# Patient Record
Sex: Female | Born: 1965 | Race: White | Hispanic: No | Smoking: Former smoker
Health system: Southern US, Community
[De-identification: ages and names within clinical notes are randomized; demographics above are authoritative.]

## PROBLEM LIST (undated history)

## (undated) DIAGNOSIS — I10 Essential (primary) hypertension: Secondary | ICD-10-CM

## (undated) DIAGNOSIS — Z86718 Personal history of other venous thrombosis and embolism: Secondary | ICD-10-CM

## (undated) DIAGNOSIS — K219 Gastro-esophageal reflux disease without esophagitis: Secondary | ICD-10-CM

## (undated) DIAGNOSIS — F32A Depression, unspecified: Secondary | ICD-10-CM

## (undated) DIAGNOSIS — J45909 Unspecified asthma, uncomplicated: Secondary | ICD-10-CM

## (undated) DIAGNOSIS — F419 Anxiety disorder, unspecified: Secondary | ICD-10-CM

## (undated) DIAGNOSIS — R918 Other nonspecific abnormal finding of lung field: Secondary | ICD-10-CM

## (undated) DIAGNOSIS — F329 Major depressive disorder, single episode, unspecified: Secondary | ICD-10-CM

## (undated) HISTORY — PX: WISDOM TOOTH EXTRACTION: SHX21

## (undated) HISTORY — PX: OTHER SURGICAL HISTORY: SHX169

## (undated) HISTORY — PX: POLYPECTOMY: SHX149

## (undated) HISTORY — PX: ABDOMINAL HYSTERECTOMY: SHX81

## (undated) HISTORY — DX: Depression, unspecified: F32.A

## (undated) HISTORY — PX: CHOLECYSTECTOMY: SHX55

## (undated) HISTORY — DX: Essential (primary) hypertension: I10

## (undated) HISTORY — DX: Other nonspecific abnormal finding of lung field: R91.8

## (undated) HISTORY — PX: ECTOPIC PREGNANCY SURGERY: SHX613

## (undated) HISTORY — PX: CARPAL TUNNEL RELEASE: SHX101

## (undated) HISTORY — PX: FOOT SURGERY: SHX648

## (undated) HISTORY — PX: TOTAL HIP ARTHROPLASTY: SHX124

## (undated) HISTORY — DX: Gastro-esophageal reflux disease without esophagitis: K21.9

## (undated) HISTORY — DX: Personal history of other venous thrombosis and embolism: Z86.718

## (undated) HISTORY — DX: Unspecified asthma, uncomplicated: J45.909

## (undated) HISTORY — DX: Anxiety disorder, unspecified: F41.9

---
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
## 2011-02-11 ENCOUNTER — Other Ambulatory Visit (HOSPITAL_COMMUNITY): Payer: Self-pay | Admitting: Neurosurgery

## 2011-02-11 ENCOUNTER — Encounter (HOSPITAL_COMMUNITY)
Admission: RE | Admit: 2011-02-11 | Discharge: 2011-02-11 | Disposition: A | Discharge status: Home / Self Care | Payer: BC Managed Care – PPO | Source: Ambulatory Visit | Attending: Neurosurgery | Admitting: Neurosurgery

## 2011-02-11 ENCOUNTER — Ambulatory Visit (HOSPITAL_COMMUNITY)
Admission: RE | Admit: 2011-02-11 | Discharge: 2011-02-11 | Disposition: A | Discharge status: Home / Self Care | Payer: BC Managed Care – PPO | Source: Ambulatory Visit | Attending: Neurosurgery | Admitting: Neurosurgery

## 2011-02-11 DIAGNOSIS — Z01818 Encounter for other preprocedural examination: Secondary | ICD-10-CM | POA: Insufficient documentation

## 2011-02-11 DIAGNOSIS — Z01810 Encounter for preprocedural cardiovascular examination: Secondary | ICD-10-CM | POA: Insufficient documentation

## 2011-02-11 DIAGNOSIS — Z01812 Encounter for preprocedural laboratory examination: Secondary | ICD-10-CM | POA: Insufficient documentation

## 2011-02-11 DIAGNOSIS — M47812 Spondylosis without myelopathy or radiculopathy, cervical region: Secondary | ICD-10-CM | POA: Insufficient documentation

## 2011-02-11 LAB — BASIC METABOLIC PANEL
BUN: 12 mg/dL (ref 6–23)
CO2: 27 mEq/L (ref 19–32)
Calcium: 9.2 mg/dL (ref 8.4–10.5)
Chloride: 101 mEq/L (ref 96–112)
Creatinine, Ser: 0.8 mg/dL (ref 0.50–1.10)
GFR calc Af Amer: >60 mL/min (ref >60)
GFR calc non Af Amer: >60 mL/min (ref >60)
Glucose, Bld: 93 mg/dL (ref 70–99)
Potassium: 3.6 mEq/L (ref 3.5–5.1)
Sodium: 138 mEq/L (ref 135–145)

## 2011-02-11 LAB — CBC
HCT: 37.6 % (ref 36.0–46.0)
Hemoglobin: 13.2 g/dL (ref 12.0–15.0)
MCH: 31.7 pg (ref 26.0–34.0)
MCHC: 35.1 g/dL (ref 30.0–36.0)
MCV: 90.4 fL (ref 78.0–100.0)
Platelets: 353 10*3/uL (ref 150–400)
RBC: 4.16 MIL/uL (ref 3.87–5.11)
RDW: 12.5 % (ref 11.5–15.5)
WBC: 7.8 10*3/uL (ref 4.0–10.5)

## 2011-02-11 LAB — HCG, SERUM, QUALITATIVE: Preg, Serum: NEGATIVE

## 2011-02-11 LAB — SURGICAL PCR SCREEN
MRSA, PCR: NEGATIVE
Staphylococcus aureus: POSITIVE — AB

## 2011-02-21 ENCOUNTER — Inpatient Hospital Stay (HOSPITAL_COMMUNITY): Payer: BC Managed Care – PPO

## 2011-02-21 ENCOUNTER — Inpatient Hospital Stay (HOSPITAL_COMMUNITY)
Admission: RE | Admit: 2011-02-21 | Discharge: 2011-02-22 | DRG: 865 | Disposition: A | Discharge status: Home / Self Care | Payer: BC Managed Care – PPO | Source: Ambulatory Visit | Attending: Neurosurgery | Admitting: Neurosurgery

## 2011-02-21 DIAGNOSIS — I10 Essential (primary) hypertension: Secondary | ICD-10-CM | POA: Diagnosis present

## 2011-02-21 DIAGNOSIS — I252 Old myocardial infarction: Secondary | ICD-10-CM

## 2011-02-21 DIAGNOSIS — K219 Gastro-esophageal reflux disease without esophagitis: Secondary | ICD-10-CM | POA: Diagnosis present

## 2011-02-21 DIAGNOSIS — M5030 Other cervical disc degeneration, unspecified cervical region: Secondary | ICD-10-CM | POA: Diagnosis present

## 2011-02-21 DIAGNOSIS — G5600 Carpal tunnel syndrome, unspecified upper limb: Secondary | ICD-10-CM | POA: Diagnosis present

## 2011-02-21 DIAGNOSIS — Z01812 Encounter for preprocedural laboratory examination: Secondary | ICD-10-CM

## 2011-02-21 DIAGNOSIS — IMO0002 Reserved for concepts with insufficient information to code with codable children: Secondary | ICD-10-CM | POA: Diagnosis present

## 2011-02-21 DIAGNOSIS — M47812 Spondylosis without myelopathy or radiculopathy, cervical region: Principal | ICD-10-CM | POA: Diagnosis present

## 2011-02-21 DIAGNOSIS — F17200 Nicotine dependence, unspecified, uncomplicated: Secondary | ICD-10-CM | POA: Diagnosis present

## 2011-02-21 DIAGNOSIS — G9741 Accidental puncture or laceration of dura during a procedure: Secondary | ICD-10-CM | POA: Diagnosis present

## 2011-02-21 DIAGNOSIS — Z8673 Personal history of transient ischemic attack (TIA), and cerebral infarction without residual deficits: Secondary | ICD-10-CM

## 2011-02-21 IMAGING — CR DG CERVICAL SPINE 2 OR 3 VIEWS
1 series · 1 of 1 positions shown · non-contrast
Comparison: None.

CLINICAL DATA: 45-year-old female undergoing cervical spine
surgery.

CERVICAL SPINE - 2-3 VIEW

[view not recorded]
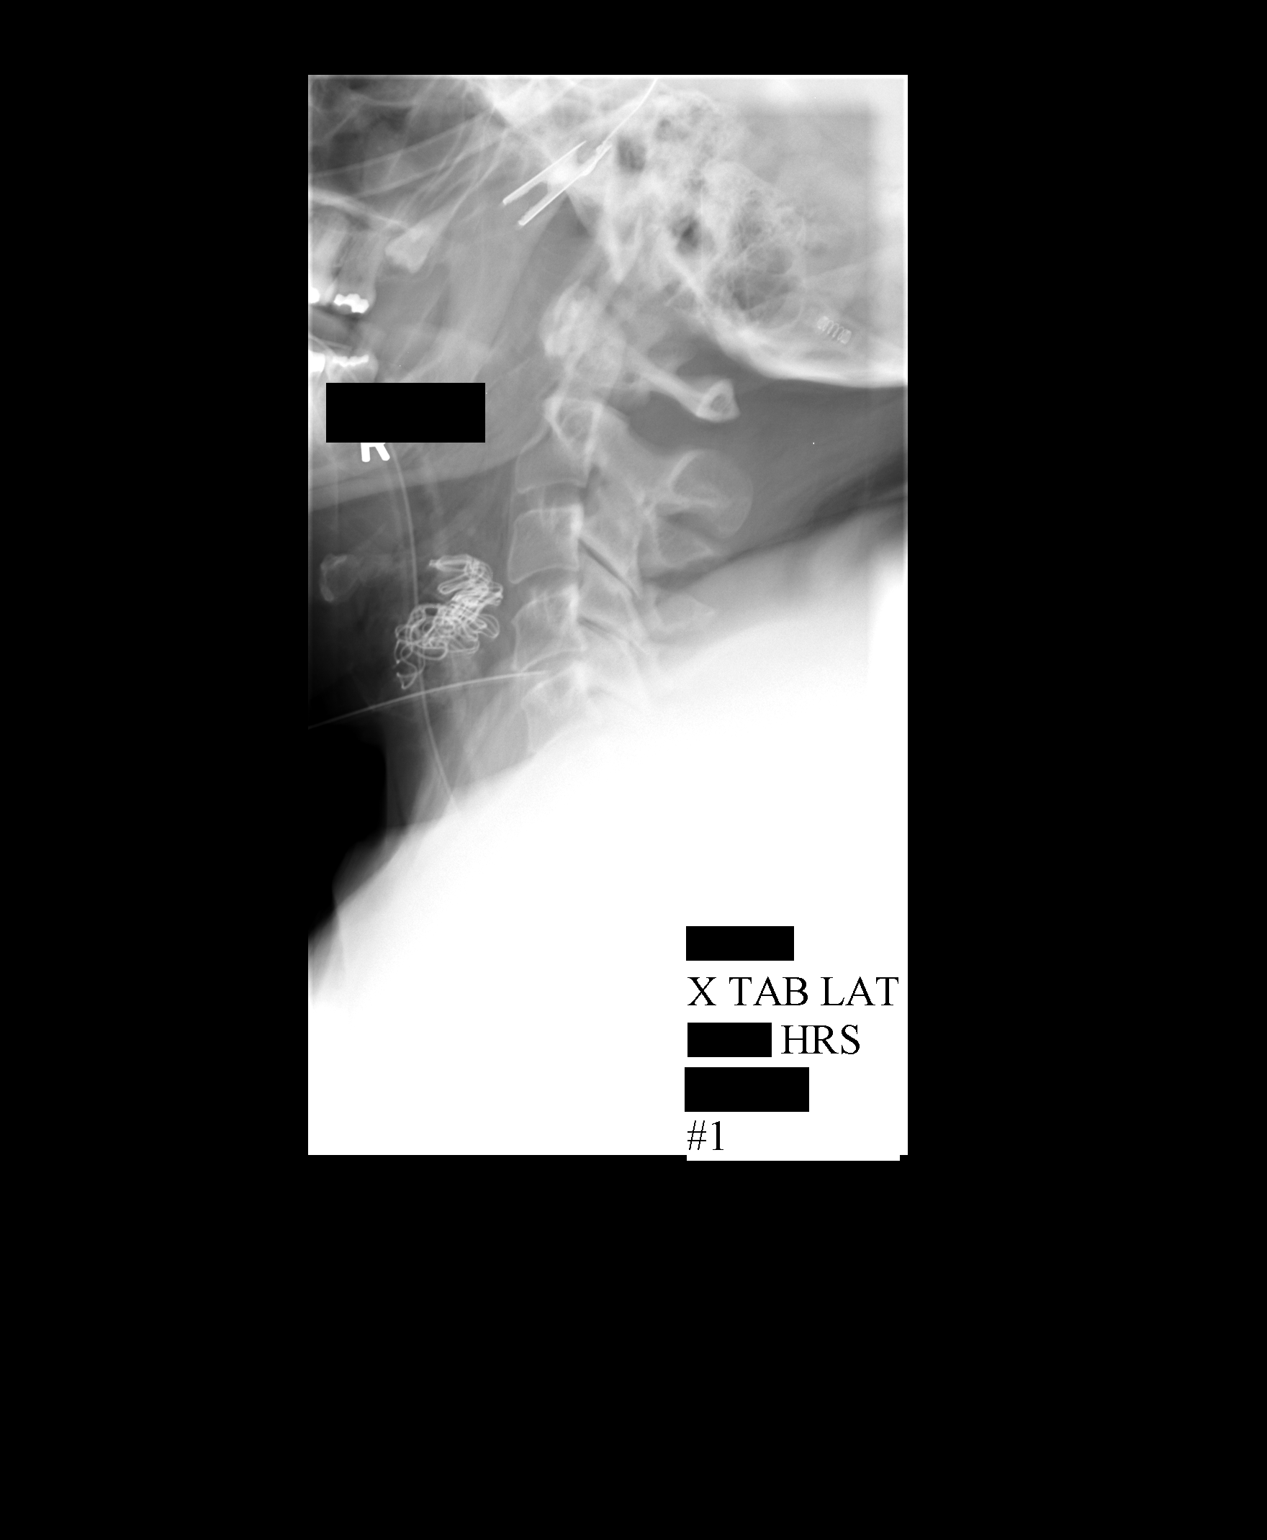

[1 of 1 positions shown; findings below may reference images not displayed]

FINDINGS: 3 intraoperative portable cross-table lateral views of
the cervical spine.

Film #1 [R1] hours.  Needle directed into the C4-C5 disc space.

Film #2 at [R1] hours.  Anterior fusion plate projects from C4
caudally below the C6 level, which is under penetrated.  Disc
spacers in place at C4-C5 and C5-C6.

Film #3 at [R1] hours.  Cortical screw is now in place with
complete ACDF hardware at C4-C5 and C5-C6.  The levels below C6 are
not penetrated.
IMPRESSION: ACDF at C4-C5 and C5-C6, and probably extending caudally to C7 but
that level is inadequately penetrated.

## 2011-02-23 ENCOUNTER — Emergency Department (HOSPITAL_COMMUNITY)
Admission: EM | Admit: 2011-02-23 | Discharge: 2011-02-23 | Disposition: A | Discharge status: Home / Self Care | Payer: BC Managed Care – PPO | Attending: Emergency Medicine | Admitting: Emergency Medicine

## 2011-02-23 DIAGNOSIS — I10 Essential (primary) hypertension: Secondary | ICD-10-CM | POA: Insufficient documentation

## 2011-02-23 DIAGNOSIS — G8918 Other acute postprocedural pain: Secondary | ICD-10-CM | POA: Insufficient documentation

## 2011-02-23 DIAGNOSIS — L2989 Other pruritus: Secondary | ICD-10-CM | POA: Insufficient documentation

## 2011-02-23 DIAGNOSIS — R6889 Other general symptoms and signs: Secondary | ICD-10-CM | POA: Insufficient documentation

## 2011-02-23 DIAGNOSIS — R220 Localized swelling, mass and lump, head: Secondary | ICD-10-CM | POA: Insufficient documentation

## 2011-02-23 DIAGNOSIS — T40605A Adverse effect of unspecified narcotics, initial encounter: Secondary | ICD-10-CM | POA: Insufficient documentation

## 2011-02-23 DIAGNOSIS — L298 Other pruritus: Secondary | ICD-10-CM | POA: Insufficient documentation

## 2011-02-26 NOTE — H&P (Signed)
  [name], [name] NO.:  [id number]  MEDICAL RECORD NO.:  [id number]  LOCATION:  2899                         FACILITY:  [facility]  PHYSICIAN:  Hilda Lias, M.D.   DATE OF BIRTH:  01-Mar-1966  DATE OF ADMISSION:  02/21/2011 DATE OF DISCHARGE:                             HISTORY & PHYSICAL   HISTORY OF PRESENT ILLNESS:  [name] is a lady who was seen by me 3 months ago complaining of neck pain radiating to both upper extremities and complains back pain treated conservatively.  Lately, she has noticed also pain in both hands.  She is quite pleasant.  She had conservative treatment including therapies, medications, and she is not any better. Because of gradual weakness and findings, she was sent to [place] for further evaluation.  PAST MEDICAL HISTORY:  She had a heart catheterization, cholecystectomy, and foot surgery.  MEDICATIONS:  Demerol and some blood pressure medications.  ALLERGIES:  She as per report denies any allergies.  SOCIAL HISTORY:  The patient does not drink.  She smokes.  She is 232 pounds and 5 feet 2 inches.  FAMILY HISTORY:  Father died at the age of 71 with acute MI.  REVIEW OF SYSTEMS:  Positive for history of MI about 3 years ago, history of back pain and blood pressure, urinary incontinence, liver cirrhosis.  PHYSICAL EXAMINATION:  HEAD, EARS, NOSE, AND THROAT:  Normal. NECK:  She has decreased flexibility. LUNGS:  Clear. HEART:  Heart sounds normal. ABDOMEN:  Normal. EXTREMITIES:  Normal pulses. NEUROLOGIC:  She has weakness of the biceps and triceps.  She has mild weakness in the deltoids and weakness in intrinsic muscles in the right side worse than the left side.  IMAGING:  A MRI showed spondylosis at the level of C4-C5, C5-C6, and C6- C7 with subluxation at C4-C5.  The nerve conduction test showed that she has carpal tunnel syndrome bilaterally, right side worse than the left side.  CLINICAL IMPRESSION:  Cervical  spondylosis, C4-C7 with bilateral carpal tunnel syndrome, right side worse than the left side.  RECOMMENDATIONS:  The patient is being admitted for surgery.  The procedure will be anterior diskectomy and fusion at the level of C4-C5, C5-C6, and C6-C7.  Then we are going to go ahead and decompress the right median nerve.  The surgery was fully explained to her including the possibility of no improvement, need for further surgery, infection, CSF leak, and paralysis.          ______________________________ Hilda Lias, M.D.     EB/MEDQ  D:  02/21/2011  T:  02/21/2011  Job:  [id number]  Electronically Signed by Hilda Lias M.D. on 02/26/2011 07:05:36 PM

## 2011-02-26 NOTE — Op Note (Signed)
[name], [name] NO.:  [id number]  MEDICAL RECORD NO.:  [id number]  LOCATION:  3009                         FACILITY:  [facility]  PHYSICIAN:  Hilda Lias, M.D.   DATE OF BIRTH:  February 18, 1966  DATE OF PROCEDURE:  02/21/2011 DATE OF DISCHARGE:                              OPERATIVE REPORT   PREOPERATIVE DIAGNOSIS:  C4-C5, C5-C6, C6-C7 spondylosis with chronic radiculopathy, bilateral weakness of the deltoid and biceps.  Bilateral carpal tunnel syndrome, right worse than left one.  POSTOPERATIVE DIAGNOSES:  C4-C5, C5-C6, C6-C7 spondylosis with chronic radiculopathy, bilateral weakness of the deltoid and biceps.  Bilateral carpal tunnel syndrome, right worse than left one.  PROCEDURES: 1. First stage anterior C4-C5, C5-C6, C6-C7 diskectomy, decompression     of the spinal cord, interbody fusion with cages and plate. 2. Decompression of the right median nerve.  Microscope.  SURGEON:  Hilda Lias, MD  ASSISTANT:  Hewitt Shorts, MD, for the cervical area.  CLINICAL HISTORY:  [name] is 44 years old complaining of neck pain with radiation to both upper extremity associated with weakness at deltoid and biceps.  Also, she had complained of numbness in both hands. MRI shows stenosis at the level of C4-C5, C5-C6, C6-C7.  Nerve conduction test showed bilateral carpal tunnel syndrome, right worse than left one.  The surgery was advised and the patient was aware of the risks of the surgery.  PROCEDURE IN DETAIL:  The patient was taken to the OR and after intubation, we had to use a quite a bit of blanket underneath the shoulder just to be able to retract the neck.  The patient is 5 feet 2 inches and 210 pounds and it was difficult to obtain a space between the head and the shoulder.  Nevertheless, after we used the blanket, we had quite acceptable space.  Then the area was cleaned with DuraPrep. Drapes were applied.  Transverse incision was made through  the skin and subcutaneous tissue.  Retraction was carried out and we went straight down to the cervical spine.  X-rays showed that indeed we were at the level of C4-C5 by x-ray.  From then on, we started dissecting and we brought the microscope.  We started at the level of C6-C7.  An incision was made after we removed a large anterior osteophyte.  Incision was carried out through the anterior ligaments.  Degenerative disk disease was removed.  We brought the microscope into the area.  We drilled the posterior element of the vertebral body of C6-C7 and we opened the posterior ligament.  Decompression of the spinal cord as well both C7 nerve root was done.  The same procedure was done essentially at the level of C5-C6 with the same findings of degenerative disk disease and narrowing.  At the end, we had a good decompression of the spinal cord at the level of C5-C6 with good space for C6 nerve root.  At the level of C4-C5, it was quite difficult.  The patient had quite a bit of degenerative disk disease and once we reached the posterior ligament, we found the ligament was completely calcified.  It was difficult to dissect the ligament away  from the spinal cord.  We opened laterally and we started dissecting toward the midline.  The ligament was also adherent to dura mater.  During the procedure, there was a small opening in the dura mater which was taken care with Duragen and Tisseel.  We continued our decompression through the midline.  Once we reached the left side, the patient has a large calcified disk which also was removed.  At the end, we had plenty of room for the spinal cord at this level as well as good space for the foramen.  Then 3 cages of 7 mm height, lordotic with local autograft as well as the DBX were inserted followed by a plate using 8 screws.  Lateral cervical spine x-rays showed good position of the cages as well as the plate.  The area was irrigated.  We waited 5  minutes just to be sure that we had good hemostasis and then the wound was closed with Vicryl and Steri-Strips. After that, the drapes were removed.  We proceeded with a second stage.  Second stage.  The right hand and arm were prepped with DuraPrep and drapes were applied to the area.  The incision followed by the patient's skin, subcutaneous tissue up to thick volar ligament was done.  Then we found the carpal ligament which was really thick and calcified.  With the knife, we opened the midline and protecting the median nerve we did decompression, proximal and distal.  Because of the thickened ligament, we had to dissect the lateral aspect of the ligament.  Hemostasis was accomplished with bipolar.  The wound was irrigated.  After that, we closed the wound using chromic stitches of 4-0 nylon.  The patient did well and she is going to go to PACU.          ______________________________ Hilda Lias, M.D.     EB/MEDQ  D:  02/21/2011  T:  02/21/2011  Job:  [id number]  Electronically Signed by Hilda Lias M.D. on 02/26/2011 07:06:58 PM

## 2011-03-07 DIAGNOSIS — R918 Other nonspecific abnormal finding of lung field: Secondary | ICD-10-CM | POA: Insufficient documentation

## 2011-03-07 DIAGNOSIS — I10 Essential (primary) hypertension: Secondary | ICD-10-CM

## 2011-03-07 DIAGNOSIS — F419 Anxiety disorder, unspecified: Secondary | ICD-10-CM | POA: Insufficient documentation

## 2011-03-11 ENCOUNTER — Ambulatory Visit (INDEPENDENT_AMBULATORY_CARE_PROVIDER_SITE_OTHER): Payer: BC Managed Care – PPO | Admitting: Thoracic Surgery

## 2011-03-11 ENCOUNTER — Encounter: Payer: Self-pay | Admitting: Thoracic Surgery

## 2011-03-11 VITALS — BP 133/92 | HR 116 | Resp 20 | Ht 62.0 in | Wt 225.0 lb

## 2011-03-11 DIAGNOSIS — R918 Other nonspecific abnormal finding of lung field: Secondary | ICD-10-CM

## 2011-03-11 DIAGNOSIS — R9389 Abnormal findings on diagnostic imaging of other specified body structures: Secondary | ICD-10-CM

## 2011-03-11 NOTE — Progress Notes (Signed)
PCP is Feliciana Rossetti, MD Referring Provider is Gordan Payment., MD  Chief Complaint  Patient presents with  . Lung Mass    referral from Dr Shary Decamp, lung mass, CT Chest super D 03/03/11    HPI: This 45 year old former smoker underwent a cervical laminectomy and fusion by Dr. Jeral Fruit. Preoperative chest x-ray showed a questionable left lower lobe lesion. CT scan was obtained through a CT scan showed no evidence of a lung mass she's had no hemoptysis fever chills or excessive sputum. We will refer back to her medical Dr. for long-term followup. No further therapy is needed.  Past Medical History  Diagnosis Date  . Anxiety   . Hypertension   . Lung mass     No past surgical history on file.  No family history on file.  Social History History  Substance Use Topics  . Smoking status: Former Smoker    Types: Cigarettes    Quit date: 06/16/2006  . Smokeless tobacco: Never Used  . Alcohol Use: Yes    Current Outpatient Prescriptions  Medication Sig Dispense Refill  . ALPRAZolam (XANAX) 0.5 MG tablet Take 0.5 mg by mouth 3 (three) times daily as needed.        Marland Kitchen amLODipine (NORVASC) 10 MG tablet Take 10 mg by mouth daily.        Marland Kitchen HYDROmorphone (DILAUDID) 4 MG tablet Take 2 mg by mouth every 4 (four) hours as needed.       . Multiple Vitamin (MULTIVITAMIN) tablet Take 1 tablet by mouth daily.        Marland Kitchen omeprazole (PRILOSEC) 40 MG capsule Take 40 mg by mouth daily.        . simvastatin (ZOCOR) 40 MG tablet Take 40 mg by mouth at bedtime.        Marland Kitchen aspirin 81 MG tablet Take 81 mg by mouth daily.          Allergies  Allergen Reactions  . Demerol Anaphylaxis  . Diltiazem   . Metoprolol Tartrate Palpitations    Review of Systems  Constitutional: Negative.   HENT: Negative.   Eyes: Negative.   Respiratory: Negative.   Cardiovascular: Negative.   Gastrointestinal: Negative.   Genitourinary: Negative.   Musculoskeletal: Positive for back pain.  Neurological: Negative for tremors,  facial asymmetry, speech difficulty, weakness, numbness and headaches.  Hematological: Negative.   Psychiatric/Behavioral: Negative.    she had a right carpal tunnel incision left neck incision. Both healing well  BP 133/92  Pulse 116  Resp 20  Ht 5\' 2"  (1.575 m)  Wt 225 lb (102.059 kg)  BMI 41.15 kg/m2  SpO2 98%  LMP 02/10/2011 Physical Exam   Diagnostic Tests CT scan showed no lung mass.   Impression: Status post cervical fusion and carpal tunnel repair on the right   Plan: Followup by PCP

## 2015-07-18 DIAGNOSIS — I429 Cardiomyopathy, unspecified: Secondary | ICD-10-CM

## 2015-07-18 DIAGNOSIS — E785 Hyperlipidemia, unspecified: Secondary | ICD-10-CM

## 2015-07-18 DIAGNOSIS — K219 Gastro-esophageal reflux disease without esophagitis: Secondary | ICD-10-CM

## 2015-07-18 HISTORY — DX: Gastro-esophageal reflux disease without esophagitis: K21.9

## 2015-07-18 HISTORY — DX: Hyperlipidemia, unspecified: E78.5

## 2015-07-18 HISTORY — DX: Cardiomyopathy, unspecified: I42.9

## 2015-07-22 DIAGNOSIS — R2243 Localized swelling, mass and lump, lower limb, bilateral: Secondary | ICD-10-CM

## 2015-07-22 DIAGNOSIS — K222 Esophageal obstruction: Secondary | ICD-10-CM

## 2015-07-22 DIAGNOSIS — J301 Allergic rhinitis due to pollen: Secondary | ICD-10-CM

## 2015-07-22 DIAGNOSIS — M5126 Other intervertebral disc displacement, lumbar region: Secondary | ICD-10-CM

## 2015-07-22 DIAGNOSIS — J45909 Unspecified asthma, uncomplicated: Secondary | ICD-10-CM | POA: Insufficient documentation

## 2015-07-22 DIAGNOSIS — M51369 Other intervertebral disc degeneration, lumbar region without mention of lumbar back pain or lower extremity pain: Secondary | ICD-10-CM

## 2015-07-22 DIAGNOSIS — G5600 Carpal tunnel syndrome, unspecified upper limb: Secondary | ICD-10-CM

## 2015-07-22 DIAGNOSIS — E782 Mixed hyperlipidemia: Secondary | ICD-10-CM

## 2015-07-22 DIAGNOSIS — R0683 Snoring: Secondary | ICD-10-CM

## 2015-07-22 DIAGNOSIS — R5382 Chronic fatigue, unspecified: Secondary | ICD-10-CM

## 2015-07-22 DIAGNOSIS — H6990 Unspecified Eustachian tube disorder, unspecified ear: Secondary | ICD-10-CM

## 2015-07-22 DIAGNOSIS — M5136 Other intervertebral disc degeneration, lumbar region: Secondary | ICD-10-CM

## 2015-07-22 DIAGNOSIS — Z79899 Other long term (current) drug therapy: Secondary | ICD-10-CM

## 2015-07-22 DIAGNOSIS — E785 Hyperlipidemia, unspecified: Secondary | ICD-10-CM

## 2015-07-22 DIAGNOSIS — H6980 Other specified disorders of Eustachian tube, unspecified ear: Secondary | ICD-10-CM

## 2015-07-22 DIAGNOSIS — J310 Chronic rhinitis: Secondary | ICD-10-CM | POA: Insufficient documentation

## 2015-07-22 DIAGNOSIS — E669 Obesity, unspecified: Secondary | ICD-10-CM | POA: Insufficient documentation

## 2015-07-22 DIAGNOSIS — N951 Menopausal and female climacteric states: Secondary | ICD-10-CM

## 2015-07-22 HISTORY — DX: Hyperlipidemia, unspecified: E78.5

## 2015-07-22 HISTORY — DX: Other intervertebral disc displacement, lumbar region: M51.26

## 2015-07-22 HISTORY — DX: Snoring: R06.83

## 2015-07-22 HISTORY — DX: Allergic rhinitis due to pollen: J30.1

## 2015-07-22 HISTORY — DX: Morbid (severe) obesity due to excess calories: E66.01

## 2015-07-22 HISTORY — DX: Other intervertebral disc degeneration, lumbar region: M51.36

## 2015-07-22 HISTORY — DX: Chronic fatigue, unspecified: R53.82

## 2015-07-22 HISTORY — DX: Other long term (current) drug therapy: Z79.899

## 2015-07-22 HISTORY — DX: Menopausal and female climacteric states: N95.1

## 2015-07-22 HISTORY — DX: Mixed hyperlipidemia: E78.2

## 2015-07-22 HISTORY — DX: Carpal tunnel syndrome, unspecified upper limb: G56.00

## 2015-07-22 HISTORY — DX: Chronic rhinitis: J31.0

## 2015-07-22 HISTORY — DX: Other intervertebral disc degeneration, lumbar region without mention of lumbar back pain or lower extremity pain: M51.369

## 2015-07-22 HISTORY — DX: Localized swelling, mass and lump, lower limb, bilateral: R22.43

## 2015-07-22 HISTORY — DX: Unspecified eustachian tube disorder, unspecified ear: H69.90

## 2015-07-22 HISTORY — DX: Other specified disorders of Eustachian tube, unspecified ear: H69.80

## 2015-07-22 HISTORY — DX: Esophageal obstruction: K22.2

## 2016-04-24 ENCOUNTER — Ambulatory Visit (INDEPENDENT_AMBULATORY_CARE_PROVIDER_SITE_OTHER): Payer: BLUE CROSS/BLUE SHIELD | Admitting: Sports Medicine

## 2016-04-24 ENCOUNTER — Encounter: Payer: Self-pay | Admitting: Sports Medicine

## 2016-04-24 DIAGNOSIS — M79671 Pain in right foot: Secondary | ICD-10-CM

## 2016-04-24 DIAGNOSIS — M7751 Other enthesopathy of right foot: Secondary | ICD-10-CM

## 2016-04-24 DIAGNOSIS — M19071 Primary osteoarthritis, right ankle and foot: Secondary | ICD-10-CM

## 2016-04-24 DIAGNOSIS — M21619 Bunion of unspecified foot: Secondary | ICD-10-CM | POA: Diagnosis not present

## 2016-04-24 DIAGNOSIS — M778 Other enthesopathies, not elsewhere classified: Secondary | ICD-10-CM

## 2016-04-24 DIAGNOSIS — M779 Enthesopathy, unspecified: Principal | ICD-10-CM

## 2016-04-24 MED ORDER — TRIAMCINOLONE ACETONIDE 10 MG/ML IJ SUSP: 10.0000 mg | Freq: Once | INTRAMUSCULAR | Status: DC

## 2016-04-24 NOTE — Progress Notes (Signed)
Subjective: [name] is a 50 y.o. female patient who presents to office for evaluation of right foot pain. Patient complains of progressive pain in the right foot that started after her knee replacement. States that it has gotten better since she has gotten wider shoes. States pain is a dull ache and rates it 5-6 out of 10. States that she also has topical arthritis medicine and topical lidocaine, she has been using with some relief. Also states that she has tried heat. Reports that her feet hurt most after a long shift at work. 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 Patient denies any other pedal complaints.   Patient Active Problem List   Diagnosis Date Noted  . Asthma 07/22/2015  . Bulging lumbar disc 07/22/2015  . Carpal tunnel syndrome 07/22/2015  . Chronic fatigue 07/22/2015  . Chronic rhinitis 07/22/2015  . Esophageal stricture 07/22/2015  . Eustachian tube dysfunction 07/22/2015  . High risk medication use 07/22/2015  . Hot flash, menopausal 07/22/2015  . Hyperlipidemia 07/22/2015  . Localized swelling of both lower legs 07/22/2015  . Morbid obesity (HCC) 07/22/2015  . Snores 07/22/2015  . Cardiomyopathy (HCC) 07/18/2015  . Gastroesophageal reflux disease without esophagitis 07/18/2015  . Lung mass   . Hypertension   . Anxiety     Current Outpatient Prescriptions on File Prior to Visit  Medication Sig Dispense Refill  . ALPRAZolam (XANAX) 0.5 MG tablet Take 0.5 mg by mouth 3 (three) times daily as needed.      Marland Kitchen. amLODipine (NORVASC) 10 MG tablet Take 10 mg by mouth daily.      Marland Kitchen. aspirin 81 MG tablet Take 81 mg by mouth daily.      Marland Kitchen. HYDROmorphone (DILAUDID) 4 MG tablet Take 2 mg by mouth every 4 (four) hours as needed.     . Multiple Vitamin (MULTIVITAMIN) tablet Take 1 tablet by mouth daily.      Marland Kitchen. omeprazole (PRILOSEC) 40 MG capsule Take 40 mg by mouth  daily.      . simvastatin (ZOCOR) 40 MG tablet Take 40 mg by mouth at bedtime.       No current facility-administered medications on file prior to visit.     Allergies  Allergen Reactions  . Demerol Anaphylaxis  . Meperidine Anaphylaxis  . Citalopram Hydrobromide Other (See Comments)    Unknown  . Hydrocodone-Acetaminophen Other (See Comments)    Unknown  . Metoprolol Tartrate Other (See Comments)    Chest discomfort, palpations  . Oxycodone-Acetaminophen Other (See Comments)    Unknown  . Diltiazem Rash  . Metoprolol Tartrate Palpitations    Objective:  General: Alert and oriented x3 in no acute distress  Dermatology: Tattoos, No open lesions bilateral lower extremities, no webspace macerations, no ecchymosis bilateral, all nails x 10 are well manicured. There is mild onychomycosis at the left hallux lateral nail bed with no acute signs of infection.  Vascular: Dorsalis Pedis and Posterior Tibial pedal pulses palpable, Capillary Fill Time 3 seconds,(+) pedal hair growth bilateral, no edema bilateral lower extremities, Temperature gradient within normal limits.  Neurology: Michaell CowingGross sensation intact via light touch bilateral. (- )Tinels sign bilateral.   Musculoskeletal: Mild tenderness with palpation at dorsal lateral right foot, with significant palpable bony prominences dorsal midfoot bilateral,No pain with calf compression bilateral. There is decreased ankle rom with knee extending  vs flexed resembling gastroc equnius  bilateral, Subtalar joint range of motion is within normal limits, there is no 1st ray hypermobility noted bilateral, decreased 1st MPJ rom Right>Left with functional limitus and significant bunion and tailor's bunion noted on weightbearing exam. Strength within normal limits in all groups bilateral.   Assessment and Plan: Problem List Items Addressed This Visit    None    Visit Diagnoses    Capsulitis of foot, right    -  Primary   Relevant Medications    triamcinolone acetonide (KENALOG) 10 MG/ML injection 10 mg (Start on 04/24/2016 11:45 AM)   Osteoarthritis of right ankle and foot       Relevant Medications   Ibuprofen-Famotidine (DUEXIS) 800-26.6 MG TABS   meloxicam (MOBIC) 15 MG tablet   meloxicam (MOBIC) 15 MG tablet   traMADol (ULTRAM) 50 MG tablet   triamcinolone acetonide (KENALOG) 10 MG/ML injection 10 mg (Start on 04/24/2016 11:45 AM)   Right foot pain       Relevant Medications   triamcinolone acetonide (KENALOG) 10 MG/ML injection 10 mg (Start on 04/24/2016 11:45 AM)   Bunion           -Complete examination performed -Xrays reviewed From [facility] medicine revealing significant bunion deformity. Significant midfoot arthritis and enlarged calcaneal heel spur.  -Discussed treatement options right midfoot arthritis -After oral consent and aseptic prep, injected a mixture containing 1 ml of 2%  plain lidocaine, 1 ml 0.5% plain marcaine, 0.5 ml of kenalog 10 and 0.5 ml of dexamethasone phosphate into right midfoot at dorsal lateral aspect at area of most tenderness without complication. Post-injection care discussed with patient.  -Recommend patient to continue with topical pain cream -Recommend patient to continue with meloxicam which she already has -Recommend patient to continue with wide good supportive shoes -Recommend heat followed by ice -Recommend rest and elevation -Patient to return to office as needed or sooner if condition worsens.  Asencion Islam, DPM

## 2016-04-24 NOTE — Patient Instructions (Signed)
Osteoarthritis Osteoarthritis is a disease that causes soreness and inflammation of a joint. It occurs when the cartilage at the affected joint wears down. Cartilage acts as a cushion, covering the ends of bones where they meet to form a joint. Osteoarthritis is the most common form of arthritis. It often occurs in older people. The joints affected most often by this condition include those in the:  Ends of the fingers.  Thumbs.  Neck.  Lower back.  Knees.  Hips.  Feet CAUSES  Over time, the cartilage that covers the ends of bones begins to wear away. This causes bone to rub on bone, producing pain and stiffness in the affected joints.  RISK FACTORS Certain factors can increase your chances of having osteoarthritis, including:  Older age.  Excessive body weight.  Overuse of joints.  Previous joint injury. SIGNS AND SYMPTOMS   Pain, swelling, and stiffness in the joint.  Over time, the joint may lose its normal shape.  Small deposits of bone (osteophytes) may grow on the edges of the joint.  Bits of bone or cartilage can break off and float inside the joint space. This may cause more pain and damage. DIAGNOSIS  Your health care provider will do a physical exam and ask about your symptoms. Various tests may be ordered, such as:  X-rays of the affected joint.  Possible Blood tests to rule out other types of arthritis. Additional tests may be used to diagnose your condition. TREATMENT  Goals of treatment are to control pain and improve joint function. Treatment plans may include:  A prescribed exercise program that allows for rest and joint relief.  A weight control plan.  Pain relief techniques, such as:  Properly applied heat and cold.  Electric pulses delivered to nerve endings under the skin (transcutaneous electrical nerve stimulation [TENS]).  Massage.  Certain nutritional supplements.  Medicines to control pain, such  as:  Acetaminophen.  Nonsteroidal anti-inflammatory drugs (NSAIDs), such as naproxen.  Narcotic or central-acting agents, such as tramadol.  Corticosteroids. These can be given orally or as an injection.  Surgery to reposition the bones and relieve pain (osteotomy) or to remove loose pieces of bone and cartilage. Joint replacement may be needed in advanced states of osteoarthritis. HOME CARE INSTRUCTIONS   Take medicines only as directed by your health care provider.  Maintain a healthy weight. Follow your health care provider's instructions for weight control. This may include dietary instructions.  Exercise as directed. Your health care provider can recommend specific types of exercise. These may include:  Strengthening exercises. These are done to strengthen the muscles that support joints affected by arthritis. They can be performed with weights or with exercise bands to add resistance.  Aerobic activities. These are exercises, such as brisk walking or low-impact aerobics, that get your heart pumping.  Range-of-motion activities. These keep your joints limber.  Balance and agility exercises. These help you maintain daily living skills.  Rest your affected joints as directed by your health care provider.  Keep all follow-up visits as directed by your health care provider. SEEK MEDICAL CARE IF:   Your skin turns red.  You develop a rash in addition to your joint pain.  You have worsening joint pain.  You have a fever along with joint or muscle aches. SEEK IMMEDIATE MEDICAL CARE IF:  You have a significant loss of weight or appetite.  You have night sweats. FOR MORE INFORMATION   National Institute of Arthritis and Musculoskeletal and Skin Diseases: www.niams.http://www.myers.net/nih.gov  [employer] on Aging: https://walker.com/  American College of Rheumatology: www.rheumatology.org   This information is not intended to replace advice given to you by your health care provider.  Make sure you discuss any questions you have with your health care provider.   Document Released: 06/02/2005 Document Revised: 06/23/2014 Document Reviewed: 02/07/2013 Elsevier Interactive Patient Education [employer]

## 2016-05-15 DIAGNOSIS — M150 Primary generalized (osteo)arthritis: Secondary | ICD-10-CM

## 2016-05-15 DIAGNOSIS — M159 Polyosteoarthritis, unspecified: Secondary | ICD-10-CM | POA: Insufficient documentation

## 2016-05-15 HISTORY — DX: Primary generalized (osteo)arthritis: M15.0

## 2016-09-03 ENCOUNTER — Encounter: Payer: Self-pay | Admitting: Sports Medicine

## 2016-09-03 ENCOUNTER — Ambulatory Visit (INDEPENDENT_AMBULATORY_CARE_PROVIDER_SITE_OTHER): Payer: BLUE CROSS/BLUE SHIELD | Admitting: Sports Medicine

## 2016-09-03 DIAGNOSIS — M779 Enthesopathy, unspecified: Secondary | ICD-10-CM

## 2016-09-03 DIAGNOSIS — M778 Other enthesopathies, not elsewhere classified: Secondary | ICD-10-CM

## 2016-09-03 DIAGNOSIS — M19071 Primary osteoarthritis, right ankle and foot: Secondary | ICD-10-CM

## 2016-09-03 DIAGNOSIS — M79671 Pain in right foot: Secondary | ICD-10-CM

## 2016-09-03 DIAGNOSIS — M7751 Other enthesopathy of right foot: Secondary | ICD-10-CM | POA: Diagnosis not present

## 2016-09-03 MED ORDER — TRIAMCINOLONE ACETONIDE 40 MG/ML IJ SUSP: 20.0000 mg | Freq: Once | INTRAMUSCULAR | Status: DC

## 2016-09-03 NOTE — Progress Notes (Signed)
Subjective: [name] is a 51 y.o. female patient who returns to office for follow-up evaluation of right foot pain. Patient states that injection back in November helped. However, starting to get more pain and symptoms over the bone spur at the top and side of her right foot. Patient desires another injection to area. Patient denies any other pedal complaints.   Patient Active Problem List   Diagnosis Date Noted  . Asthma 07/22/2015  . Bulging lumbar disc 07/22/2015  . Carpal tunnel syndrome 07/22/2015  . Chronic fatigue 07/22/2015  . Chronic rhinitis 07/22/2015  . Esophageal stricture 07/22/2015  . Eustachian tube dysfunction 07/22/2015  . High risk medication use 07/22/2015  . Hot flash, menopausal 07/22/2015  . Hyperlipidemia 07/22/2015  . Localized swelling of both lower legs 07/22/2015  . Morbid obesity (HCC) 07/22/2015  . Snores 07/22/2015  . Cardiomyopathy (HCC) 07/18/2015  . Gastroesophageal reflux disease without esophagitis 07/18/2015  . Lung mass   . Hypertension   . Anxiety     Current Outpatient Prescriptions on File Prior to Visit  Medication Sig Dispense Refill  . ALPRAZolam (XANAX) 0.5 MG tablet Take 0.5 mg by mouth 3 (three) times daily as needed.      Marland Kitchen amLODipine (NORVASC) 10 MG tablet Take 10 mg by mouth daily.      Marland Kitchen amLODipine (NORVASC) 5 MG tablet     . aspirin 81 MG tablet Take 81 mg by mouth daily.      Marland Kitchen esomeprazole (NEXIUM) 20 MG packet Take 20 mg by mouth.    . fluticasone (FLONASE) 50 MCG/ACT nasal spray USE 2 SPRAYS IN EACH NOSTRIL ONCE DAILY    . furosemide (LASIX) 20 MG tablet     . HYDROmorphone (DILAUDID) 4 MG tablet Take 2 mg by mouth every 4 (four) hours as needed.     . Ibuprofen-Famotidine (DUEXIS) 800-26.6 MG TABS Take 1 (one) Tablet by mouth three times daily    . losartan (COZAAR) 50 MG tablet Take 50 mg by mouth.    . meloxicam (MOBIC) 15 MG tablet     . meloxicam (MOBIC) 15 MG tablet Take 15 mg by mouth.    . Multiple Vitamin  (MULTIVITAMIN) tablet Take 1 tablet by mouth daily.      . nitroGLYCERIN (NITROSTAT) 0.4 MG SL tablet Place 0.4 mg under the tongue.    Marland Kitchen omeprazole (PRILOSEC) 40 MG capsule Take 40 mg by mouth daily.      Marland Kitchen PROAIR HFA 108 (90 Base) MCG/ACT inhaler     . sertraline (ZOLOFT) 100 MG tablet Take 1 1/2 tablet daily    . sertraline (ZOLOFT) 100 MG tablet     . simvastatin (ZOCOR) 40 MG tablet Take 40 mg by mouth at bedtime.      . traMADol (ULTRAM) 50 MG tablet Take 50 mg by mouth.    . triamcinolone cream (KENALOG) 0.1 % Apply to affected area bid as needed    . triamcinolone cream (KENALOG) 0.1 %      Current Facility-Administered Medications on File Prior to Visit  Medication Dose Route Frequency Provider Last Rate Last Dose  . triamcinolone acetonide (KENALOG) 10 MG/ML injection 10 mg  10 mg Other Once Asencion Islam, DPM        Allergies  Allergen Reactions  . Demerol Anaphylaxis  . Meperidine Anaphylaxis  . Sulfamethoxazole Itching    With redness to skin   . Citalopram Hydrobromide Other (See Comments)    Unknown  . Hydrocodone   .  Hydrocodone-Acetaminophen Other (See Comments)    Unknown  . Metoprolol Tartrate Other (See Comments)    Chest discomfort, palpations  . Oxycodone-Acetaminophen Other (See Comments)    Unknown  . Diltiazem Rash  . Metoprolol Tartrate Palpitations    Objective:  General: Alert and oriented x3 in no acute distress  Dermatology: Tattoos, No open lesions bilateral lower extremities, no webspace macerations, no ecchymosis bilateral, all nails x 10 are well manicured with no acute signs of infection.  Vascular: Dorsalis Pedis and Posterior Tibial pedal pulses palpable, Capillary Fill Time 3 seconds,(+) pedal hair growth bilateral, no edema bilateral lower extremities, Temperature gradient within normal limits.  Neurology: Michaell Cowing sensation intact via light touch bilateral. (- )Tinels sign bilateral.   Musculoskeletal: Mild tenderness with palpation at  dorsal lateral right foot, with significant palpable bony prominences dorsal midfoot bilateral,No pain with calf compression bilateral. There is decreased ankle rom with knee extending  vs flexed resembling gastroc equnius bilateral, Subtalar joint range of motion is within normal limits, there is no 1st ray hypermobility noted bilateral, decreased 1st MPJ rom Right>Left with functional limitus and significant bunion and tailor's bunion noted on weightbearing exam. Strength within normal limits in all groups bilateral.   Assessment and Plan: Problem List Items Addressed This Visit    None    Visit Diagnoses    Osteoarthritis of right ankle and foot    -  Primary   Relevant Medications   triamcinolone acetonide (KENALOG-40) injection 20 mg (Start on 09/03/2016  7:30 PM)   Capsulitis of foot, right       Relevant Medications   triamcinolone acetonide (KENALOG-40) injection 20 mg (Start on 09/03/2016  7:30 PM)   Right foot pain       Relevant Medications   triamcinolone acetonide (KENALOG-40) injection 20 mg (Start on 09/03/2016  7:30 PM)       -Complete examination performed -Discussed treatement options and continued care for right midfoot arthritis. Encouraged patient to consider surgery if continues to be bothersome -After oral consent and aseptic prep, injected a mixture containing 1 ml of 2% plain lidocaine, 1 ml 0.5% plain marcaine, 0.5 ml of kenalog 40 and 0.5 ml of dexamethasone phosphate into right midfoot at dorsal lateral aspect at area of most tenderness without complication. Post-injection care discussed with patient.  -Recommend patient to continue with topical pain cream -Recommend patient to continue with meloxicam which she already has -Recommend patient to continue with wide good supportive shoes -Recommend heat followed by ice -Recommend rest and elevation -Patient to return to office as needed or sooner if condition worsens.  Asencion Islam, DPM

## 2016-10-15 ENCOUNTER — Encounter: Payer: Self-pay | Admitting: Sports Medicine

## 2016-10-15 ENCOUNTER — Ambulatory Visit (INDEPENDENT_AMBULATORY_CARE_PROVIDER_SITE_OTHER): Payer: BLUE CROSS/BLUE SHIELD | Admitting: Sports Medicine

## 2016-10-15 DIAGNOSIS — M19071 Primary osteoarthritis, right ankle and foot: Secondary | ICD-10-CM | POA: Diagnosis not present

## 2016-10-15 DIAGNOSIS — M779 Enthesopathy, unspecified: Principal | ICD-10-CM

## 2016-10-15 DIAGNOSIS — M7751 Other enthesopathy of right foot: Secondary | ICD-10-CM | POA: Diagnosis not present

## 2016-10-15 DIAGNOSIS — M898X9 Other specified disorders of bone, unspecified site: Secondary | ICD-10-CM

## 2016-10-15 DIAGNOSIS — M21619 Bunion of unspecified foot: Secondary | ICD-10-CM

## 2016-10-15 DIAGNOSIS — M778 Other enthesopathies, not elsewhere classified: Secondary | ICD-10-CM

## 2016-10-15 MED ORDER — TRIAMCINOLONE ACETONIDE 10 MG/ML IJ SUSP: 10.0000 mg | Freq: Once | INTRAMUSCULAR | Status: DC

## 2016-10-15 NOTE — Progress Notes (Signed)
Subjective: [name] is a 51 y.o. female patient who returns to office for follow-up evaluation of right foot pain. Patient states that injection at the top of her foot did not help. Also has pain now at her second and third toes on the right foot. Reports that she has been working more and has been wearing flat shoes with increased pain and symptoms. Patient desires surgery. Patient denies any other pedal complaints.   Patient Active Problem List   Diagnosis Date Noted  . Asthma 07/22/2015  . Bulging lumbar disc 07/22/2015  . Carpal tunnel syndrome 07/22/2015  . Chronic fatigue 07/22/2015  . Chronic rhinitis 07/22/2015  . Esophageal stricture 07/22/2015  . Eustachian tube dysfunction 07/22/2015  . High risk medication use 07/22/2015  . Hot flash, menopausal 07/22/2015  . Hyperlipidemia 07/22/2015  . Localized swelling of both lower legs 07/22/2015  . Morbid obesity (HCC) 07/22/2015  . Snores 07/22/2015  . Cardiomyopathy (HCC) 07/18/2015  . Gastroesophageal reflux disease without esophagitis 07/18/2015  . Lung mass   . Hypertension   . Anxiety     Current Outpatient Prescriptions on File Prior to Visit  Medication Sig Dispense Refill  . ALPRAZolam (XANAX) 0.5 MG tablet Take 0.5 mg by mouth 3 (three) times daily as needed.      Marland Kitchen amLODipine (NORVASC) 10 MG tablet Take 10 mg by mouth daily.      Marland Kitchen amLODipine (NORVASC) 5 MG tablet     . aspirin 81 MG tablet Take 81 mg by mouth daily.      Marland Kitchen esomeprazole (NEXIUM) 20 MG packet Take 20 mg by mouth.    . fluticasone (FLONASE) 50 MCG/ACT nasal spray USE 2 SPRAYS IN EACH NOSTRIL ONCE DAILY    . furosemide (LASIX) 20 MG tablet     . HYDROmorphone (DILAUDID) 4 MG tablet Take 2 mg by mouth every 4 (four) hours as needed.     . Ibuprofen-Famotidine (DUEXIS) 800-26.6 MG TABS Take 1 (one) Tablet by mouth three times daily    . losartan (COZAAR) 50 MG tablet Take 50 mg by mouth.    . meloxicam (MOBIC) 15 MG tablet     . meloxicam (MOBIC)  15 MG tablet Take 15 mg by mouth.    . Multiple Vitamin (MULTIVITAMIN) tablet Take 1 tablet by mouth daily.      . nitroGLYCERIN (NITROSTAT) 0.4 MG SL tablet Place 0.4 mg under the tongue.    Marland Kitchen omeprazole (PRILOSEC) 40 MG capsule Take 40 mg by mouth daily.      Marland Kitchen PROAIR HFA 108 (90 Base) MCG/ACT inhaler     . sertraline (ZOLOFT) 100 MG tablet Take 1 1/2 tablet daily    . sertraline (ZOLOFT) 100 MG tablet     . simvastatin (ZOCOR) 40 MG tablet Take 40 mg by mouth at bedtime.      . traMADol (ULTRAM) 50 MG tablet Take 50 mg by mouth.    . triamcinolone cream (KENALOG) 0.1 % Apply to affected area bid as needed    . triamcinolone cream (KENALOG) 0.1 %      Current Facility-Administered Medications on File Prior to Visit  Medication Dose Route Frequency Provider Last Rate Last Dose  . triamcinolone acetonide (KENALOG) 10 MG/ML injection 10 mg  10 mg Other Once [employer], DPM      . triamcinolone acetonide (KENALOG-40) injection 20 mg  20 mg Other Once Asencion Islam, DPM        Allergies  Allergen Reactions  .  Demerol Anaphylaxis  . Meperidine Anaphylaxis  . Sulfamethoxazole Itching    With redness to skin   . Citalopram Hydrobromide Other (See Comments)    Unknown  . Hydrocodone   . Hydrocodone-Acetaminophen Other (See Comments)    Unknown  . Metoprolol Tartrate Other (See Comments)    Chest discomfort, palpations  . Oxycodone-Acetaminophen Other (See Comments)    Unknown  . Diltiazem Rash  . Metoprolol Tartrate Palpitations    Objective:  General: Alert and oriented x3 in no acute distress  Dermatology: Tattoos, No open lesions bilateral lower extremities, no webspace macerations, no ecchymosis bilateral, all nails x 10 are well manicured with no acute signs of infection.  Vascular: Dorsalis Pedis and Posterior Tibial pedal pulses palpable, Capillary Fill Time 3 seconds,(+) pedal hair growth bilateral, no edema bilateral lower extremities, Temperature gradient within  normal limits.  Neurology: Michaell Cowing sensation intact via light touch bilateral. (- )Tinels sign bilateral.   Musculoskeletal: Mild tenderness with palpation at dorsalAnd lateral midfoot on right foot, with significant palpable bony prominences dorsal midfoot bilateral,No pain with calf compression bilateral. There is decreased ankle rom with knee extending  vs flexed resembling gastroc equnius bilateral, Subtalar joint range of motion is within normal limits, there is no 1st ray hypermobility noted bilateral, decreased 1st MPJ rom Right>Left with functional limitus and significant bunion and tailor's bunion noted on weightbearing exam, there is pain on today's exam at the bases of the second and third toes on right. Strength within normal limits in all groups bilateral.   Assessment and Plan: Problem List Items Addressed This Visit    None    Visit Diagnoses    Capsulitis of foot, right    -  Primary   Relevant Medications   triamcinolone acetonide (KENALOG) 10 MG/ML injection 10 mg   Other Relevant Orders   DME Other see comment   Osteoarthritis of right ankle and foot       Relevant Medications   triamcinolone acetonide (KENALOG) 10 MG/ML injection 10 mg   Other Relevant Orders   DME Other see comment   Bony exostosis       Bunion       Relevant Orders   DME Other see comment       -Complete examination performed -Discussed treatement options and continued care for right midfoot arthritisWith bunion and capsulitis -After oral consent and aseptic prep, injected a mixture containing 1 ml of 2% plain lidocaine, 1 ml 0.5% plain marcaine, 0.5 ml of kenalog 40 and 0.5 ml of dexamethasone phosphate into right at area of most tenderness at second and third toes without complication. Post-injection care discussed with patient.  -Patient opt for surgical management. Consent obtained for right foot removal of bone spurs with fusion across the tarsometatarsal joint using staple, Austin bunionectomy  with internal fixation, tailor's bunionectomy with internal fixation, and capsulotomy, right second and third metatarsal phalangeal joints. Pre and Post op course explained. Risks, benefits, alternatives explained. No guarantees given or implied. Surgical booking slip submitted and provided patient with Surgical packet and info for [facility] surgical center. -Dispensed CAM Dan Humphreys to use post op and knee scooter -Recommend patient to continue with topical pain cream -Recommend patient to continue with meloxicam which she already has -Recommend patient to continue with wide good supportive shoes -Recommend heat followed by ice -Recommend rest and elevation -Patient to return to office after surgery or sooner if condition worsens.  Asencion Islam, DPM

## 2016-10-15 NOTE — Patient Instructions (Signed)
Pre-Operative Instructions  Congratulations, you have decided to take an important step to improving your quality of life.  You can be assured that the doctors of [facility] will be with you every step of the way.  1. Plan to be at the surgery center/hospital at least 1 (one) hour prior to your scheduled time unless otherwise directed by the surgical center/hospital staff.  You must have a responsible adult accompany you, remain during the surgery and drive you home.  Make sure you have directions to the surgical center/hospital and know how to get there on time. 2. For hospital based surgery you will need to obtain a history and physical form from your family physician within 1 month prior to the date of surgery- we will give you a form for you primary physician.  3. We make every effort to accommodate the date you request for surgery.  There are however, times where surgery dates or times have to be moved.  We will contact you as soon as possible if a change in schedule is required.   4. No Aspirin/Ibuprofen for one week before surgery.  If you are on aspirin, any non-steroidal anti-inflammatory medications (Mobic, Aleve, Ibuprofen) you should stop taking it 7 days prior to your surgery.  You make take Tylenol  For pain prior to surgery.  5. Medications- If you are taking daily heart and blood pressure medications, seizure, reflux, allergy, asthma, anxiety, pain or diabetes medications, make sure the surgery center/hospital is aware before the day of surgery so they may notify you which medications to take or avoid the day of surgery. 6. No food or drink after midnight the night before surgery unless directed otherwise by surgical center/hospital staff. 7. No alcoholic beverages 24 hours prior to surgery.  No smoking 24 hours prior to or 24 hours after surgery. 8. Wear loose pants or shorts- loose enough to fit over bandages, boots, and casts. 9. No slip on shoes, sneakers are best. 10. Bring  your boot with you to the surgery center/hospital.  Also bring crutches or a walker if your physician has prescribed it for you.  If you do not have this equipment, it will be provided for you after surgery. 11. If you have not been contracted by the surgery center/hospital by the day before your surgery, call to confirm the date and time of your surgery. 12. Leave-time from work may vary depending on the type of surgery you have.  Appropriate arrangements should be made prior to surgery with your employer. 13. Prescriptions will be provided immediately following surgery by your doctor.  Have these filled as soon as possible after surgery and take the medication as directed. 14. Remove nail polish on the operative foot. 15. Wash the night before surgery.  The night before surgery wash the foot and leg well with the antibacterial soap provided and water paying special attention to beneath the toenails and in between the toes.  Rinse thoroughly with water and dry well with a towel.  Perform this wash unless told not to do so by your physician.  Enclosed: 1 Ice pack (please put in freezer the night before surgery)   1 Hibiclens skin cleaner   Pre-op Instructions  If you have any questions regarding the instructions, do not hesitate to call our office.  [employer]: [address] [place], [place] [address] [phone]  [place]: [address] [place], [place] [address] [phone]  [place]: [address]  ,  [address] [phone]   Dr.   Norman Regal DPM, Dr. Matthew Wagoner DPM, Dr. M. Todd Hyatt DPM, Dr. Jenavi Beedle DPM 

## 2016-10-16 ENCOUNTER — Encounter: Payer: Self-pay | Admitting: Sports Medicine

## 2016-10-16 ENCOUNTER — Telehealth: Payer: Self-pay | Admitting: *Deleted

## 2016-10-16 NOTE — Telephone Encounter (Signed)
"  I need to schedule my foot surgery with Dr. Marylene Land in [place].  Give me a call back."  I attempted to return her call.  I could not leave a message because her mailbox was full.

## 2016-10-17 NOTE — Telephone Encounter (Signed)
"  I'm calling to schedule my surgery."  Do you have a date in mind?  She does surgeries on Mondays.  "I can't do it next week but I can do it on the following week."  I will get it scheduled for 10/27/2016.  Someone from the surgical center will give you a call with the arrival time the Friday before.

## 2016-10-21 ENCOUNTER — Telehealth: Payer: Self-pay | Admitting: *Deleted

## 2016-10-21 NOTE — Telephone Encounter (Signed)
I left patient a message to call me back.  I need to reschedule her surgery to 11/03/2016.

## 2016-10-22 NOTE — Telephone Encounter (Signed)
I attempted to call patient and reschedule surgery date.  I need to move her surgery from 10/27/2016 to 11/03/2016.

## 2016-10-23 ENCOUNTER — Telehealth: Payer: Self-pay | Admitting: Sports Medicine

## 2016-10-23 ENCOUNTER — Encounter: Payer: Self-pay | Admitting: Sports Medicine

## 2016-10-23 NOTE — Telephone Encounter (Signed)
Cheryl sent FMLA paperwork to [name] to be completed. We can write her out for 12 weeks this will include the time needed for post op recovery -Dr. Marylene Land

## 2016-10-23 NOTE — Telephone Encounter (Signed)
I am calling to see if we can reschedule your surgery to 11/03/2016.  "That is fine.  However, I need to see if Dr. Marylene Land can go ahead and write me out of work.  My foot is killing me.  I stand on my job all day long because I work in [employer] ge[employer].  I got medical clearance from my Cardiologist and I had my paperwork filled out so I am ready for surgery."  I will send Dr. Marylene Land a message and I will call you back and let you know what she says.  "I am going to stop by your office today and pay what I owe.  I can get a note then."  Okay, I will send her a message.

## 2016-10-23 NOTE — Telephone Encounter (Signed)
Pt. Wanted to speak with you.

## 2016-10-28 ENCOUNTER — Telehealth: Payer: Self-pay | Admitting: *Deleted

## 2016-10-28 NOTE — Telephone Encounter (Signed)
I'm calling to see if you have any questions.  "What time will I need to be there for my surgery?  How much will I owe the doctor?"  Someone from the surgical center will call you with the arrival time probably on Friday.  I will ask Chip Boer to call you with an estimate.  "That will be great, thank you."  [name] Bunionectomy, Tailors Bunionectomy, Capsulotomy MPJ Release 2,3, Tarsal Exostectomy and TMT Joint Fusion Rt Foot) ---  2.5 hrs

## 2016-10-29 DIAGNOSIS — R52 Pain, unspecified: Secondary | ICD-10-CM

## 2016-11-03 ENCOUNTER — Encounter: Payer: Self-pay | Admitting: Sports Medicine

## 2016-11-03 DIAGNOSIS — M13871 Other specified arthritis, right ankle and foot: Secondary | ICD-10-CM

## 2016-11-03 DIAGNOSIS — M7751 Other enthesopathy of right foot: Secondary | ICD-10-CM

## 2016-11-03 DIAGNOSIS — M2011 Hallux valgus (acquired), right foot: Secondary | ICD-10-CM | POA: Diagnosis not present

## 2016-11-03 DIAGNOSIS — M25774 Osteophyte, right foot: Secondary | ICD-10-CM

## 2016-11-04 ENCOUNTER — Telehealth: Payer: Self-pay | Admitting: Sports Medicine

## 2016-11-04 NOTE — Telephone Encounter (Signed)
Post op check phone call made to patient. Patient reports that her block has wore off at the top of her foot otherwise has minor pain that is relieved with Dilaudid of which she is cutting in 1/2. Patient reports that she is resting, icing, and elevating as instructed and denies any other acute symptoms. Patient to follow up as scheduled in office on next week for continued post op care. -Dr. Marylene Land

## 2016-11-06 NOTE — Progress Notes (Signed)
DOS [id number] Right removal of bone spurs at top of foot,and possible staple right 1st and 5th toe joint bunionectomy with possible screws release inflammed capsule / joint at 2nd and 3rd toe joints on right

## 2016-11-12 ENCOUNTER — Ambulatory Visit (INDEPENDENT_AMBULATORY_CARE_PROVIDER_SITE_OTHER): Payer: BLUE CROSS/BLUE SHIELD

## 2016-11-12 ENCOUNTER — Ambulatory Visit (INDEPENDENT_AMBULATORY_CARE_PROVIDER_SITE_OTHER): Payer: Self-pay | Admitting: Sports Medicine

## 2016-11-12 DIAGNOSIS — M7751 Other enthesopathy of right foot: Secondary | ICD-10-CM

## 2016-11-12 DIAGNOSIS — M19071 Primary osteoarthritis, right ankle and foot: Secondary | ICD-10-CM

## 2016-11-12 DIAGNOSIS — M79671 Pain in right foot: Secondary | ICD-10-CM

## 2016-11-12 DIAGNOSIS — Z9889 Other specified postprocedural states: Secondary | ICD-10-CM

## 2016-11-12 DIAGNOSIS — M21619 Bunion of unspecified foot: Secondary | ICD-10-CM

## 2016-11-12 DIAGNOSIS — M778 Other enthesopathies, not elsewhere classified: Secondary | ICD-10-CM

## 2016-11-12 DIAGNOSIS — M898X9 Other specified disorders of bone, unspecified site: Secondary | ICD-10-CM

## 2016-11-12 DIAGNOSIS — M779 Enthesopathy, unspecified: Secondary | ICD-10-CM

## 2016-11-12 MED ORDER — AMOXICILLIN-POT CLAVULANATE 875-125 MG PO TABS: 1.0000 | ORAL_TABLET | Freq: Two times a day (BID) | ORAL | 0 refills | Status: DC

## 2016-11-12 MED ORDER — HYDROMORPHONE HCL 4 MG PO TABS: 4.0000 mg | ORAL_TABLET | Freq: Four times a day (QID) | ORAL | 0 refills | Status: DC | PRN

## 2016-11-12 NOTE — Progress Notes (Signed)
Subjective: [name] is a 51 y.o. female patient seen today in office for POV #1 (DOS 11-03-16), S/P Right austin bunionectomy, 2-3 capsulotomy, TMTJ fusion, 5th tailors bunion. Patient admits pain at surgical site that's getting better, denies calf pain, denies headache, chest pain, shortness of breath, nausea, vomiting, fever, or chills. Patient states that she is staying off foot with knee scooter. No other issues noted.   Patient Active Problem List   Diagnosis Date Noted  . Asthma 07/22/2015  . Bulging lumbar disc 07/22/2015  . Carpal tunnel syndrome 07/22/2015  . Chronic fatigue 07/22/2015  . Chronic rhinitis 07/22/2015  . Esophageal stricture 07/22/2015  . Eustachian tube dysfunction 07/22/2015  . High risk medication use 07/22/2015  . Hot flash, menopausal 07/22/2015  . Hyperlipidemia 07/22/2015  . Localized swelling of both lower legs 07/22/2015  . Morbid obesity (HCC) 07/22/2015  . Snores 07/22/2015  . Cardiomyopathy (HCC) 07/18/2015  . Gastroesophageal reflux disease without esophagitis 07/18/2015  . Lung mass   . Hypertension   . Anxiety     Current Outpatient Prescriptions on File Prior to Visit  Medication Sig Dispense Refill  . ALPRAZolam (XANAX) 0.5 MG tablet Take 0.5 mg by mouth 3 (three) times daily as needed.      Marland Kitchen amLODipine (NORVASC) 10 MG tablet Take 10 mg by mouth daily.      Marland Kitchen amLODipine (NORVASC) 5 MG tablet     . aspirin 81 MG tablet Take 81 mg by mouth daily.      Marland Kitchen docusate sodium (COLACE) 100 MG capsule Take 100 mg by mouth 2 (two) times daily as needed for mild constipation.    Marland Kitchen esomeprazole (NEXIUM) 20 MG packet Take 20 mg by mouth.    . fluticasone (FLONASE) 50 MCG/ACT nasal spray USE 2 SPRAYS IN EACH NOSTRIL ONCE DAILY    . furosemide (LASIX) 20 MG tablet     . HYDROmorphone (DILAUDID) 4 MG tablet Take 4 mg by mouth every 4 (four) hours as needed for severe pain.    . Ibuprofen-Famotidine (DUEXIS) 800-26.6 MG TABS Take 1 (one) Tablet by  mouth three times daily    . losartan (COZAAR) 50 MG tablet Take 50 mg by mouth.    . meloxicam (MOBIC) 15 MG tablet     . meloxicam (MOBIC) 15 MG tablet Take 15 mg by mouth.    . Multiple Vitamin (MULTIVITAMIN) tablet Take 1 tablet by mouth daily.      . nitroGLYCERIN (NITROSTAT) 0.4 MG SL tablet Place 0.4 mg under the tongue.    Marland Kitchen omeprazole (PRILOSEC) 40 MG capsule Take 40 mg by mouth daily.      Marland Kitchen PROAIR HFA 108 (90 Base) MCG/ACT inhaler     . promethazine (PHENERGAN) 25 MG tablet Take 25 mg by mouth every 8 (eight) hours as needed for nausea or vomiting.    . sertraline (ZOLOFT) 100 MG tablet Take 1 1/2 tablet daily    . sertraline (ZOLOFT) 100 MG tablet     . simvastatin (ZOCOR) 40 MG tablet Take 40 mg by mouth at bedtime.      . traMADol (ULTRAM) 50 MG tablet Take 50 mg by mouth.    . triamcinolone cream (KENALOG) 0.1 % Apply to affected area bid as needed    . triamcinolone cream (KENALOG) 0.1 %      Current Facility-Administered Medications on File Prior to Visit  Medication Dose Route Frequency Provider Last Rate Last Dose  . triamcinolone acetonide (KENALOG) 10 MG/ML  injection 10 mg  10 mg Other Once Asencion Islam, DPM      . triamcinolone acetonide (KENALOG) 10 MG/ML injection 10 mg  10 mg Other Once [place], Thomasine Klutts, DPM      . triamcinolone acetonide (KENALOG-40) injection 20 mg  20 mg Other Once Asencion Islam, DPM        Allergies  Allergen Reactions  . Demerol Anaphylaxis  . Meperidine Anaphylaxis  . Sulfamethoxazole Itching    With redness to skin   . Citalopram Hydrobromide Other (See Comments)    Unknown  . Hydrocodone   . Hydrocodone-Acetaminophen Other (See Comments)    Unknown  . Metoprolol Tartrate Other (See Comments)    Chest discomfort, palpations  . Oxycodone-Acetaminophen Other (See Comments)    Unknown  . Diltiazem Rash  . Metoprolol Tartrate Palpitations    Objective: There were no vitals filed for this visit.  General: No acute distress,  AAOx3  Right foot: Sutures intact with no gapping or dehiscence at surgical site, mild swelling to forefoot on right, mild erythem at 2-3 toe incision, no warmth, no drainage, no signs of infection noted, Capillary fill time <3 seconds in all digits, gross sensation present via light touch to right foot. No pain or crepitation with range of motion right foot however with mild guarding at incision sites.  No pain with calf compression.   Post Op Xray, Right foot: Screw and staples Excellent alignment and position. Osteotomy site healing. Hardware intact. Soft tissue swelling within normal limits for post op status.   Assessment and Plan:  Problem List Items Addressed This Visit    None    Visit Diagnoses    S/P foot surgery, right    -  Primary   Bunion       Relevant Orders   DG Foot Complete Right   Osteoarthritis of right ankle and foot       Bony exostosis       Capsulitis of foot, right       Right foot pain           -Patient seen and evaluated -Xrays reviewed  -Applied dry sterile dressing to surgical site right foot secured with ACE wrap and stockinet  -Advised patient to make sure to keep dressings clean, dry, and intact to right surgical site, removing the ACE as needed  -Rx Augmentin for preventative measures of redness at incision -Advised patient to continue with NWB with cam boot -Advised patient to limit activity to necessity  -Advised patient to ice and elevate as necessary  -Refilled Dilaudid  -Will plan for suture removal at next office visit. In the meantime, patient to call office if any issues or problems arise.   Asencion Islam, DPM

## 2016-11-19 ENCOUNTER — Ambulatory Visit (INDEPENDENT_AMBULATORY_CARE_PROVIDER_SITE_OTHER): Payer: BLUE CROSS/BLUE SHIELD | Admitting: Sports Medicine

## 2016-11-19 DIAGNOSIS — Z9889 Other specified postprocedural states: Secondary | ICD-10-CM

## 2016-11-19 DIAGNOSIS — M79671 Pain in right foot: Secondary | ICD-10-CM

## 2016-11-19 MED ORDER — AMOXICILLIN-POT CLAVULANATE 875-125 MG PO TABS: 1.0000 | ORAL_TABLET | Freq: Two times a day (BID) | ORAL | 0 refills | Status: DC

## 2016-11-19 NOTE — Progress Notes (Signed)
Subjective: [name] is a 51 y.o. female patient seen today in office for POV #2 (DOS 11-03-16), S/P Right austin bunionectomy, 2-3 capsulotomy, TMTJ fusion, 5th tailors bunion. Patient states that she is doing better. However, has been clumsy with her scooter and fell down on her left knee, denies calf pain, denies headache, chest pain, shortness of breath, nausea, vomiting, fever, or chills. No other issues noted.   Patient Active Problem List   Diagnosis Date Noted  . Asthma 07/22/2015  . Bulging lumbar disc 07/22/2015  . Carpal tunnel syndrome 07/22/2015  . Chronic fatigue 07/22/2015  . Chronic rhinitis 07/22/2015  . Esophageal stricture 07/22/2015  . Eustachian tube dysfunction 07/22/2015  . High risk medication use 07/22/2015  . Hot flash, menopausal 07/22/2015  . Hyperlipidemia 07/22/2015  . Localized swelling of both lower legs 07/22/2015  . Morbid obesity (HCC) 07/22/2015  . Snores 07/22/2015  . Cardiomyopathy (HCC) 07/18/2015  . Gastroesophageal reflux disease without esophagitis 07/18/2015  . Lung mass   . Hypertension   . Anxiety     Current Outpatient Prescriptions on File Prior to Visit  Medication Sig Dispense Refill  . ALPRAZolam (XANAX) 0.5 MG tablet Take 0.5 mg by mouth 3 (three) times daily as needed.      Marland Kitchen amLODipine (NORVASC) 10 MG tablet Take 10 mg by mouth daily.      Marland Kitchen amLODipine (NORVASC) 5 MG tablet     . aspirin 81 MG tablet Take 81 mg by mouth daily.      Marland Kitchen docusate sodium (COLACE) 100 MG capsule Take 100 mg by mouth 2 (two) times daily as needed for mild constipation.    Marland Kitchen esomeprazole (NEXIUM) 20 MG packet Take 20 mg by mouth.    . fluticasone (FLONASE) 50 MCG/ACT nasal spray USE 2 SPRAYS IN EACH NOSTRIL ONCE DAILY    . furosemide (LASIX) 20 MG tablet     . HYDROmorphone (DILAUDID) 4 MG tablet Take 1 tablet (4 mg total) by mouth every 6 (six) hours as needed for severe pain. 28 tablet 0  . Ibuprofen-Famotidine (DUEXIS) 800-26.6 MG TABS Take 1  (one) Tablet by mouth three times daily    . losartan (COZAAR) 50 MG tablet Take 50 mg by mouth.    . meloxicam (MOBIC) 15 MG tablet     . meloxicam (MOBIC) 15 MG tablet Take 15 mg by mouth.    . Multiple Vitamin (MULTIVITAMIN) tablet Take 1 tablet by mouth daily.      . nitroGLYCERIN (NITROSTAT) 0.4 MG SL tablet Place 0.4 mg under the tongue.    Marland Kitchen omeprazole (PRILOSEC) 40 MG capsule Take 40 mg by mouth daily.      Marland Kitchen PROAIR HFA 108 (90 Base) MCG/ACT inhaler     . promethazine (PHENERGAN) 25 MG tablet Take 25 mg by mouth every 8 (eight) hours as needed for nausea or vomiting.    . sertraline (ZOLOFT) 100 MG tablet Take 1 1/2 tablet daily    . sertraline (ZOLOFT) 100 MG tablet     . simvastatin (ZOCOR) 40 MG tablet Take 40 mg by mouth at bedtime.      . traMADol (ULTRAM) 50 MG tablet Take 50 mg by mouth.    . triamcinolone cream (KENALOG) 0.1 % Apply to affected area bid as needed    . triamcinolone cream (KENALOG) 0.1 %      Current Facility-Administered Medications on File Prior to Visit  Medication Dose Route Frequency Provider Last Rate Last Dose  .  triamcinolone acetonide (KENALOG) 10 MG/ML injection 10 mg  10 mg Other Once Asencion Islam, DPM      . triamcinolone acetonide (KENALOG) 10 MG/ML injection 10 mg  10 mg Other Once Burns, Klayten Jolliff, DPM      . triamcinolone acetonide (KENALOG-40) injection 20 mg  20 mg Other Once Asencion Islam, DPM        Allergies  Allergen Reactions  . Demerol Anaphylaxis  . Meperidine Anaphylaxis  . Sulfamethoxazole Itching    With redness to skin   . Citalopram Hydrobromide Other (See Comments)    Unknown  . Hydrocodone   . Hydrocodone-Acetaminophen Other (See Comments)    Unknown  . Metoprolol Tartrate Other (See Comments)    Chest discomfort, palpations  . Oxycodone-Acetaminophen Other (See Comments)    Unknown  . Diltiazem Rash  . Metoprolol Tartrate Palpitations    Objective: There were no vitals filed for this visit.  General: No  acute distress, AAOx3  Right foot: Sutures intact with no dehiscence, but mild gapping at surgical sites, mild swelling to forefoot on right, mild erythem at 2-3 toe incision, no warmth, no drainage, no signs of infection noted, Capillary fill time <3 seconds in all digits, gross sensation present via light touch to right foot. No pain or crepitation with range of motion right foot however with mild guarding at incision sites.  No pain with calf compression.    Assessment and Plan:  Problem List Items Addressed This Visit    None    Visit Diagnoses    Right foot pain    -  Primary   S/P foot surgery, right       Relevant Medications   amoxicillin-clavulanate (AUGMENTIN) 875-125 MG tablet       -Patient seen and evaluated -Sutures were left in place to be taken out at next office visit -Applied antibiotic cream and dry sterile dressing to surgical site right foot secured with ACE wrap and stockinet  -Advised patient to make sure to keep dressings clean, dry, and intact to right surgical site, removing the ACE as needed  -Refilled Augmentin for preventative measures of redness at incision and abrasion at left knee -Advised patient to continue with NWB with cam boot -Advised patient to limit activity to necessity  -Advised patient to ice and elevate as necessary  -Continue with dilaudid as needed for severe pain -Will plan for suture removal at next office visit. In the meantime, patient to call office if any issues or problems arise.   Asencion Islam, DPM

## 2016-11-27 ENCOUNTER — Ambulatory Visit (INDEPENDENT_AMBULATORY_CARE_PROVIDER_SITE_OTHER): Payer: BLUE CROSS/BLUE SHIELD | Admitting: Sports Medicine

## 2016-11-27 DIAGNOSIS — Z9889 Other specified postprocedural states: Secondary | ICD-10-CM | POA: Diagnosis not present

## 2016-11-27 DIAGNOSIS — M79671 Pain in right foot: Secondary | ICD-10-CM

## 2016-11-27 NOTE — Progress Notes (Addendum)
Subjective: [name] is a 51 y.o. female patient seen today in office for POV #3 (DOS 11-03-16), S/P Right austin bunionectomy, 2-3 capsulotomy, TMTJ fusion, 5th tailors bunion. Patient states that she is doing better, states that her foot feels good with no issues. Reports that she has been walking on her foot with her boot; denies calf pain, denies headache, chest pain, shortness of breath, nausea, vomiting, fever, or chills. No other issues noted.   Patient Active Problem List   Diagnosis Date Noted  . Asthma 07/22/2015  . Bulging lumbar disc 07/22/2015  . Carpal tunnel syndrome 07/22/2015  . Chronic fatigue 07/22/2015  . Chronic rhinitis 07/22/2015  . Esophageal stricture 07/22/2015  . Eustachian tube dysfunction 07/22/2015  . High risk medication use 07/22/2015  . Hot flash, menopausal 07/22/2015  . Hyperlipidemia 07/22/2015  . Localized swelling of both lower legs 07/22/2015  . Morbid obesity (HCC) 07/22/2015  . Snores 07/22/2015  . Cardiomyopathy (HCC) 07/18/2015  . Gastroesophageal reflux disease without esophagitis 07/18/2015  . Lung mass   . Hypertension   . Anxiety     Current Outpatient Prescriptions on File Prior to Visit  Medication Sig Dispense Refill  . ALPRAZolam (XANAX) 0.5 MG tablet Take 0.5 mg by mouth 3 (three) times daily as needed.      Marland Kitchen amLODipine (NORVASC) 10 MG tablet Take 10 mg by mouth daily.      Marland Kitchen amLODipine (NORVASC) 5 MG tablet     . amoxicillin-clavulanate (AUGMENTIN) 875-125 MG tablet Take 1 tablet by mouth 2 (two) times daily. 14 tablet 0  . aspirin 81 MG tablet Take 81 mg by mouth daily.      Marland Kitchen docusate sodium (COLACE) 100 MG capsule Take 100 mg by mouth 2 (two) times daily as needed for mild constipation.    Marland Kitchen esomeprazole (NEXIUM) 20 MG packet Take 20 mg by mouth.    . fluticasone (FLONASE) 50 MCG/ACT nasal spray USE 2 SPRAYS IN EACH NOSTRIL ONCE DAILY    . furosemide (LASIX) 20 MG tablet     . HYDROmorphone (DILAUDID) 4 MG tablet Take 1  tablet (4 mg total) by mouth every 6 (six) hours as needed for severe pain. 28 tablet 0  . Ibuprofen-Famotidine (DUEXIS) 800-26.6 MG TABS Take 1 (one) Tablet by mouth three times daily    . losartan (COZAAR) 50 MG tablet Take 50 mg by mouth.    . meloxicam (MOBIC) 15 MG tablet     . meloxicam (MOBIC) 15 MG tablet Take 15 mg by mouth.    . Multiple Vitamin (MULTIVITAMIN) tablet Take 1 tablet by mouth daily.      . nitroGLYCERIN (NITROSTAT) 0.4 MG SL tablet Place 0.4 mg under the tongue.    Marland Kitchen omeprazole (PRILOSEC) 40 MG capsule Take 40 mg by mouth daily.      Marland Kitchen PROAIR HFA 108 (90 Base) MCG/ACT inhaler     . promethazine (PHENERGAN) 25 MG tablet Take 25 mg by mouth every 8 (eight) hours as needed for nausea or vomiting.    . sertraline (ZOLOFT) 100 MG tablet Take 1 1/2 tablet daily    . sertraline (ZOLOFT) 100 MG tablet     . simvastatin (ZOCOR) 40 MG tablet Take 40 mg by mouth at bedtime.      . traMADol (ULTRAM) 50 MG tablet Take 50 mg by mouth.    . triamcinolone cream (KENALOG) 0.1 % Apply to affected area bid as needed    . triamcinolone cream (KENALOG) 0.1 %  Current Facility-Administered Medications on File Prior to Visit  Medication Dose Route Frequency Provider Last Rate Last Dose  . triamcinolone acetonide (KENALOG) 10 MG/ML injection 10 mg  10 mg Other Once Asencion Islam, DPM      . triamcinolone acetonide (KENALOG) 10 MG/ML injection 10 mg  10 mg Other Once [place], Kavitha Lansdale, DPM      . triamcinolone acetonide (KENALOG-40) injection 20 mg  20 mg Other Once Asencion Islam, DPM        Allergies  Allergen Reactions  . Demerol Anaphylaxis  . Meperidine Anaphylaxis  . Sulfamethoxazole Itching    With redness to skin   . Citalopram Hydrobromide Other (See Comments)    Unknown  . Hydrocodone   . Hydrocodone-Acetaminophen Other (See Comments)    Unknown  . Metoprolol Tartrate Other (See Comments)    Chest discomfort, palpations  . Oxycodone-Acetaminophen Other (See  Comments)    Unknown  . Diltiazem Rash  . Metoprolol Tartrate Palpitations    Objective: There were no vitals filed for this visit.  General: No acute distress, AAOx3  Right foot: Sutures intact with no dehiscence, but mild gapping at Lateral surgical site, mild swelling to forefoot on right, decreased erythema at 2-3 toe incision, no warmth, no drainage, no other signs of infection noted, Capillary fill time <3 seconds in all digits, gross sensation present via light touch to right foot. No pain or crepitation with range of motion right foot however with mild guarding at incision sites.  No pain with calf compression.    Assessment and Plan:  Problem List Items Addressed This Visit    None    Visit Diagnoses    S/P foot surgery, right    -  Primary   Right foot pain           -Patient seen and evaluated -Sutures were removed -Applied antibiotic cream and dry sterile dressing to surgical site right foot secured with ACE wrap and stockinet  -Advised patient to make sure to keep dressings clean, dry, and intact to right surgical site, removing the ACE as needed Advised patient not to get her foot wet and that she can change the sterile dressings in the wraps, AND I advised patient that I'm being very cautious because of the slow healing that is occurring at the dorsal lateral incision sites of the midfoot  -Continue with Augmentin for preventative measures of redness at incision and abrasion at left knee until completed -Advised patient to limit her weightbearing and has transitioned Patient to postop shoe -Advised patient of no driving -Advised patient to limit activity to necessity  -Advised patient to ice and elevate as necessary  -Continue with dilaudid as needed for severe pain -Will plan for x-ray and allowing patient to get the foot wet with a shower and dispensing a compression anklet at next office visit. In the meantime, patient to call office if any issues or problems  arise.   Asencion Islam, DPM

## 2016-12-18 ENCOUNTER — Ambulatory Visit (INDEPENDENT_AMBULATORY_CARE_PROVIDER_SITE_OTHER): Payer: BLUE CROSS/BLUE SHIELD | Admitting: Sports Medicine

## 2016-12-18 ENCOUNTER — Ambulatory Visit (INDEPENDENT_AMBULATORY_CARE_PROVIDER_SITE_OTHER): Payer: BLUE CROSS/BLUE SHIELD

## 2016-12-18 DIAGNOSIS — M19071 Primary osteoarthritis, right ankle and foot: Secondary | ICD-10-CM

## 2016-12-18 DIAGNOSIS — M21619 Bunion of unspecified foot: Secondary | ICD-10-CM | POA: Diagnosis not present

## 2016-12-18 DIAGNOSIS — M779 Enthesopathy, unspecified: Secondary | ICD-10-CM

## 2016-12-18 DIAGNOSIS — M79671 Pain in right foot: Secondary | ICD-10-CM

## 2016-12-18 DIAGNOSIS — M778 Other enthesopathies, not elsewhere classified: Secondary | ICD-10-CM

## 2016-12-18 DIAGNOSIS — M7751 Other enthesopathy of right foot: Secondary | ICD-10-CM

## 2016-12-18 DIAGNOSIS — M898X9 Other specified disorders of bone, unspecified site: Secondary | ICD-10-CM

## 2016-12-18 DIAGNOSIS — Z9889 Other specified postprocedural states: Secondary | ICD-10-CM

## 2016-12-18 NOTE — Progress Notes (Signed)
Subjective: [name] is a 51 y.o. female patient seen today in office for POV #4 (DOS 11-03-16), S/P Right austin bunionectomy, 2-3 capsulotomy, TMTJ fusion, 5th tailors bunion. Patient states that she is doing good and replaced steri-strips at lateral foot. Reports that she has been walking on her foot in a normal shoe; denies calf pain, denies headache, chest pain, shortness of breath, nausea, vomiting, fever, or chills. No other issues noted.   Patient Active Problem List   Diagnosis Date Noted  . Asthma 07/22/2015  . Bulging lumbar disc 07/22/2015  . Carpal tunnel syndrome 07/22/2015  . Chronic fatigue 07/22/2015  . Chronic rhinitis 07/22/2015  . Esophageal stricture 07/22/2015  . Eustachian tube dysfunction 07/22/2015  . High risk medication use 07/22/2015  . Hot flash, menopausal 07/22/2015  . Hyperlipidemia 07/22/2015  . Localized swelling of both lower legs 07/22/2015  . Morbid obesity (HCC) 07/22/2015  . Snores 07/22/2015  . Cardiomyopathy (HCC) 07/18/2015  . Gastroesophageal reflux disease without esophagitis 07/18/2015  . Lung mass   . Hypertension   . Anxiety     Current Outpatient Prescriptions on File Prior to Visit  Medication Sig Dispense Refill  . ALPRAZolam (XANAX) 0.5 MG tablet Take 0.5 mg by mouth 3 (three) times daily as needed.      Marland Kitchen amLODipine (NORVASC) 10 MG tablet Take 10 mg by mouth daily.      Marland Kitchen amLODipine (NORVASC) 5 MG tablet     . amoxicillin-clavulanate (AUGMENTIN) 875-125 MG tablet Take 1 tablet by mouth 2 (two) times daily. 14 tablet 0  . aspirin 81 MG tablet Take 81 mg by mouth daily.      Marland Kitchen docusate sodium (COLACE) 100 MG capsule Take 100 mg by mouth 2 (two) times daily as needed for mild constipation.    Marland Kitchen esomeprazole (NEXIUM) 20 MG packet Take 20 mg by mouth.    . fluticasone (FLONASE) 50 MCG/ACT nasal spray USE 2 SPRAYS IN EACH NOSTRIL ONCE DAILY    . furosemide (LASIX) 20 MG tablet     . HYDROmorphone (DILAUDID) 4 MG tablet Take 1  tablet (4 mg total) by mouth every 6 (six) hours as needed for severe pain. 28 tablet 0  . Ibuprofen-Famotidine (DUEXIS) 800-26.6 MG TABS Take 1 (one) Tablet by mouth three times daily    . losartan (COZAAR) 50 MG tablet Take 50 mg by mouth.    . meloxicam (MOBIC) 15 MG tablet     . meloxicam (MOBIC) 15 MG tablet Take 15 mg by mouth.    . Multiple Vitamin (MULTIVITAMIN) tablet Take 1 tablet by mouth daily.      . nitroGLYCERIN (NITROSTAT) 0.4 MG SL tablet Place 0.4 mg under the tongue.    Marland Kitchen omeprazole (PRILOSEC) 40 MG capsule Take 40 mg by mouth daily.      Marland Kitchen PROAIR HFA 108 (90 Base) MCG/ACT inhaler     . promethazine (PHENERGAN) 25 MG tablet Take 25 mg by mouth every 8 (eight) hours as needed for nausea or vomiting.    . sertraline (ZOLOFT) 100 MG tablet Take 1 1/2 tablet daily    . sertraline (ZOLOFT) 100 MG tablet     . simvastatin (ZOCOR) 40 MG tablet Take 40 mg by mouth at bedtime.      . traMADol (ULTRAM) 50 MG tablet Take 50 mg by mouth.    . triamcinolone cream (KENALOG) 0.1 % Apply to affected area bid as needed    . triamcinolone cream (KENALOG) 0.1 %  Current Facility-Administered Medications on File Prior to Visit  Medication Dose Route Frequency Provider Last Rate Last Dose  . triamcinolone acetonide (KENALOG) 10 MG/ML injection 10 mg  10 mg Other Once Asencion Islam, DPM      . triamcinolone acetonide (KENALOG) 10 MG/ML injection 10 mg  10 mg Other Once [place], Michaeljames Milnes, DPM      . triamcinolone acetonide (KENALOG-40) injection 20 mg  20 mg Other Once Asencion Islam, DPM        Allergies  Allergen Reactions  . Demerol Anaphylaxis  . Meperidine Anaphylaxis  . Sulfamethoxazole Itching    With redness to skin   . Citalopram Hydrobromide Other (See Comments)    Unknown  . Hydrocodone   . Hydrocodone-Acetaminophen Other (See Comments)    Unknown  . Metoprolol Tartrate Other (See Comments)    Chest discomfort, palpations  . Oxycodone-Acetaminophen Other (See  Comments)    Unknown  . Diltiazem Rash  . Metoprolol Tartrate Palpitations    Objective: There were no vitals filed for this visit.  General: No acute distress, AAOx3  Right foot: All intact but mild gapping at Lateral surgical site, mild swelling to forefoot on right, decreased erythema at 2-3 toe incision, no warmth, no drainage, no other signs of infection noted, Capillary fill time <3 seconds in all digits, gross sensation present via light touch to right foot. No pain or crepitation with range of motion right foot however with mild guarding at incision sites.  No pain with calf compression.    Assessment and Plan:  Problem List Items Addressed This Visit    None    Visit Diagnoses    S/P foot surgery, right    -  Primary   Bunion       Relevant Orders   DG Foot Complete Right (Completed)   Right foot pain       Osteoarthritis of right ankle and foot       Bony exostosis       Capsulitis of foot, right           -Patient seen and evaluated -Applied dermabond and steri-strips to lateral incision on right foot  -Applied compression anklet for edema control -Advised patient to limit activity to necessity with post op shoe with slow transition to normal shoe even though patient still is noncompliant and comes to office in normal shoes and has been showering  -Will plan for incision check and increasing activities at next visit. In the meantime, patient to call office if any issues or problems arise.   Asencion Islam, DPM

## 2016-12-22 ENCOUNTER — Telehealth: Payer: Self-pay | Admitting: Sports Medicine

## 2016-12-22 ENCOUNTER — Ambulatory Visit: Payer: BLUE CROSS/BLUE SHIELD | Admitting: *Deleted

## 2016-12-22 DIAGNOSIS — Z9889 Other specified postprocedural states: Secondary | ICD-10-CM

## 2016-12-22 NOTE — Telephone Encounter (Signed)
Yes, I'm calling because I was there last week and Dr. Marylene Land told the nurse that my foot needed to be glued, however they did not have the glue in the office. I know she is out of the office this week but I still have a hole in the side of my foot and I want it glued. If someone could please call me back at [phone]. Thank you.

## 2016-12-22 NOTE — Telephone Encounter (Signed)
Called patient who stated she is in [employer] today and would like to come in today if possible.  Okayed this patient will be scheduled as a walk in on nurse schedule when she arrives.

## 2016-12-23 NOTE — Progress Notes (Signed)
Patient ID: [name], female   DOB: May 15, 1966, 51 y.o.   MRN: [id number]   Patient came to the [facility] office to have Dermabond applied to the open area on her right outside suture line as the [place] office was out.  Glue and steri-strips applied to area.  Patient told to keep the area clean and dry for 7 days.

## 2016-12-24 ENCOUNTER — Telehealth: Payer: Self-pay | Admitting: Sports Medicine

## 2016-12-24 ENCOUNTER — Ambulatory Visit (INDEPENDENT_AMBULATORY_CARE_PROVIDER_SITE_OTHER): Payer: Self-pay | Admitting: Podiatry

## 2016-12-24 DIAGNOSIS — T8130XA Disruption of wound, unspecified, initial encounter: Secondary | ICD-10-CM

## 2016-12-24 DIAGNOSIS — Z9889 Other specified postprocedural states: Secondary | ICD-10-CM

## 2016-12-24 LAB — CBC WITH DIFFERENTIAL/PLATELET
Basophils Absolute: 57 cells/uL (ref 0–200)
Basophils Relative: 1 %
Eosinophils Absolute: 57 cells/uL (ref 15–500)
Eosinophils Relative: 1 %
HCT: 41.1 % (ref 35.0–45.0)
Hemoglobin: 14 g/dL (ref 11.7–15.5)
Lymphocytes Relative: 27 %
Lymphs Abs: 1539 cells/uL (ref 850–3900)
MCH: 33.6 pg — ABNORMAL HIGH (ref 27.0–33.0)
MCHC: 34.1 g/dL (ref 32.0–36.0)
MCV: 98.6 fL (ref 80.0–100.0)
MPV: 9.9 fL (ref 7.5–12.5)
Monocytes Absolute: 627 cells/uL (ref 200–950)
Monocytes Relative: 11 %
Neutro Abs: 3420 cells/uL (ref 1500–7800)
Neutrophils Relative %: 60 %
Platelets: 379 10*3/uL (ref 140–400)
RBC: 4.17 MIL/uL (ref 3.80–5.10)
RDW: 13.4 % (ref 11.0–15.0)
WBC: 5.7 10*3/uL (ref 3.8–10.8)

## 2016-12-24 NOTE — Telephone Encounter (Signed)
Pt called back because she could not remember if she left her phone number when she called earlier.

## 2016-12-24 NOTE — Telephone Encounter (Signed)
Pt called saying she was seen on Monday to have Durabond glue put in the place on her foot. Stated she took the bandage off and it also took the glue out. Wants to know what she should do. Requested a call back today.

## 2016-12-24 NOTE — Progress Notes (Signed)
[name] is a 51 y.o. female patient seen today in office for POV #4 (DOS 11-03-16), S/P Right austin bunionectomy, 2-3 capsulotomy, TMTJ fusion, 5th tailors bunion. She presents today as a walk-in patient. Patient came into clinic today concerned with the surgical incision on the lateral side of her foot that is not healing. She denies any drainage or pus coming from the area and denies any swelling or redness to her foot. She denies any systemic complaints as fevers, chills, nausea, vomiting. Denies any calf pain, chest pain, shortness of breath.  Objective: AAO 3, NAD  DP/PT pulses 2/4, CRT less than 3 seconds. On the incisions on the right foot are well-healed except for the dorsal lateral aspect of the foot in which there is a small opening present within the central aspect of the incision. The wound measures 0.2 x 0.2 x 0.9 cm. There is no drainage or pus expressed. There is no swelling erythema, ascending cellulitis. No fluctuance, crepitus, malodor. There is no clinical signs of infection identified today. There is no tenderness palpation along this wound and there is no other area tenderness at this time. There is no other open lesions or pre-ulcerative lesions. No pain with calf compression, swelling, warmth, erythema.  Assessment : Wound dorsal lateral right foot   Plan: -Treatment options discussed including all alternatives, risks, and complications -Etiology of symptoms were discussed -At this point I did culture the wound. ESR, CRP, CBC was ordered today. -Will perform double dressing changes with prism.  -I will hold antibiotics until the cultures and lab work back. There is no signs of infection identified today although the wound is still open. She states that she just completed a course of antibiotics. -Monitor for any clinical signs or symptoms of infection and directed to call the office immediately should any occur or go to the ER. -RTC next week with Dr. Cannon Kettle or  sooner if needed.   Celesta Gentile, DPM

## 2016-12-25 LAB — SEDIMENTATION RATE: Sed Rate: 4 mm/hr (ref 0–30)

## 2016-12-25 LAB — C-REACTIVE PROTEIN: CRP: 2.1 mg/L (ref <8.0)

## 2016-12-27 LAB — WOUND CULTURE
Gram Stain: NONE SEEN
Gram Stain: NONE SEEN

## 2016-12-28 ENCOUNTER — Other Ambulatory Visit: Payer: Self-pay | Admitting: Podiatry

## 2016-12-28 MED ORDER — CIPROFLOXACIN HCL 500 MG PO TABS: 500.0000 mg | ORAL_TABLET | Freq: Two times a day (BID) | ORAL | 0 refills | Status: DC

## 2016-12-28 NOTE — Progress Notes (Signed)
Cipro sent to her pharmacy due to wound culture results.

## 2016-12-29 ENCOUNTER — Telehealth: Payer: Self-pay | Admitting: *Deleted

## 2016-12-29 NOTE — Telephone Encounter (Addendum)
-----   Message from Vivi Barrack, DPM sent at 12/28/2016  8:22 AM EDT ----- I have called in cipro. Please let her know. This is due to the wound culture. Blood work is normal.12/29/2016-I informed pt of Dr. Gabriel Rung new orders.

## 2016-12-31 ENCOUNTER — Ambulatory Visit: Payer: BLUE CROSS/BLUE SHIELD | Admitting: Sports Medicine

## 2017-01-01 ENCOUNTER — Ambulatory Visit (INDEPENDENT_AMBULATORY_CARE_PROVIDER_SITE_OTHER): Payer: Self-pay | Admitting: Sports Medicine

## 2017-01-01 ENCOUNTER — Telehealth: Payer: Self-pay | Admitting: *Deleted

## 2017-01-01 DIAGNOSIS — M79671 Pain in right foot: Secondary | ICD-10-CM

## 2017-01-01 DIAGNOSIS — T8130XA Disruption of wound, unspecified, initial encounter: Secondary | ICD-10-CM

## 2017-01-01 DIAGNOSIS — Z9889 Other specified postprocedural states: Secondary | ICD-10-CM

## 2017-01-01 NOTE — Telephone Encounter (Signed)
"  I was just calling you.  Dr. Marylene Land is trying to get me scheduled for Monday at the [facility].  Please give me a call back."

## 2017-01-02 NOTE — Progress Notes (Signed)
Subjective: [name] is a 51 y.o. female patient seen today in office for POV #7 (DOS 11-03-16), S/P Right austin bunionectomy, 2-3 capsulotomy, TMTJ fusion, 5th tailors bunion. Patient states that she is doing good however incision is back open replaced steri-strips at lateral foot and is now on Cipro as started by Dr. Ardelle Anton after her saw her last week. Patient denies calf pain, denies headache, chest pain, shortness of breath, nausea, vomiting, fever, or chills. No other issues noted.   Patient Active Problem List   Diagnosis Date Noted  . Asthma 07/22/2015  . Bulging lumbar disc 07/22/2015  . Carpal tunnel syndrome 07/22/2015  . Chronic fatigue 07/22/2015  . Chronic rhinitis 07/22/2015  . Esophageal stricture 07/22/2015  . Eustachian tube dysfunction 07/22/2015  . High risk medication use 07/22/2015  . Hot flash, menopausal 07/22/2015  . Hyperlipidemia 07/22/2015  . Localized swelling of both lower legs 07/22/2015  . Morbid obesity (HCC) 07/22/2015  . Snores 07/22/2015  . Cardiomyopathy (HCC) 07/18/2015  . Gastroesophageal reflux disease without esophagitis 07/18/2015  . Lung mass   . Hypertension   . Anxiety     Current Outpatient Prescriptions on File Prior to Visit  Medication Sig Dispense Refill  . ALPRAZolam (XANAX) 0.5 MG tablet Take 0.5 mg by mouth 3 (three) times daily as needed.      Marland Kitchen amLODipine (NORVASC) 10 MG tablet Take 10 mg by mouth daily.      Marland Kitchen amLODipine (NORVASC) 5 MG tablet     . amoxicillin-clavulanate (AUGMENTIN) 875-125 MG tablet Take 1 tablet by mouth 2 (two) times daily. 14 tablet 0  . aspirin 81 MG tablet Take 81 mg by mouth daily.      . ciprofloxacin (CIPRO) 500 MG tablet Take 1 tablet (500 mg total) by mouth 2 (two) times daily. 20 tablet 0  . docusate sodium (COLACE) 100 MG capsule Take 100 mg by mouth 2 (two) times daily as needed for mild constipation.    Marland Kitchen esomeprazole (NEXIUM) 20 MG packet Take 20 mg by mouth.    . fluticasone (FLONASE)  50 MCG/ACT nasal spray USE 2 SPRAYS IN EACH NOSTRIL ONCE DAILY    . furosemide (LASIX) 20 MG tablet     . HYDROmorphone (DILAUDID) 4 MG tablet Take 1 tablet (4 mg total) by mouth every 6 (six) hours as needed for severe pain. 28 tablet 0  . Ibuprofen-Famotidine (DUEXIS) 800-26.6 MG TABS Take 1 (one) Tablet by mouth three times daily    . losartan (COZAAR) 50 MG tablet Take 50 mg by mouth.    . meloxicam (MOBIC) 15 MG tablet     . meloxicam (MOBIC) 15 MG tablet Take 15 mg by mouth.    . Multiple Vitamin (MULTIVITAMIN) tablet Take 1 tablet by mouth daily.      . nitroGLYCERIN (NITROSTAT) 0.4 MG SL tablet Place 0.4 mg under the tongue.    Marland Kitchen omeprazole (PRILOSEC) 40 MG capsule Take 40 mg by mouth daily.      Marland Kitchen PROAIR HFA 108 (90 Base) MCG/ACT inhaler     . promethazine (PHENERGAN) 25 MG tablet Take 25 mg by mouth every 8 (eight) hours as needed for nausea or vomiting.    . sertraline (ZOLOFT) 100 MG tablet Take 1 1/2 tablet daily    . sertraline (ZOLOFT) 100 MG tablet     . simvastatin (ZOCOR) 40 MG tablet Take 40 mg by mouth at bedtime.      . traMADol (ULTRAM) 50 MG tablet Take  50 mg by mouth.    . triamcinolone cream (KENALOG) 0.1 % Apply to affected area bid as needed    . triamcinolone cream (KENALOG) 0.1 %      Current Facility-Administered Medications on File Prior to Visit  Medication Dose Route Frequency Provider Last Rate Last Dose  . triamcinolone acetonide (KENALOG) 10 MG/ML injection 10 mg  10 mg Other Once Asencion Islam, DPM      . triamcinolone acetonide (KENALOG) 10 MG/ML injection 10 mg  10 mg Other Once [place], Phillipe Clemon, DPM      . triamcinolone acetonide (KENALOG-40) injection 20 mg  20 mg Other Once Asencion Islam, DPM        Allergies  Allergen Reactions  . Demerol Anaphylaxis  . Meperidine Anaphylaxis  . Sulfamethoxazole Itching    With redness to skin   . Citalopram Hydrobromide Other (See Comments)    Unknown  . Hydrocodone   . Hydrocodone-Acetaminophen Other  (See Comments)    Unknown  . Metoprolol Tartrate Other (See Comments)    Chest discomfort, palpations  . Oxycodone-Acetaminophen Other (See Comments)    Unknown  . Diltiazem Rash  . Metoprolol Tartrate Palpitations    Objective: There were no vitals filed for this visit.  General: No acute distress, AAOx3  Right foot: At Lateral surgical site central wound dehiscence that measures 0.1x0.2x0.4cm probes to deep soft tissue end range with no active drainage, mild swelling to forefoot on right, no erythema, no warmth, no drainage, no other signs of infection noted, Capillary fill time <3 seconds in all digits, gross sensation present via light touch to right foot. No pain or crepitation with range of motion right foot. No pain with calf compression.    Assessment and Plan:  Problem List Items Addressed This Visit    None    Visit Diagnoses    S/P foot surgery, right    -  Primary   Wound dehiscence       Right foot pain           -Patient seen and evaluated -Discussed treatment options for dehiscence  -Patient opt for surgical management. Consent obtained for right foot debridement with closure of incision. Pre and Post op course explained. Risks, benefits, alternatives explained. No guarantees given or implied. Surgical booking slip submitted and provided patient with Surgical packet and info for [facility].  -Patient tentatively scheduled for Monday  -Applied dermabond and steri-strips to lateral incision on right foot  -Continue compression anklet for edema control -Advised patient to limit activity to necessity and to return to post op shoe -Patient to return after surgery. In the meantime, patient to call office if any issues or problems arise.   Asencion Islam, DPM

## 2017-01-02 NOTE — Telephone Encounter (Signed)
"  I'm supposed to have surgery with Dr. Marylene Land on Monday at the [facility].  What time will I need to be there?"  You should receive a call from someone from the surgical center.  They will give you the arrival time.

## 2017-01-02 NOTE — Patient Instructions (Signed)
Pre-Operative Instructions  Congratulations, you have decided to take an important step to improving your quality of life.  You can be assured that the doctors of [facility] will be with you every step of the way.  1. Plan to be at the surgery center/hospital at least 1 (one) hour prior to your scheduled time unless otherwise directed by the surgical center/hospital staff.  You must have a responsible adult accompany you, remain during the surgery and drive you home.  Make sure you have directions to the surgical center/hospital and know how to get there on time. 2. For hospital based surgery you will need to obtain a history and physical form from your family physician within 1 month prior to the date of surgery- we will give you a form for you primary physician.  3. We make every effort to accommodate the date you request for surgery.  There are however, times where surgery dates or times have to be moved.  We will contact you as soon as possible if a change in schedule is required.   4. No Aspirin/Ibuprofen for one week before surgery.  If you are on aspirin, any non-steroidal anti-inflammatory medications (Mobic, Aleve, Ibuprofen) you should stop taking it 7 days prior to your surgery.  You make take Tylenol  For pain prior to surgery.  5. Medications- If you are taking daily heart and blood pressure medications, seizure, reflux, allergy, asthma, anxiety, pain or diabetes medications, make sure the surgery center/hospital is aware before the day of surgery so they may notify you which medications to take or avoid the day of surgery. 6. No food or drink after midnight the night before surgery unless directed otherwise by surgical center/hospital staff. 7. No alcoholic beverages 24 hours prior to surgery.  No smoking 24 hours prior to or 24 hours after surgery. 8. Wear loose pants or shorts- loose enough to fit over bandages, boots, and casts. 9. No slip on shoes, sneakers are best. 10. Bring  your boot with you to the surgery center/hospital.  Also bring crutches or a walker if your physician has prescribed it for you.  If you do not have this equipment, it will be provided for you after surgery. 11. If you have not been contracted by the surgery center/hospital by the day before your surgery, call to confirm the date and time of your surgery. 12. Leave-time from work may vary depending on the type of surgery you have.  Appropriate arrangements should be made prior to surgery with your employer. 13. Prescriptions will be provided immediately following surgery by your doctor.  Have these filled as soon as possible after surgery and take the medication as directed. 14. Remove nail polish on the operative foot. 15. Wash the night before surgery.  The night before surgery wash the foot and leg well with the antibacterial soap provided and water paying special attention to beneath the toenails and in between the toes.  Rinse thoroughly with water and dry well with a towel.  Perform this wash unless told not to do so by your physician.  Enclosed: 1 Ice pack (please put in freezer the night before surgery)   1 Hibiclens skin cleaner   Pre-op Instructions  If you have any questions regarding the instructions, do not hesitate to call our office.  [place]: [address] [place], [place] [address] [phone]  [place]: [address] [place], [place] [address] [phone]  [place]: [address]  [place], [place] [address] [phone]   Dr.   Norman Regal DPM, Dr. Matthew Wagoner DPM, Dr. M. Todd Hyatt DPM, Dr. Thamar Holik DPM 

## 2017-01-05 DIAGNOSIS — S91301D Unspecified open wound, right foot, subsequent encounter: Secondary | ICD-10-CM | POA: Diagnosis not present

## 2017-01-06 ENCOUNTER — Telehealth: Payer: Self-pay | Admitting: Sports Medicine

## 2017-01-06 NOTE — Telephone Encounter (Signed)
Post op check phone call made to patient to check on her to make sure she is doing well after surgery. Patient did not answer. Left voicemail instructing her to call the office if there are any problems or issues. -Dr. Marylene Land

## 2017-01-06 NOTE — Telephone Encounter (Signed)
Yes, I'm calling because Dr. Marylene Land did my surgery yesterday and she called this morning to see how I was doing. I was still asleep when she called. If you would please let her know that I am doing fine. Thank you.

## 2017-01-06 NOTE — Telephone Encounter (Signed)
Ok great, thanks!

## 2017-01-14 ENCOUNTER — Ambulatory Visit (INDEPENDENT_AMBULATORY_CARE_PROVIDER_SITE_OTHER): Payer: BLUE CROSS/BLUE SHIELD | Admitting: Sports Medicine

## 2017-01-14 DIAGNOSIS — Z9889 Other specified postprocedural states: Secondary | ICD-10-CM

## 2017-01-14 DIAGNOSIS — M79671 Pain in right foot: Secondary | ICD-10-CM

## 2017-01-14 DIAGNOSIS — T8130XA Disruption of wound, unspecified, initial encounter: Secondary | ICD-10-CM

## 2017-01-14 NOTE — Progress Notes (Addendum)
Subjective: [name] is a 51 y.o. female patient seen today in office for POV #1 (DOS 01/05/2017), S/P primary closure of open wound site at the right lateral foot. Patient denies pain at surgical site, reports that she took the bandage off and took off the Steri-Strips, and apply her butterfly strips of the area even though I advised patient to keep dressing clean, dry and intact. Patient is doing her own self-care regardless of what I am recommending; denies calf pain, denies headache, chest pain, shortness of breath, nausea, vomiting, fever, or chills. Patient states that she is doing good and not taking any pain medicines. No other issues noted.   Patient Active Problem List   Diagnosis Date Noted  . Asthma 07/22/2015  . Bulging lumbar disc 07/22/2015  . Carpal tunnel syndrome 07/22/2015  . Chronic fatigue 07/22/2015  . Chronic rhinitis 07/22/2015  . Esophageal stricture 07/22/2015  . Eustachian tube dysfunction 07/22/2015  . High risk medication use 07/22/2015  . Hot flash, menopausal 07/22/2015  . Hyperlipidemia 07/22/2015  . Localized swelling of both lower legs 07/22/2015  . Morbid obesity (HCC) 07/22/2015  . Snores 07/22/2015  . Cardiomyopathy (HCC) 07/18/2015  . Gastroesophageal reflux disease without esophagitis 07/18/2015  . Lung mass   . Hypertension   . Anxiety     Current Outpatient Prescriptions on File Prior to Visit  Medication Sig Dispense Refill  . ALPRAZolam (XANAX) 0.5 MG tablet Take 0.5 mg by mouth 3 (three) times daily as needed.      Marland Kitchen amLODipine (NORVASC) 10 MG tablet Take 10 mg by mouth daily.      Marland Kitchen amLODipine (NORVASC) 5 MG tablet     . amoxicillin-clavulanate (AUGMENTIN) 875-125 MG tablet Take 1 tablet by mouth 2 (two) times daily. 14 tablet 0  . aspirin 81 MG tablet Take 81 mg by mouth daily.      . ciprofloxacin (CIPRO) 500 MG tablet Take 1 tablet (500 mg total) by mouth 2 (two) times daily. 20 tablet 0  . docusate sodium (COLACE) 100 MG capsule  Take 100 mg by mouth 2 (two) times daily as needed for mild constipation.    Marland Kitchen esomeprazole (NEXIUM) 20 MG packet Take 20 mg by mouth.    . fluticasone (FLONASE) 50 MCG/ACT nasal spray USE 2 SPRAYS IN EACH NOSTRIL ONCE DAILY    . furosemide (LASIX) 20 MG tablet     . HYDROmorphone (DILAUDID) 4 MG tablet Take 1 tablet (4 mg total) by mouth every 6 (six) hours as needed for severe pain. 28 tablet 0  . Ibuprofen-Famotidine (DUEXIS) 800-26.6 MG TABS Take 1 (one) Tablet by mouth three times daily    . losartan (COZAAR) 50 MG tablet Take 50 mg by mouth.    . meloxicam (MOBIC) 15 MG tablet     . meloxicam (MOBIC) 15 MG tablet Take 15 mg by mouth.    . Multiple Vitamin (MULTIVITAMIN) tablet Take 1 tablet by mouth daily.      . nitroGLYCERIN (NITROSTAT) 0.4 MG SL tablet Place 0.4 mg under the tongue.    Marland Kitchen omeprazole (PRILOSEC) 40 MG capsule Take 40 mg by mouth daily.      Marland Kitchen PROAIR HFA 108 (90 Base) MCG/ACT inhaler     . promethazine (PHENERGAN) 25 MG tablet Take 25 mg by mouth every 8 (eight) hours as needed for nausea or vomiting.    . sertraline (ZOLOFT) 100 MG tablet Take 1 1/2 tablet daily    . sertraline (ZOLOFT) 100 MG  tablet     . simvastatin (ZOCOR) 40 MG tablet Take 40 mg by mouth at bedtime.      . traMADol (ULTRAM) 50 MG tablet Take 50 mg by mouth.    . triamcinolone cream (KENALOG) 0.1 % Apply to affected area bid as needed    . triamcinolone cream (KENALOG) 0.1 %      Current Facility-Administered Medications on File Prior to Visit  Medication Dose Route Frequency Provider Last Rate Last Dose  . triamcinolone acetonide (KENALOG) 10 MG/ML injection 10 mg  10 mg Other Once Asencion Islam, DPM      . triamcinolone acetonide (KENALOG) 10 MG/ML injection 10 mg  10 mg Other Once [place], Maor Meckel, DPM      . triamcinolone acetonide (KENALOG-40) injection 20 mg  20 mg Other Once Asencion Islam, DPM        Allergies  Allergen Reactions  . Demerol Anaphylaxis  . Meperidine Anaphylaxis  .  Sulfamethoxazole Itching    With redness to skin   . Citalopram Hydrobromide Other (See Comments)    Unknown  . Hydrocodone   . Hydrocodone-Acetaminophen Other (See Comments)    Unknown  . Metoprolol Tartrate Other (See Comments)    Chest discomfort, palpations  . Oxycodone-Acetaminophen Other (See Comments)    Unknown  . Diltiazem Rash  . Metoprolol Tartrate Palpitations    Objective: There were no vitals filed for this visit.  General: No acute distress, AAOx3  Right foot: Sutures intact with no gapping or dehiscence at surgical site, all other surgical sites are well healed with minimal scarring, mild swelling to right foot, no erythema, no warmth, no drainage, no signs of infection noted, Capillary fill time <3 seconds in all digits, gross sensation present via light touch to right foot. No pain or crepitation with range of motion right foot.  No pain with calf compression.   Assessment and Plan:  Problem List Items Addressed This Visit    None    Visit Diagnoses    Wound dehiscence    -  Primary   S/P foot surgery, right       Right foot pain           -Patient seen and evaluated -Applied dry sterile dressing to surgical site right foot secured with Compression sleeve -Advised patient to make sure to keep dressings clean, dry, and intact to right surgical site, removing the compression sleeve as needed . However, did give patient a new set of Steri-Strips in case her dressing comes off or gets wet and that she needs to reapply them. I advised patient, however, to try to keep her dressings clean and dry and in place because the more.She changes them The more she will expose her foot to possible contamination or infection; Patient expressed understanding -Advised patient to continue with post-op shoe on right foot   -Advised patient to limit activity to necessity  -Continue with no work until next appointment  -Advised patient to ice and elevate as necessary  -Will plan for  suture removal at next office visit. In the meantime, patient to call office if any issues or problems arise.   Asencion Islam, DPM

## 2017-01-21 ENCOUNTER — Ambulatory Visit (INDEPENDENT_AMBULATORY_CARE_PROVIDER_SITE_OTHER): Payer: BLUE CROSS/BLUE SHIELD | Admitting: Sports Medicine

## 2017-01-21 DIAGNOSIS — Z9119 Patient's noncompliance with other medical treatment and regimen: Secondary | ICD-10-CM

## 2017-01-21 DIAGNOSIS — Z9889 Other specified postprocedural states: Secondary | ICD-10-CM

## 2017-01-21 DIAGNOSIS — Z91199 Patient's noncompliance with other medical treatment and regimen due to unspecified reason: Secondary | ICD-10-CM

## 2017-01-21 DIAGNOSIS — M79671 Pain in right foot: Secondary | ICD-10-CM

## 2017-01-21 DIAGNOSIS — T8130XA Disruption of wound, unspecified, initial encounter: Secondary | ICD-10-CM

## 2017-01-21 NOTE — Progress Notes (Signed)
Subjective: [name] is a 51 y.o. female patient seen today in office for POV #2 (DOS 01/05/2017), S/P primary closure of open wound site at the right lateral foot. Patient denies pain at surgical site, reports that she is in a normal shoe with a little pain over the scar in between the 2nd and 3rd toes and in her right hip; States that she thinks she needs short term disability because may not be able to return to regular work duties especially after her foot surgery and with the continued pain that she has going on in her hip. No other issues noted.   Patient Active Problem List   Diagnosis Date Noted  . Asthma 07/22/2015  . Bulging lumbar disc 07/22/2015  . Carpal tunnel syndrome 07/22/2015  . Chronic fatigue 07/22/2015  . Chronic rhinitis 07/22/2015  . Esophageal stricture 07/22/2015  . Eustachian tube dysfunction 07/22/2015  . High risk medication use 07/22/2015  . Hot flash, menopausal 07/22/2015  . Hyperlipidemia 07/22/2015  . Localized swelling of both lower legs 07/22/2015  . Morbid obesity (HCC) 07/22/2015  . Snores 07/22/2015  . Cardiomyopathy (HCC) 07/18/2015  . Gastroesophageal reflux disease without esophagitis 07/18/2015  . Lung mass   . Hypertension   . Anxiety     Current Outpatient Prescriptions on File Prior to Visit  Medication Sig Dispense Refill  . ALPRAZolam (XANAX) 0.5 MG tablet Take 0.5 mg by mouth 3 (three) times daily as needed.      Marland Kitchen amLODipine (NORVASC) 10 MG tablet Take 10 mg by mouth daily.      Marland Kitchen amLODipine (NORVASC) 5 MG tablet     . amoxicillin-clavulanate (AUGMENTIN) 875-125 MG tablet Take 1 tablet by mouth 2 (two) times daily. 14 tablet 0  . aspirin 81 MG tablet Take 81 mg by mouth daily.      . ciprofloxacin (CIPRO) 500 MG tablet Take 1 tablet (500 mg total) by mouth 2 (two) times daily. 20 tablet 0  . docusate sodium (COLACE) 100 MG capsule Take 100 mg by mouth 2 (two) times daily as needed for mild constipation.    Marland Kitchen esomeprazole  (NEXIUM) 20 MG packet Take 20 mg by mouth.    . fluticasone (FLONASE) 50 MCG/ACT nasal spray USE 2 SPRAYS IN EACH NOSTRIL ONCE DAILY    . furosemide (LASIX) 20 MG tablet     . HYDROmorphone (DILAUDID) 4 MG tablet Take 1 tablet (4 mg total) by mouth every 6 (six) hours as needed for severe pain. 28 tablet 0  . Ibuprofen-Famotidine (DUEXIS) 800-26.6 MG TABS Take 1 (one) Tablet by mouth three times daily    . losartan (COZAAR) 50 MG tablet Take 50 mg by mouth.    . meloxicam (MOBIC) 15 MG tablet     . meloxicam (MOBIC) 15 MG tablet Take 15 mg by mouth.    . Multiple Vitamin (MULTIVITAMIN) tablet Take 1 tablet by mouth daily.      . nitroGLYCERIN (NITROSTAT) 0.4 MG SL tablet Place 0.4 mg under the tongue.    Marland Kitchen omeprazole (PRILOSEC) 40 MG capsule Take 40 mg by mouth daily.      Marland Kitchen PROAIR HFA 108 (90 Base) MCG/ACT inhaler     . promethazine (PHENERGAN) 25 MG tablet Take 25 mg by mouth every 8 (eight) hours as needed for nausea or vomiting.    . sertraline (ZOLOFT) 100 MG tablet Take 1 1/2 tablet daily    . sertraline (ZOLOFT) 100 MG tablet     . simvastatin (  ZOCOR) 40 MG tablet Take 40 mg by mouth at bedtime.      . traMADol (ULTRAM) 50 MG tablet Take 50 mg by mouth.    . triamcinolone cream (KENALOG) 0.1 % Apply to affected area bid as needed    . triamcinolone cream (KENALOG) 0.1 %      Current Facility-Administered Medications on File Prior to Visit  Medication Dose Route Frequency Provider Last Rate Last Dose  . triamcinolone acetonide (KENALOG) 10 MG/ML injection 10 mg  10 mg Other Once Asencion Islam, DPM      . triamcinolone acetonide (KENALOG) 10 MG/ML injection 10 mg  10 mg Other Once [place], Aubrea Meixner, DPM      . triamcinolone acetonide (KENALOG-40) injection 20 mg  20 mg Other Once Asencion Islam, DPM        Allergies  Allergen Reactions  . Demerol Anaphylaxis  . Meperidine Anaphylaxis  . Sulfamethoxazole Itching    With redness to skin   . Citalopram Hydrobromide Other (See  Comments)    Unknown  . Hydrocodone   . Hydrocodone-Acetaminophen Other (See Comments)    Unknown  . Metoprolol Tartrate Other (See Comments)    Chest discomfort, palpations  . Oxycodone-Acetaminophen Other (See Comments)    Unknown  . Diltiazem Rash  . Metoprolol Tartrate Palpitations    Objective: There were no vitals filed for this visit.  General: No acute distress, AAOx3  Right foot: Sutures intact with minimal gapping but no dehiscence at surgical site, all other surgical sites are well healed with minimal scarring, mild swelling to right foot, no erythema, no warmth, no drainage, no signs of infection noted, Capillary fill time <3 seconds in all digits, gross sensation present via light touch to right foot. No pain or crepitation with range of motion right foot.  No pain with calf compression.   Assessment and Plan:  Problem List Items Addressed This Visit    None    Visit Diagnoses    Wound dehiscence    -  Primary   S/P foot surgery, right       Right foot pain       Noncompliance           -Patient seen and evaluated -Applied dry sterile dressing to surgical site right foot secured with Compression sleeve -Advised patient to make sure to keep dressings clean, dry, and intact to right surgical site, removing the compression sleeve as needed .  -Advised patient to continue with post-op shoe on right foot however she comes in wearing tennis shoes today -Advised patient to limit activity to necessity  -Continue with no work until next appointment; At most patient may be able to return with light duty and pending how she is adapting slowly return to full duty. I advised patient if her hip is bothering her that she needs to go to her ortho doctor and have it looked at. I advised patient that at this point it is unknown if temporary disability will be approved. She will have to go to her MD and get a complete evaluation for this.  -Advised patient to ice and elevate as  necessary  -Will plan for suture removal at next office visit if ready. In the meantime, patient to call office if any issues or problems arise.   Asencion Islam, DPM

## 2017-01-22 ENCOUNTER — Other Ambulatory Visit: Payer: Self-pay | Admitting: Sports Medicine

## 2017-01-22 ENCOUNTER — Telehealth: Payer: Self-pay | Admitting: *Deleted

## 2017-01-22 MED ORDER — CIPROFLOXACIN HCL 500 MG PO TABS: 500.0000 mg | ORAL_TABLET | Freq: Two times a day (BID) | ORAL | 0 refills | Status: DC

## 2017-01-22 NOTE — Telephone Encounter (Signed)
Called patient and informed her that Dr Marylene Land called in Cipro to [facility].  Told her to continue to keep the site clean and dry. Advised to keep appointment next week. Patient stated understanding.

## 2017-01-22 NOTE — Telephone Encounter (Signed)
-----   Message from Asencion Islam, [place] sent at 01/22/2017  6:04 PM EDT ----- Contact: [phone] I sent Cipro to her pharmacy  -Dr. Marylene Land  ----- Message ----- From: Warner Mccreedy Sent: 01/22/2017   2:50 PM To: Melody A Ricke Hey, DPM  Patient says that her incision site is oozing and wants to know whether she needs antibiotics.

## 2017-01-22 NOTE — Progress Notes (Signed)
Refilled cipro. -Dr. Marylene Land

## 2017-01-23 ENCOUNTER — Encounter: Payer: BLUE CROSS/BLUE SHIELD | Admitting: Sports Medicine

## 2017-01-28 ENCOUNTER — Encounter: Payer: Self-pay | Admitting: Sports Medicine

## 2017-01-29 ENCOUNTER — Ambulatory Visit (INDEPENDENT_AMBULATORY_CARE_PROVIDER_SITE_OTHER): Payer: Self-pay | Admitting: Sports Medicine

## 2017-01-29 ENCOUNTER — Encounter: Payer: Self-pay | Admitting: Sports Medicine

## 2017-01-29 DIAGNOSIS — T8130XA Disruption of wound, unspecified, initial encounter: Secondary | ICD-10-CM

## 2017-01-29 DIAGNOSIS — M79671 Pain in right foot: Secondary | ICD-10-CM

## 2017-01-29 DIAGNOSIS — Z91199 Patient's noncompliance with other medical treatment and regimen due to unspecified reason: Secondary | ICD-10-CM

## 2017-01-29 DIAGNOSIS — Z9119 Patient's noncompliance with other medical treatment and regimen: Secondary | ICD-10-CM

## 2017-01-29 DIAGNOSIS — Z9889 Other specified postprocedural states: Secondary | ICD-10-CM

## 2017-01-29 NOTE — Progress Notes (Signed)
Subjective: [name] is a 51 y.o. female patient seen today in office for POV #3(DOS 01/05/2017), S/P primary closure of open wound site at the right lateral foot. Patient denies pain at surgical site, reports that she experienced swelling and drainage and went to Urgent care on Saturday to have it check; Still taking antibiotics with no issues. Admits that she has been putting cream on the incision. No other issues noted.   Patient Active Problem List   Diagnosis Date Noted  . Asthma 07/22/2015  . Bulging lumbar disc 07/22/2015  . Carpal tunnel syndrome 07/22/2015  . Chronic fatigue 07/22/2015  . Chronic rhinitis 07/22/2015  . Esophageal stricture 07/22/2015  . Eustachian tube dysfunction 07/22/2015  . High risk medication use 07/22/2015  . Hot flash, menopausal 07/22/2015  . Hyperlipidemia 07/22/2015  . Localized swelling of both lower legs 07/22/2015  . Morbid obesity (HCC) 07/22/2015  . Snores 07/22/2015  . Cardiomyopathy (HCC) 07/18/2015  . Gastroesophageal reflux disease without esophagitis 07/18/2015  . Lung mass   . Hypertension   . Anxiety     Current Outpatient Prescriptions on File Prior to Visit  Medication Sig Dispense Refill  . ALPRAZolam (XANAX) 0.5 MG tablet Take 0.5 mg by mouth 3 (three) times daily as needed.      Marland Kitchen amLODipine (NORVASC) 10 MG tablet Take 10 mg by mouth daily.      Marland Kitchen amLODipine (NORVASC) 5 MG tablet     . amoxicillin-clavulanate (AUGMENTIN) 875-125 MG tablet Take 1 tablet by mouth 2 (two) times daily. 14 tablet 0  . aspirin 81 MG tablet Take 81 mg by mouth daily.      . ciprofloxacin (CIPRO) 500 MG tablet Take 1 tablet (500 mg total) by mouth 2 (two) times daily. 20 tablet 0  . docusate sodium (COLACE) 100 MG capsule Take 100 mg by mouth 2 (two) times daily as needed for mild constipation.    Marland Kitchen esomeprazole (NEXIUM) 20 MG packet Take 20 mg by mouth.    . fluticasone (FLONASE) 50 MCG/ACT nasal spray USE 2 SPRAYS IN EACH NOSTRIL ONCE DAILY     . furosemide (LASIX) 20 MG tablet     . HYDROmorphone (DILAUDID) 4 MG tablet Take 1 tablet (4 mg total) by mouth every 6 (six) hours as needed for severe pain. 28 tablet 0  . Ibuprofen-Famotidine (DUEXIS) 800-26.6 MG TABS Take 1 (one) Tablet by mouth three times daily    . losartan (COZAAR) 50 MG tablet Take 50 mg by mouth.    . meloxicam (MOBIC) 15 MG tablet     . meloxicam (MOBIC) 15 MG tablet Take 15 mg by mouth.    . Multiple Vitamin (MULTIVITAMIN) tablet Take 1 tablet by mouth daily.      . nitroGLYCERIN (NITROSTAT) 0.4 MG SL tablet Place 0.4 mg under the tongue.    Marland Kitchen omeprazole (PRILOSEC) 40 MG capsule Take 40 mg by mouth daily.      Marland Kitchen PROAIR HFA 108 (90 Base) MCG/ACT inhaler     . promethazine (PHENERGAN) 25 MG tablet Take 25 mg by mouth every 8 (eight) hours as needed for nausea or vomiting.    . sertraline (ZOLOFT) 100 MG tablet Take 1 1/2 tablet daily    . sertraline (ZOLOFT) 100 MG tablet     . simvastatin (ZOCOR) 40 MG tablet Take 40 mg by mouth at bedtime.      . traMADol (ULTRAM) 50 MG tablet Take 50 mg by mouth.    . triamcinolone  cream (KENALOG) 0.1 % Apply to affected area bid as needed    . triamcinolone cream (KENALOG) 0.1 %      Current Facility-Administered Medications on File Prior to Visit  Medication Dose Route Frequency Provider Last Rate Last Dose  . triamcinolone acetonide (KENALOG) 10 MG/ML injection 10 mg  10 mg Other Once Asencion Islam, DPM      . triamcinolone acetonide (KENALOG) 10 MG/ML injection 10 mg  10 mg Other Once [place], Kyeisha Janowicz, DPM      . triamcinolone acetonide (KENALOG-40) injection 20 mg  20 mg Other Once Asencion Islam, DPM        Allergies  Allergen Reactions  . Demerol Anaphylaxis  . Meperidine Anaphylaxis  . Sulfamethoxazole Itching    With redness to skin   . Citalopram Hydrobromide Other (See Comments)    Unknown  . Hydrocodone   . Hydrocodone-Acetaminophen Other (See Comments)    Unknown  . Metoprolol Tartrate Other (See  Comments)    Chest discomfort, palpations  . Oxycodone-Acetaminophen Other (See Comments)    Unknown  . Diltiazem Rash  . Metoprolol Tartrate Palpitations    Objective: There were no vitals filed for this visit.  General: No acute distress, AAOx3  Right foot: Sutures intact with minimal gapping proximal but no dehiscence at surgical site, all other surgical sites are well healed with minimal scarring, mild swelling to right foot, no erythema, no warmth, no drainage, no signs of infection noted, Capillary fill time <3 seconds in all digits, gross sensation present via light touch to right foot. No pain or crepitation with range of motion right foot.  No pain with calf compression.   Assessment and Plan:  Problem List Items Addressed This Visit    None    Visit Diagnoses    Wound dehiscence    -  Primary   S/P foot surgery, right       Right foot pain       Noncompliance           -Patient seen and evaluated -Applied dry sterile dressing to surgical site right foot secured with Compression sleeve -Advised patient to make sure to keep dressings clean, dry, and intact to right surgical site, removing the compression sleeve as needed. Advised patient that if she removes dressing to refrain from putting cream on it. Only allowed to use betadine -Advised patient to continue with post-op shoe on right foot however she comes in wearing tennis shoes today as previous -Continue with Cipro until completed  -Advised patient to limit activity to necessity  -Continue with no work since incision is not completely healed -Advised patient to ice and elevate as necessary  -Will plan for suture removal at next office visit if ready. In the meantime, patient to call office if any issues or problems arise.   Asencion Islam, DPM

## 2017-02-06 ENCOUNTER — Encounter: Payer: Self-pay | Admitting: Sports Medicine

## 2017-02-06 ENCOUNTER — Ambulatory Visit (INDEPENDENT_AMBULATORY_CARE_PROVIDER_SITE_OTHER): Payer: Self-pay | Admitting: Sports Medicine

## 2017-02-06 DIAGNOSIS — T8130XA Disruption of wound, unspecified, initial encounter: Secondary | ICD-10-CM

## 2017-02-06 DIAGNOSIS — Z9889 Other specified postprocedural states: Secondary | ICD-10-CM

## 2017-02-06 DIAGNOSIS — M79671 Pain in right foot: Secondary | ICD-10-CM

## 2017-02-06 NOTE — Progress Notes (Signed)
Subjective: [name] is a 51 y.o. female patient seen today in office for POV #4 (DOS 01/05/2017), S/P primary closure of open wound site at the right lateral foot. Patient denies pain at surgical site, reports that swelling is better, no drainage from area and has finished antibiotics with no issues. No other issues noted.   Patient Active Problem List   Diagnosis Date Noted  . Asthma 07/22/2015  . Bulging lumbar disc 07/22/2015  . Carpal tunnel syndrome 07/22/2015  . Chronic fatigue 07/22/2015  . Chronic rhinitis 07/22/2015  . Esophageal stricture 07/22/2015  . Eustachian tube dysfunction 07/22/2015  . High risk medication use 07/22/2015  . Hot flash, menopausal 07/22/2015  . Hyperlipidemia 07/22/2015  . Localized swelling of both lower legs 07/22/2015  . Morbid obesity (HCC) 07/22/2015  . Snores 07/22/2015  . Cardiomyopathy (HCC) 07/18/2015  . Gastroesophageal reflux disease without esophagitis 07/18/2015  . Lung mass   . Hypertension   . Anxiety     Current Outpatient Prescriptions on File Prior to Visit  Medication Sig Dispense Refill  . ALPRAZolam (XANAX) 0.5 MG tablet Take 0.5 mg by mouth 3 (three) times daily as needed.      Marland Kitchen amLODipine (NORVASC) 10 MG tablet Take 10 mg by mouth daily.      Marland Kitchen amLODipine (NORVASC) 5 MG tablet     . amoxicillin-clavulanate (AUGMENTIN) 875-125 MG tablet Take 1 tablet by mouth 2 (two) times daily. 14 tablet 0  . aspirin 81 MG tablet Take 81 mg by mouth daily.      . ciprofloxacin (CIPRO) 500 MG tablet Take 1 tablet (500 mg total) by mouth 2 (two) times daily. 20 tablet 0  . docusate sodium (COLACE) 100 MG capsule Take 100 mg by mouth 2 (two) times daily as needed for mild constipation.    Marland Kitchen esomeprazole (NEXIUM) 20 MG packet Take 20 mg by mouth.    . fluticasone (FLONASE) 50 MCG/ACT nasal spray USE 2 SPRAYS IN EACH NOSTRIL ONCE DAILY    . furosemide (LASIX) 20 MG tablet     . HYDROmorphone (DILAUDID) 4 MG tablet Take 1 tablet (4 mg  total) by mouth every 6 (six) hours as needed for severe pain. 28 tablet 0  . Ibuprofen-Famotidine (DUEXIS) 800-26.6 MG TABS Take 1 (one) Tablet by mouth three times daily    . losartan (COZAAR) 50 MG tablet Take 50 mg by mouth.    . meloxicam (MOBIC) 15 MG tablet     . meloxicam (MOBIC) 15 MG tablet Take 15 mg by mouth.    . Multiple Vitamin (MULTIVITAMIN) tablet Take 1 tablet by mouth daily.      . nitroGLYCERIN (NITROSTAT) 0.4 MG SL tablet Place 0.4 mg under the tongue.    Marland Kitchen omeprazole (PRILOSEC) 40 MG capsule Take 40 mg by mouth daily.      Marland Kitchen PROAIR HFA 108 (90 Base) MCG/ACT inhaler     . promethazine (PHENERGAN) 25 MG tablet Take 25 mg by mouth every 8 (eight) hours as needed for nausea or vomiting.    . sertraline (ZOLOFT) 100 MG tablet Take 1 1/2 tablet daily    . sertraline (ZOLOFT) 100 MG tablet     . simvastatin (ZOCOR) 40 MG tablet Take 40 mg by mouth at bedtime.      . traMADol (ULTRAM) 50 MG tablet Take 50 mg by mouth.    . triamcinolone cream (KENALOG) 0.1 % Apply to affected area bid as needed    . triamcinolone cream (  KENALOG) 0.1 %      Current Facility-Administered Medications on File Prior to Visit  Medication Dose Route Frequency Provider Last Rate Last Dose  . triamcinolone acetonide (KENALOG) 10 MG/ML injection 10 mg  10 mg Other Once Asencion Islam, DPM      . triamcinolone acetonide (KENALOG) 10 MG/ML injection 10 mg  10 mg Other Once [place], Dorcas Melito, DPM      . triamcinolone acetonide (KENALOG-40) injection 20 mg  20 mg Other Once Asencion Islam, DPM        Allergies  Allergen Reactions  . Demerol Anaphylaxis  . Meperidine Anaphylaxis  . Sulfamethoxazole Itching    With redness to skin   . Citalopram Hydrobromide Other (See Comments)    Unknown  . Hydrocodone   . Hydrocodone-Acetaminophen Other (See Comments)    Unknown  . Metoprolol Tartrate Other (See Comments)    Chest discomfort, palpations  . Oxycodone-Acetaminophen Other (See Comments)    Unknown   . Diltiazem Rash  . Metoprolol Tartrate Palpitations    Objective: There were no vitals filed for this visit.  General: No acute distress, AAOx3  Right foot: Sutures intact with no gapping or dehiscence at surgical site, there is surrounding scabbing, all other surgical sites are well healed with minimal scarring, mild swelling to right foot, no erythema, no warmth, no drainage, no signs of infection noted, Capillary fill time <3 seconds in all digits, gross sensation present via light touch to right foot. No pain or crepitation with range of motion right foot however subjectively difficult to walk and stand long periods because of swelling and pain.  No pain with calf compression.   Assessment and Plan:  Problem List Items Addressed This Visit    None    Visit Diagnoses    Wound dehiscence    -  Primary   S/P foot surgery, right       Right foot pain          -Patient seen and evaluated -Sutures removed. Applied dry sterile dressing to surgical site right foot. Patient may shower on tomorrow. Advised patient to refrain from putting creams to the incision until the scab has fallen off. -Continue with compression sleeve -Advised patient to limit activity to tolerance -Work note given to return with light duty with no excessive walking and standing beyond 20 minutes at a time. This duty cannot be accommodated and patient will be unable to return to work due to her foot pain. Patient's return to work status will be reassessed at next office visit. -Advised patient to ice and elevate as necessary  -Will plan forfinal x-ray, an incision check  at next office. In the meantime, patient to call office if any issues or problems arise.   Asencion Islam, DPM

## 2017-02-18 ENCOUNTER — Encounter: Payer: Self-pay | Admitting: Sports Medicine

## 2017-02-18 NOTE — Progress Notes (Signed)
DOS 01/05/2017 WOUND DEHISCENCE DEBRIDEMENT WITH CLOSURE RIGHT

## 2017-02-19 ENCOUNTER — Ambulatory Visit (INDEPENDENT_AMBULATORY_CARE_PROVIDER_SITE_OTHER): Payer: BLUE CROSS/BLUE SHIELD

## 2017-02-19 ENCOUNTER — Encounter (INDEPENDENT_AMBULATORY_CARE_PROVIDER_SITE_OTHER): Payer: Self-pay

## 2017-02-19 ENCOUNTER — Encounter: Payer: Self-pay | Admitting: Sports Medicine

## 2017-02-19 ENCOUNTER — Ambulatory Visit (INDEPENDENT_AMBULATORY_CARE_PROVIDER_SITE_OTHER): Payer: BLUE CROSS/BLUE SHIELD | Admitting: Sports Medicine

## 2017-02-19 DIAGNOSIS — Z9889 Other specified postprocedural states: Secondary | ICD-10-CM

## 2017-02-19 DIAGNOSIS — M25471 Effusion, right ankle: Secondary | ICD-10-CM

## 2017-02-19 DIAGNOSIS — M79671 Pain in right foot: Secondary | ICD-10-CM

## 2017-02-19 DIAGNOSIS — T8130XA Disruption of wound, unspecified, initial encounter: Secondary | ICD-10-CM | POA: Diagnosis not present

## 2017-02-19 MED ORDER — METHYLPREDNISOLONE 4 MG PO TBPK: ORAL_TABLET | ORAL | 0 refills | Status: DC

## 2017-02-19 MED ORDER — TRAMADOL HCL 50 MG PO TABS: 50.0000 mg | ORAL_TABLET | Freq: Three times a day (TID) | ORAL | 0 refills | Status: AC | PRN

## 2017-02-19 NOTE — Progress Notes (Signed)
Subjective: [name] is a 51 y.o. female patient seen today in office for POV #5 (DOS 01/05/2017), S/P primary closure of open wound site at the right lateral foot. Patient states that she went to a concert on Saturday in the a lot of walking and standing and since slowly progressively has developed some swelling. States that she was massaging out the swelling in her right foot and ankle and noticed some drainage from the previous surgical site where incision was sutured back closed, admits that she does have some ankle pain which is new in nature. Denies any known acute trauma or injury. No other issues noted.   Patient Active Problem List   Diagnosis Date Noted  . Asthma 07/22/2015  . Bulging lumbar disc 07/22/2015  . Carpal tunnel syndrome 07/22/2015  . Chronic fatigue 07/22/2015  . Chronic rhinitis 07/22/2015  . Esophageal stricture 07/22/2015  . Eustachian tube dysfunction 07/22/2015  . High risk medication use 07/22/2015  . Hot flash, menopausal 07/22/2015  . Hyperlipidemia 07/22/2015  . Localized swelling of both lower legs 07/22/2015  . Morbid obesity (HCC) 07/22/2015  . Snores 07/22/2015  . Cardiomyopathy (HCC) 07/18/2015  . Gastroesophageal reflux disease without esophagitis 07/18/2015  . Lung mass   . Hypertension   . Anxiety     Current Outpatient Prescriptions on File Prior to Visit  Medication Sig Dispense Refill  . ALPRAZolam (XANAX) 0.5 MG tablet Take 0.5 mg by mouth 3 (three) times daily as needed.      Marland Kitchen amLODipine (NORVASC) 10 MG tablet Take 10 mg by mouth daily.      Marland Kitchen amLODipine (NORVASC) 5 MG tablet     . amoxicillin-clavulanate (AUGMENTIN) 875-125 MG tablet Take 1 tablet by mouth 2 (two) times daily. 14 tablet 0  . aspirin 81 MG tablet Take 81 mg by mouth daily.      . ciprofloxacin (CIPRO) 500 MG tablet Take 1 tablet (500 mg total) by mouth 2 (two) times daily. 20 tablet 0  . docusate sodium (COLACE) 100 MG capsule Take 100 mg by mouth 2 (two) times  daily as needed for mild constipation.    Marland Kitchen esomeprazole (NEXIUM) 20 MG packet Take 20 mg by mouth.    . fluticasone (FLONASE) 50 MCG/ACT nasal spray USE 2 SPRAYS IN EACH NOSTRIL ONCE DAILY    . furosemide (LASIX) 20 MG tablet     . HYDROmorphone (DILAUDID) 4 MG tablet Take 1 tablet (4 mg total) by mouth every 6 (six) hours as needed for severe pain. 28 tablet 0  . Ibuprofen-Famotidine (DUEXIS) 800-26.6 MG TABS Take 1 (one) Tablet by mouth three times daily    . losartan (COZAAR) 50 MG tablet Take 50 mg by mouth.    . meloxicam (MOBIC) 15 MG tablet     . meloxicam (MOBIC) 15 MG tablet Take 15 mg by mouth.    . Multiple Vitamin (MULTIVITAMIN) tablet Take 1 tablet by mouth daily.      . nitroGLYCERIN (NITROSTAT) 0.4 MG SL tablet Place 0.4 mg under the tongue.    Marland Kitchen omeprazole (PRILOSEC) 40 MG capsule Take 40 mg by mouth daily.      Marland Kitchen PROAIR HFA 108 (90 Base) MCG/ACT inhaler     . promethazine (PHENERGAN) 25 MG tablet Take 25 mg by mouth every 8 (eight) hours as needed for nausea or vomiting.    . sertraline (ZOLOFT) 100 MG tablet Take 1 1/2 tablet daily    . sertraline (ZOLOFT) 100 MG tablet     .  simvastatin (ZOCOR) 40 MG tablet Take 40 mg by mouth at bedtime.      . triamcinolone cream (KENALOG) 0.1 % Apply to affected area bid as needed    . triamcinolone cream (KENALOG) 0.1 %      Current Facility-Administered Medications on File Prior to Visit  Medication Dose Route Frequency Provider Last Rate Last Dose  . triamcinolone acetonide (KENALOG) 10 MG/ML injection 10 mg  10 mg Other Once Asencion Islam, DPM      . triamcinolone acetonide (KENALOG) 10 MG/ML injection 10 mg  10 mg Other Once [place], Ebelin Dillehay, DPM      . triamcinolone acetonide (KENALOG-40) injection 20 mg  20 mg Other Once Asencion Islam, DPM        Allergies  Allergen Reactions  . Demerol Anaphylaxis  . Meperidine Anaphylaxis  . Sulfamethoxazole Itching    With redness to skin   . Citalopram Hydrobromide Other (See  Comments)    Unknown  . Hydrocodone   . Hydrocodone-Acetaminophen Other (See Comments)    Unknown  . Metoprolol Tartrate Other (See Comments)    Chest discomfort, palpations  . Oxycodone-Acetaminophen Other (See Comments)    Unknown  . Diltiazem Rash  . Metoprolol Tartrate Palpitations    Objective: There were no vitals filed for this visit.  General: No acute distress, AAOx3  Right foot:Dorsal lateral right foot incision intact, there is surrounding scabbing, all other surgical sites are well healed with minimal scarring, mild swelling to right foot, no erythema, no warmth, no drainage, no signs of infection noted, Capillary fill time <3 seconds in all digits, gross sensation present via light touch to right foot. No pain or crepitation with range of motion right foot however subjectively difficult to walk and stand long periods because of swelling and pain, with mild reproducible pain at right lateral ankle.  No pain with calf compression.   X-rays right foot hardware intact. Unchanged areas of arthritis. Mild soft tissue swelling. No other acute findings.  Assessment and Plan:  Problem List Items Addressed This Visit    None    Visit Diagnoses    Wound dehiscence    -  Primary   Relevant Medications   methylPREDNISolone (MEDROL DOSEPAK) 4 MG TBPK tablet   Other Relevant Orders   DG Foot Complete Right   Swelling of ankle, right       Relevant Medications   methylPREDNISolone (MEDROL DOSEPAK) 4 MG TBPK tablet   S/P foot surgery, right       Relevant Medications   methylPREDNISolone (MEDROL DOSEPAK) 4 MG TBPK tablet   Right foot pain       Relevant Medications   traMADol (ULTRAM) 50 MG tablet   methylPREDNISolone (MEDROL DOSEPAK) 4 MG TBPK tablet      -Patient seen and evaluated -X-rays reviewed -Applied. Soft cast /Unna boot to right lower extremity. Advised patient to return to her cam boot for the next week. Patient may remove the Unna boot after 5 days and wash leg.  Encourage rest, ice, elevation. -Prescribed tramadol to take as needed for pain and Medrol Dosepak for inflammation -Work note given for light duty. If this duty cannot be accommodated and patient will be unable to return to work due to her foot pain.  -Will plan for reassessment of foot and ankle pain and  incision check in 1 week at next office. In the meantime, patient to call office if any issues or problems arise.   Asencion Islam, DPM

## 2017-02-19 NOTE — Patient Instructions (Signed)
Keep soft cast intact for 5 days and then remove and wash leg. Continue with CAM boot until next visit

## 2017-02-26 ENCOUNTER — Encounter: Payer: BLUE CROSS/BLUE SHIELD | Admitting: Sports Medicine

## 2017-02-26 ENCOUNTER — Ambulatory Visit (INDEPENDENT_AMBULATORY_CARE_PROVIDER_SITE_OTHER): Payer: Self-pay | Admitting: Sports Medicine

## 2017-02-26 DIAGNOSIS — T8130XA Disruption of wound, unspecified, initial encounter: Secondary | ICD-10-CM

## 2017-02-26 DIAGNOSIS — M25471 Effusion, right ankle: Secondary | ICD-10-CM

## 2017-02-26 DIAGNOSIS — M79671 Pain in right foot: Secondary | ICD-10-CM

## 2017-02-26 DIAGNOSIS — Z9889 Other specified postprocedural states: Secondary | ICD-10-CM

## 2017-02-26 NOTE — Progress Notes (Signed)
Subjective: [name] is a 51 y.o. female patient seen today in office for POV #6 (DOS 01/05/2017), S/P primary closure of open wound site at the right lateral foot. Patient states that the unna boot helped with the pain and swelling and that she is doing good. No other issues noted.   Patient Active Problem List   Diagnosis Date Noted  . Asthma 07/22/2015  . Bulging lumbar disc 07/22/2015  . Carpal tunnel syndrome 07/22/2015  . Chronic fatigue 07/22/2015  . Chronic rhinitis 07/22/2015  . Esophageal stricture 07/22/2015  . Eustachian tube dysfunction 07/22/2015  . High risk medication use 07/22/2015  . Hot flash, menopausal 07/22/2015  . Hyperlipidemia 07/22/2015  . Localized swelling of both lower legs 07/22/2015  . Morbid obesity (HCC) 07/22/2015  . Snores 07/22/2015  . Cardiomyopathy (HCC) 07/18/2015  . Gastroesophageal reflux disease without esophagitis 07/18/2015  . Lung mass   . Hypertension   . Anxiety     Current Outpatient Prescriptions on File Prior to Visit  Medication Sig Dispense Refill  . ALPRAZolam (XANAX) 0.5 MG tablet Take 0.5 mg by mouth 3 (three) times daily as needed.      Marland Kitchen amLODipine (NORVASC) 10 MG tablet Take 10 mg by mouth daily.      Marland Kitchen amLODipine (NORVASC) 5 MG tablet     . amoxicillin-clavulanate (AUGMENTIN) 875-125 MG tablet Take 1 tablet by mouth 2 (two) times daily. 14 tablet 0  . aspirin 81 MG tablet Take 81 mg by mouth daily.      . ciprofloxacin (CIPRO) 500 MG tablet Take 1 tablet (500 mg total) by mouth 2 (two) times daily. 20 tablet 0  . docusate sodium (COLACE) 100 MG capsule Take 100 mg by mouth 2 (two) times daily as needed for mild constipation.    Marland Kitchen esomeprazole (NEXIUM) 20 MG packet Take 20 mg by mouth.    . fluticasone (FLONASE) 50 MCG/ACT nasal spray USE 2 SPRAYS IN EACH NOSTRIL ONCE DAILY    . furosemide (LASIX) 20 MG tablet     . HYDROmorphone (DILAUDID) 4 MG tablet Take 1 tablet (4 mg total) by mouth every 6 (six) hours as needed  for severe pain. 28 tablet 0  . Ibuprofen-Famotidine (DUEXIS) 800-26.6 MG TABS Take 1 (one) Tablet by mouth three times daily    . losartan (COZAAR) 50 MG tablet Take 50 mg by mouth.    . meloxicam (MOBIC) 15 MG tablet     . meloxicam (MOBIC) 15 MG tablet Take 15 mg by mouth.    . methylPREDNISolone (MEDROL DOSEPAK) 4 MG TBPK tablet Take as instructed 21 tablet 0  . Multiple Vitamin (MULTIVITAMIN) tablet Take 1 tablet by mouth daily.      . nitroGLYCERIN (NITROSTAT) 0.4 MG SL tablet Place 0.4 mg under the tongue.    Marland Kitchen omeprazole (PRILOSEC) 40 MG capsule Take 40 mg by mouth daily.      Marland Kitchen PROAIR HFA 108 (90 Base) MCG/ACT inhaler     . promethazine (PHENERGAN) 25 MG tablet Take 25 mg by mouth every 8 (eight) hours as needed for nausea or vomiting.    . sertraline (ZOLOFT) 100 MG tablet Take 1 1/2 tablet daily    . sertraline (ZOLOFT) 100 MG tablet     . simvastatin (ZOCOR) 40 MG tablet Take 40 mg by mouth at bedtime.      . traMADol (ULTRAM) 50 MG tablet Take 1 tablet (50 mg total) by mouth every 8 (eight) hours as needed. 21 tablet  0  . triamcinolone cream (KENALOG) 0.1 % Apply to affected area bid as needed    . triamcinolone cream (KENALOG) 0.1 %      Current Facility-Administered Medications on File Prior to Visit  Medication Dose Route Frequency Provider Last Rate Last Dose  . triamcinolone acetonide (KENALOG) 10 MG/ML injection 10 mg  10 mg Other Once Asencion Islam, DPM      . triamcinolone acetonide (KENALOG) 10 MG/ML injection 10 mg  10 mg Other Once [place], Jeno Calleros, DPM      . triamcinolone acetonide (KENALOG-40) injection 20 mg  20 mg Other Once Asencion Islam, DPM        Allergies  Allergen Reactions  . Demerol Anaphylaxis  . Meperidine Anaphylaxis  . Sulfamethoxazole Itching    With redness to skin   . Citalopram Hydrobromide Other (See Comments)    Unknown  . Hydrocodone   . Hydrocodone-Acetaminophen Other (See Comments)    Unknown  . Metoprolol Tartrate Other (See  Comments)    Chest discomfort, palpations  . Oxycodone-Acetaminophen Other (See Comments)    Unknown  . Diltiazem Rash  . Metoprolol Tartrate Palpitations    Objective: There were no vitals filed for this visit.  General: No acute distress, AAOx3  Right foot:Dorsal lateral right foot incision intact,except where there is a small pinpoint area centrally where she placed a steristrip, all other surgical sites are well healed with minimal scarring, decreased swelling to right foot, no erythema, no warmth, no drainage, no signs of infection noted, Capillary fill time <3 seconds in all digits, gross sensation present via light touch to right foot. No pain or crepitation with range of motion right foot on today's exam. No pain with calf compression.   Assessment and Plan:  Problem List Items Addressed This Visit    None    Visit Diagnoses    Wound dehiscence    -  Primary   Swelling of ankle, right       S/P foot surgery, right       Right foot pain          -Patient seen and evaluated -Applied steristrips to right lateral incision site and advised patient to do the same if any of the strips fall off -Continue with activities to tolerance and good supportive shoes daily  -Will plan for reassessment of foot and ankle pain and  incision check in 2 weeks at next office. In the meantime, patient to call office if any issues or problems arise.   Asencion Islam, DPM

## 2017-02-27 DIAGNOSIS — Z13820 Encounter for screening for osteoporosis: Secondary | ICD-10-CM | POA: Insufficient documentation

## 2017-02-27 HISTORY — DX: Encounter for screening for osteoporosis: Z13.820

## 2017-03-10 DIAGNOSIS — Z124 Encounter for screening for malignant neoplasm of cervix: Secondary | ICD-10-CM | POA: Insufficient documentation

## 2017-03-11 ENCOUNTER — Ambulatory Visit (INDEPENDENT_AMBULATORY_CARE_PROVIDER_SITE_OTHER): Payer: BLUE CROSS/BLUE SHIELD | Admitting: Sports Medicine

## 2017-03-11 DIAGNOSIS — Z9889 Other specified postprocedural states: Secondary | ICD-10-CM | POA: Diagnosis not present

## 2017-03-11 DIAGNOSIS — L03119 Cellulitis of unspecified part of limb: Secondary | ICD-10-CM | POA: Diagnosis not present

## 2017-03-11 DIAGNOSIS — Z9119 Patient's noncompliance with other medical treatment and regimen: Secondary | ICD-10-CM

## 2017-03-11 DIAGNOSIS — M79676 Pain in unspecified toe(s): Secondary | ICD-10-CM

## 2017-03-11 DIAGNOSIS — M79671 Pain in right foot: Secondary | ICD-10-CM | POA: Diagnosis not present

## 2017-03-11 DIAGNOSIS — L02619 Cutaneous abscess of unspecified foot: Secondary | ICD-10-CM

## 2017-03-11 DIAGNOSIS — Z91199 Patient's noncompliance with other medical treatment and regimen due to unspecified reason: Secondary | ICD-10-CM

## 2017-03-11 DIAGNOSIS — T8130XA Disruption of wound, unspecified, initial encounter: Secondary | ICD-10-CM

## 2017-03-11 MED ORDER — CIPROFLOXACIN HCL 500 MG PO TABS: 500.0000 mg | ORAL_TABLET | Freq: Two times a day (BID) | ORAL | 0 refills | Status: DC

## 2017-03-11 NOTE — Progress Notes (Addendum)
Subjective: [name] is a 51 y.o. female patient seen today in office for POV #7 (DOS 01/05/2017), S/P primary closure of open wound site at the right lateral foot. Patient states that on Saturday noticed the incision area starting to swell and then on Monday the area started to drain a clear to yellow fluid. Patient reports that she replaced the Steri-Strips over the area and is having a little pain at the surgical site that extends out as tingling to fourth and fifth toes. Patient denies nausea, vomiting, fever, chills or any other constitutional symptoms at this time. No other issues noted.   Patient Active Problem List   Diagnosis Date Noted  . Asthma 07/22/2015  . Bulging lumbar disc 07/22/2015  . Carpal tunnel syndrome 07/22/2015  . Chronic fatigue 07/22/2015  . Chronic rhinitis 07/22/2015  . Esophageal stricture 07/22/2015  . Eustachian tube dysfunction 07/22/2015  . High risk medication use 07/22/2015  . Hot flash, menopausal 07/22/2015  . Hyperlipidemia 07/22/2015  . Localized swelling of both lower legs 07/22/2015  . Morbid obesity (HCC) 07/22/2015  . Snores 07/22/2015  . Cardiomyopathy (HCC) 07/18/2015  . Gastroesophageal reflux disease without esophagitis 07/18/2015  . Lung mass   . Hypertension   . Anxiety     Current Outpatient Prescriptions on File Prior to Visit  Medication Sig Dispense Refill  . ALPRAZolam (XANAX) 0.5 MG tablet Take 0.5 mg by mouth 3 (three) times daily as needed.      Marland Kitchen amLODipine (NORVASC) 10 MG tablet Take 10 mg by mouth daily.      Marland Kitchen amLODipine (NORVASC) 5 MG tablet     . amoxicillin-clavulanate (AUGMENTIN) 875-125 MG tablet Take 1 tablet by mouth 2 (two) times daily. 14 tablet 0  . aspirin 81 MG tablet Take 81 mg by mouth daily.      Marland Kitchen docusate sodium (COLACE) 100 MG capsule Take 100 mg by mouth 2 (two) times daily as needed for mild constipation.    Marland Kitchen esomeprazole (NEXIUM) 20 MG packet Take 20 mg by mouth.    . fluticasone (FLONASE) 50  MCG/ACT nasal spray USE 2 SPRAYS IN EACH NOSTRIL ONCE DAILY    . furosemide (LASIX) 20 MG tablet     . HYDROmorphone (DILAUDID) 4 MG tablet Take 1 tablet (4 mg total) by mouth every 6 (six) hours as needed for severe pain. 28 tablet 0  . Ibuprofen-Famotidine (DUEXIS) 800-26.6 MG TABS Take 1 (one) Tablet by mouth three times daily    . losartan (COZAAR) 50 MG tablet Take 50 mg by mouth.    . meloxicam (MOBIC) 15 MG tablet     . meloxicam (MOBIC) 15 MG tablet Take 15 mg by mouth.    . methylPREDNISolone (MEDROL DOSEPAK) 4 MG TBPK tablet Take as instructed 21 tablet 0  . Multiple Vitamin (MULTIVITAMIN) tablet Take 1 tablet by mouth daily.      . nitroGLYCERIN (NITROSTAT) 0.4 MG SL tablet Place 0.4 mg under the tongue.    Marland Kitchen omeprazole (PRILOSEC) 40 MG capsule Take 40 mg by mouth daily.      Marland Kitchen PROAIR HFA 108 (90 Base) MCG/ACT inhaler     . promethazine (PHENERGAN) 25 MG tablet Take 25 mg by mouth every 8 (eight) hours as needed for nausea or vomiting.    . sertraline (ZOLOFT) 100 MG tablet Take 1 1/2 tablet daily    . sertraline (ZOLOFT) 100 MG tablet     . simvastatin (ZOCOR) 40 MG tablet Take 40 mg by  mouth at bedtime.      . triamcinolone cream (KENALOG) 0.1 % Apply to affected area bid as needed    . triamcinolone cream (KENALOG) 0.1 %      Current Facility-Administered Medications on File Prior to Visit  Medication Dose Route Frequency Provider Last Rate Last Dose  . triamcinolone acetonide (KENALOG) 10 MG/ML injection 10 mg  10 mg Other Once Asencion Islam, DPM      . triamcinolone acetonide (KENALOG) 10 MG/ML injection 10 mg  10 mg Other Once [place], Timarie Labell, DPM      . triamcinolone acetonide (KENALOG-40) injection 20 mg  20 mg Other Once Asencion Islam, DPM        Allergies  Allergen Reactions  . Demerol Anaphylaxis  . Meperidine Anaphylaxis  . Sulfamethoxazole Itching    With redness to skin   . Citalopram Hydrobromide Other (See Comments)    Unknown  . Hydrocodone   .  Hydrocodone-Acetaminophen Other (See Comments)    Unknown  . Metoprolol Tartrate Other (See Comments)    Chest discomfort, palpations  . Oxycodone-Acetaminophen Other (See Comments)    Unknown  . Diltiazem Rash  . Metoprolol Tartrate Palpitations    Objective: There were no vitals filed for this visit.  General: No acute distress, AAOx3  Right foot:Dorsal lateral right foot incision Has a small fluctuant area at the central aspect that has a blister roof. Once lanced clear fluid was evacuated, revealing a granular wound base that measures 1 x 0.5 x 0.3 cm, all other surgical sites are well healed with minimal scarring, decreased swelling to right foot, no diffuse erythema, no warmth,  no other signs of infection noted, Capillary fill time <3 seconds in all digits, gross sensation present via light touch to right foot. Mild pain to palpation to right dorsal lateral foot incision site. No pain with calf compression.   Assessment and Plan:  Problem List Items Addressed This Visit    None    Visit Diagnoses    Wound dehiscence    -  Primary   Cellulitis and abscess of foot, except toes       Relevant Medications   ciprofloxacin (CIPRO) 500 MG tablet   S/P foot surgery, right       Right foot pain       Noncompliance          -Patient seen and evaluated -After Betadine prep and verbal consent. Using a tissue current. The blister over the incision line was then lanced and the area was thoroughly cleansed with wound cleanser and packed open with quarter inch plain packing and dressed with 4 x 4 gauze and compressive Coban dressing. Advised patient to do the same daily. -Prescribed ciprofloxacin for preventative measures -Recommend patient to refrain from work until next office visit -Recommend loosefitting shoe on return to postoperative shoe to prevent rubbing at dorsal lateral foot incision -Advised patient that we will have to let this heal in by secondary intention -Will plan for  reassessment of foot and ankle pain and incision check in 2 weeks at next office. In the meantime, patient to call office if any issues or problems arise.  Clarification- refrain from work if no light duty option is available with her job to prevent worsening of dehiscence   Asencion Islam, DPM

## 2017-03-12 ENCOUNTER — Encounter: Payer: BLUE CROSS/BLUE SHIELD | Admitting: Sports Medicine

## 2017-03-12 ENCOUNTER — Encounter: Payer: Self-pay | Admitting: Sports Medicine

## 2017-03-20 ENCOUNTER — Encounter: Payer: BLUE CROSS/BLUE SHIELD | Admitting: Sports Medicine

## 2017-03-26 ENCOUNTER — Encounter: Payer: Self-pay | Admitting: Sports Medicine

## 2017-03-26 ENCOUNTER — Ambulatory Visit (INDEPENDENT_AMBULATORY_CARE_PROVIDER_SITE_OTHER): Payer: BLUE CROSS/BLUE SHIELD | Admitting: Sports Medicine

## 2017-03-26 DIAGNOSIS — Z9889 Other specified postprocedural states: Secondary | ICD-10-CM

## 2017-03-26 DIAGNOSIS — T8130XA Disruption of wound, unspecified, initial encounter: Secondary | ICD-10-CM

## 2017-03-26 DIAGNOSIS — M25471 Effusion, right ankle: Secondary | ICD-10-CM

## 2017-03-26 DIAGNOSIS — M79671 Pain in right foot: Secondary | ICD-10-CM

## 2017-03-26 NOTE — Progress Notes (Signed)
Subjective: [name] is a 51 y.o. female patient seen today in office for POV #8 (DOS 01/05/2017), S/P primary closure of open wound site at the right lateral foot. Patient states that she dressed it as instructed and that area is better however still experiencing some pain and swelling. Patient denies nausea, vomiting, fever, chills or any other constitutional symptoms at this time. No other issues noted.   Patient Active Problem List   Diagnosis Date Noted  . Asthma 07/22/2015  . Bulging lumbar disc 07/22/2015  . Carpal tunnel syndrome 07/22/2015  . Chronic fatigue 07/22/2015  . Chronic rhinitis 07/22/2015  . Esophageal stricture 07/22/2015  . Eustachian tube dysfunction 07/22/2015  . High risk medication use 07/22/2015  . Hot flash, menopausal 07/22/2015  . Hyperlipidemia 07/22/2015  . Localized swelling of both lower legs 07/22/2015  . Morbid obesity (HCC) 07/22/2015  . Snores 07/22/2015  . Cardiomyopathy (HCC) 07/18/2015  . Gastroesophageal reflux disease without esophagitis 07/18/2015  . Lung mass   . Hypertension   . Anxiety     Current Outpatient Prescriptions on File Prior to Visit  Medication Sig Dispense Refill  . ALPRAZolam (XANAX) 0.5 MG tablet Take 0.5 mg by mouth 3 (three) times daily as needed.      Marland Kitchen amLODipine (NORVASC) 10 MG tablet Take 10 mg by mouth daily.      Marland Kitchen amLODipine (NORVASC) 5 MG tablet     . amoxicillin-clavulanate (AUGMENTIN) 875-125 MG tablet Take 1 tablet by mouth 2 (two) times daily. 14 tablet 0  . aspirin 81 MG tablet Take 81 mg by mouth daily.      . ciprofloxacin (CIPRO) 500 MG tablet Take 1 tablet (500 mg total) by mouth 2 (two) times daily. 20 tablet 0  . docusate sodium (COLACE) 100 MG capsule Take 100 mg by mouth 2 (two) times daily as needed for mild constipation.    Marland Kitchen esomeprazole (NEXIUM) 20 MG packet Take 20 mg by mouth.    . fluticasone (FLONASE) 50 MCG/ACT nasal spray USE 2 SPRAYS IN EACH NOSTRIL ONCE DAILY    . furosemide  (LASIX) 20 MG tablet     . HYDROmorphone (DILAUDID) 4 MG tablet Take 1 tablet (4 mg total) by mouth every 6 (six) hours as needed for severe pain. 28 tablet 0  . Ibuprofen-Famotidine (DUEXIS) 800-26.6 MG TABS Take 1 (one) Tablet by mouth three times daily    . losartan (COZAAR) 50 MG tablet Take 50 mg by mouth.    . meloxicam (MOBIC) 15 MG tablet     . meloxicam (MOBIC) 15 MG tablet Take 15 mg by mouth.    . methylPREDNISolone (MEDROL DOSEPAK) 4 MG TBPK tablet Take as instructed 21 tablet 0  . Multiple Vitamin (MULTIVITAMIN) tablet Take 1 tablet by mouth daily.      . nitroGLYCERIN (NITROSTAT) 0.4 MG SL tablet Place 0.4 mg under the tongue.    Marland Kitchen omeprazole (PRILOSEC) 40 MG capsule Take 40 mg by mouth daily.      Marland Kitchen PROAIR HFA 108 (90 Base) MCG/ACT inhaler     . promethazine (PHENERGAN) 25 MG tablet Take 25 mg by mouth every 8 (eight) hours as needed for nausea or vomiting.    . sertraline (ZOLOFT) 100 MG tablet Take 1 1/2 tablet daily    . sertraline (ZOLOFT) 100 MG tablet     . simvastatin (ZOCOR) 40 MG tablet Take 40 mg by mouth at bedtime.      . triamcinolone cream (KENALOG) 0.1 %  Apply to affected area bid as needed    . triamcinolone cream (KENALOG) 0.1 %      Current Facility-Administered Medications on File Prior to Visit  Medication Dose Route Frequency Provider Last Rate Last Dose  . triamcinolone acetonide (KENALOG) 10 MG/ML injection 10 mg  10 mg Other Once Asencion Islam, DPM      . triamcinolone acetonide (KENALOG) 10 MG/ML injection 10 mg  10 mg Other Once [place], Lummie Montijo, DPM      . triamcinolone acetonide (KENALOG-40) injection 20 mg  20 mg Other Once Asencion Islam, DPM        Allergies  Allergen Reactions  . Demerol Anaphylaxis  . Meperidine Anaphylaxis  . Sulfamethoxazole Itching    With redness to skin   . Citalopram Hydrobromide Other (See Comments)    Unknown  . Hydrocodone   . Hydrocodone-Acetaminophen Other (See Comments)    Unknown  . Metoprolol Tartrate  Other (See Comments)    Chest discomfort, palpations  . Oxycodone-Acetaminophen Other (See Comments)    Unknown  . Diltiazem Rash  . Metoprolol Tartrate Palpitations    Objective: There were no vitals filed for this visit.  General: No acute distress, AAOx3  Right foot:Dorsal lateral right foot incision healed, all other surgical sites are well healed with minimal scarring, decreased swelling to right foot, no diffuse erythema, no warmth,  no other signs of infection noted, Capillary fill time <3 seconds in all digits, gross sensation present via light touch to right foot. Mild pain to palpation to right dorsal lateral foot incision site. No pain with calf compression.   Assessment and Plan:  Problem List Items Addressed This Visit    None    Visit Diagnoses    Wound dehiscence    -  Primary   S/P foot surgery, right       Right foot pain       Swelling of ankle, right          -Patient seen and evaluated -Areas healed no dressings needed  -Recommend light duty or sedenitary work x 10 weeks  -Recommend good supportive shoes  -Will plan for reassessment of foot and ankle pain and incision check in 10 weeks at next office. In the meantime, patient to call office if any issues or problems arise.   Asencion Islam, DPM

## 2017-06-04 ENCOUNTER — Ambulatory Visit: Payer: BLUE CROSS/BLUE SHIELD | Admitting: Sports Medicine

## 2017-06-05 ENCOUNTER — Ambulatory Visit: Payer: BLUE CROSS/BLUE SHIELD | Admitting: Sports Medicine

## 2017-07-27 ENCOUNTER — Other Ambulatory Visit: Payer: Self-pay | Admitting: Cardiology

## 2017-08-10 ENCOUNTER — Other Ambulatory Visit: Payer: Self-pay | Admitting: Cardiology

## 2017-08-27 ENCOUNTER — Ambulatory Visit: Payer: Self-pay | Admitting: Cardiology

## 2017-09-08 DIAGNOSIS — R0789 Other chest pain: Secondary | ICD-10-CM | POA: Diagnosis not present

## 2017-09-09 DIAGNOSIS — R0789 Other chest pain: Secondary | ICD-10-CM | POA: Diagnosis not present

## 2017-09-09 DIAGNOSIS — R079 Chest pain, unspecified: Secondary | ICD-10-CM | POA: Diagnosis not present

## 2017-09-25 ENCOUNTER — Ambulatory Visit: Payer: Self-pay | Admitting: Cardiology

## 2017-10-02 ENCOUNTER — Encounter: Payer: Self-pay | Admitting: Cardiology

## 2017-10-02 ENCOUNTER — Ambulatory Visit (INDEPENDENT_AMBULATORY_CARE_PROVIDER_SITE_OTHER): Payer: BLUE CROSS/BLUE SHIELD | Admitting: Cardiology

## 2017-10-02 VITALS — BP 120/60 | HR 80 | Ht 63.0 in | Wt 222.0 lb

## 2017-10-02 DIAGNOSIS — E782 Mixed hyperlipidemia: Secondary | ICD-10-CM

## 2017-10-02 DIAGNOSIS — I10 Essential (primary) hypertension: Secondary | ICD-10-CM

## 2017-10-02 DIAGNOSIS — I420 Dilated cardiomyopathy: Secondary | ICD-10-CM | POA: Diagnosis not present

## 2017-10-02 NOTE — Patient Instructions (Signed)
Medication Instructions:  Your physician recommends that you continue on your current medications as directed. Please refer to the Current Medication list given to you today.   Labwork: Your physician recommends that you return for lab work at [facility] in [place]: Lipid panel. Please fast before having lab work drawn.   Testing/Procedures: None  Follow-Up: Your physician wants you to follow-up in: 6 months. You will receive a reminder letter in the mail two months in advance. If you don't receive a letter, please call our office to schedule the follow-up appointment.  Any Other Special Instructions Will Be Listed Below (If Applicable).     If you need a refill on your cardiac medications before your next appointment, please call your pharmacy.

## 2017-10-02 NOTE — Progress Notes (Signed)
Cardiology Office Note:    Date:  10/02/2017   ID:  [name], DOB 13-Jun-1966, MRN [id number]  PCP:  Dawn Payment., MD  Cardiologist:  Dawn Balsam, MD    Referring MD: Dawn Payment., MD   Chief Complaint  Patient presents with  . Follow-up  Doing well cardiac wise  History of Present Illness:    [name] is a 52 y.o. female with history of cardiomyopathy diagnosed in 2009 but normalization.  Thinking is that she had stress related cardiomyopathy.  Cardiac catheterization at the time was done which was negative.  Recently she ended up going to the hospital.  She woke up in the morning she went to take a shower she started feeling some palpitations her blood pressure 1 skyhigh she ended up going to the emergency room stay in the hospital for 1 day stress test was done which was negative.  Blood pressure had been controlled and she is being released home since that time doing well recently she changed her job and she is very satisfied and happy with new position.  Since have seen her last time she ended up having some foot surgery that was complicated by some wound dehiscence but that seems to be stable on top of that she does have some chronic right hip problem she was told that eventually she required hip surgery.  So far she received one injection to the hip and doing well.  Past Medical History:  Diagnosis Date  . Anxiety   . Hypertension   . Lung mass     Past Surgical History:  Procedure Laterality Date  . ABDOMINAL HYSTERECTOMY    . CHOLECYSTECTOMY    . FOOT SURGERY    . Knee replcement      Current Medications: Current Meds  Medication Sig  . ALPRAZolam (XANAX) 0.5 MG tablet Take 0.5 mg by mouth 3 (three) times daily as needed.    Marland Kitchen amLODipine (NORVASC) 5 MG tablet   . aspirin 81 MG tablet Take 81 mg by mouth daily.    Marland Kitchen docusate sodium (COLACE) 100 MG capsule Take 100 mg by mouth 2 (two) times daily as needed for mild constipation.  Marland Kitchen esomeprazole  (NEXIUM) 20 MG packet Take 20 mg by mouth.  . fluticasone (FLONASE) 50 MCG/ACT nasal spray USE 2 SPRAYS IN EACH NOSTRIL ONCE DAILY  . furosemide (LASIX) 20 MG tablet TAKE 1 TABLET DAILY  . Ibuprofen-Famotidine (DUEXIS) 800-26.6 MG TABS Take 1 (one) Tablet by mouth three times daily  . losartan (COZAAR) 50 MG tablet Take 50 mg by mouth.  . meloxicam (MOBIC) 15 MG tablet Take 15 mg by mouth.  . Multiple Vitamin (MULTIVITAMIN) tablet Take 1 tablet by mouth daily.    . nitroGLYCERIN (NITROSTAT) 0.4 MG SL tablet Place 0.4 mg under the tongue.  Marland Kitchen omeprazole (PRILOSEC) 40 MG capsule Take 40 mg by mouth daily.    Marland Kitchen PROAIR HFA 108 (90 Base) MCG/ACT inhaler   . promethazine (PHENERGAN) 25 MG tablet Take 25 mg by mouth every 8 (eight) hours as needed for nausea or vomiting.  . sertraline (ZOLOFT) 100 MG tablet Take 1 1/2 tablet daily  . simvastatin (ZOCOR) 40 MG tablet Take 40 mg by mouth at bedtime.    . triamcinolone cream (KENALOG) 0.1 % Apply to affected area bid as needed   Current Facility-Administered Medications for the 10/02/17 encounter (Office Visit) with Georgeanna Lea, MD  Medication  . triamcinolone acetonide (KENALOG) 10 MG/ML injection  10 mg  . triamcinolone acetonide (KENALOG) 10 MG/ML injection 10 mg  . triamcinolone acetonide (KENALOG-40) injection 20 mg     Allergies:   Demerol; Meperidine; Sulfamethoxazole; Citalopram hydrobromide; Hydrocodone; Hydrocodone-acetaminophen; Metoprolol tartrate; Oxycodone-acetaminophen; Diltiazem; and Metoprolol tartrate   Social History   Socioeconomic History  . Marital status: Married    Spouse name: Not on file  . Number of children: Not on file  . Years of education: Not on file  . Highest education level: Not on file  Occupational History  . Not on file  Social Needs  . Financial resource strain: Not on file  . Food insecurity:    Worry: Not on file    Inability: Not on file  . Transportation needs:    Medical: Not on file     Non-medical: Not on file  Tobacco Use  . Smoking status: Former Smoker    Types: Cigarettes    Last attempt to quit: 06/16/2006    Years since quitting: 11.3  . Smokeless tobacco: Never Used  Substance and Sexual Activity  . Alcohol use: Yes  . Drug use: No  . Sexual activity: Not on file  Lifestyle  . Physical activity:    Days per week: Not on file    Minutes per session: Not on file  . Stress: Not on file  Relationships  . Social connections:    Talks on phone: Not on file    Gets together: Not on file    Attends religious service: Not on file    Active member of club or organization: Not on file    Attends meetings of clubs or organizations: Not on file    Relationship status: Not on file  Other Topics Concern  . Not on file  Social History Narrative  . Not on file     Family History: The patient's family history includes Dementia in her mother; Heart attack in her father. ROS:   Please see the history of present illness.    All 14 point review of systems negative except as described per history of present illness  EKGs/Labs/Other Studies Reviewed:      Recent Labs: 12/24/2016: Hemoglobin 14.0; Platelets 379  Recent Lipid Panel No results found for: CHOL, TRIG, HDL, CHOLHDL, VLDL, LDLCALC, LDLDIRECT  Physical Exam:    VS:  BP 120/60   Pulse 80   Ht 5\' 3"  (1.6 m)   Wt 222 lb (100.7 kg)   SpO2 96%   BMI 39.33 kg/m     Wt Readings from Last 3 Encounters:  10/02/17 222 lb (100.7 kg)  03/11/11 225 lb (102.1 kg)     GEN:  Well nourished, well developed in no acute distress HEENT: Normal NECK: No JVD; No carotid bruits LYMPHATICS: No lymphadenopathy CARDIAC: RRR, no murmurs, no rubs, no gallops RESPIRATORY:  Clear to auscultation without rales, wheezing or rhonchi  ABDOMEN: Soft, non-tender, non-distended MUSCULOSKELETAL:  No edema; No deformity  SKIN: Warm and dry LOWER EXTREMITIES: no swelling NEUROLOGIC:  Alert and oriented x 3 PSYCHIATRIC:  Normal  affect   ASSESSMENT:    1. Dilated cardiomyopathy (HCC)   2. Essential hypertension   3. Mixed hyperlipidemia    PLAN:    In order of problems listed above:  1. History of dilated cardiomyopathy most likely stress related with normalization I reviewed her stress test from around the hospital which showed normal left ventricular ejection fraction there was no ischemia.  Cardiac catheterization 2009 was normal. 2. Essential hypertension: Blood pressures  well controlled we will continue present management. 3. Dyslipidemia: We will make arrangements to have the lipid profile done in [place].  Overall she seems to be doing well from cardiac point of view.  We talked about potentially wear a event recorder for her palpitations but since this is the first episode ever she had was simply going to watch and see what happens if she will have it again obviously event recorder will be placed.  I see her back in my office in 6 months or sooner if she has a problem   Medication Adjustments/Labs and Tests Ordered: Current medicines are reviewed at length with the patient today.  Concerns regarding medicines are outlined above.  No orders of the defined types were placed in this encounter.  Medication changes: No orders of the defined types were placed in this encounter.   Signed, Georgeanna Lea, MD, [facility] 10/02/2017 9:04 AM    [facility]

## 2017-10-12 ENCOUNTER — Telehealth: Payer: Self-pay | Admitting: *Deleted

## 2017-10-12 NOTE — Telephone Encounter (Signed)
Left message to remind patient that follow up lab work is due to be drawn at [facility] in [place]. Advised her to call the office if she has any questions.

## 2017-10-17 LAB — LIPID PANEL
Chol/HDL Ratio: 2.5 ratio (ref 0.0–4.4)
Cholesterol, Total: 239 mg/dL — ABNORMAL HIGH (ref 100–199)
HDL: 94 mg/dL (ref >39)
LDL Calculated: 126 mg/dL — ABNORMAL HIGH (ref 0–99)
Triglycerides: 95 mg/dL (ref 0–149)
VLDL Cholesterol Cal: 19 mg/dL (ref 5–40)

## 2018-01-13 LAB — PROTIME-INR

## 2018-01-20 ENCOUNTER — Ambulatory Visit (INDEPENDENT_AMBULATORY_CARE_PROVIDER_SITE_OTHER): Payer: BLUE CROSS/BLUE SHIELD | Admitting: Cardiology

## 2018-01-20 ENCOUNTER — Encounter: Payer: Self-pay | Admitting: Cardiology

## 2018-01-20 VITALS — BP 138/84 | HR 84 | Ht 63.0 in | Wt 220.0 lb

## 2018-01-20 DIAGNOSIS — I10 Essential (primary) hypertension: Secondary | ICD-10-CM

## 2018-01-20 DIAGNOSIS — I420 Dilated cardiomyopathy: Secondary | ICD-10-CM | POA: Diagnosis not present

## 2018-01-20 DIAGNOSIS — E785 Hyperlipidemia, unspecified: Secondary | ICD-10-CM

## 2018-01-20 NOTE — Patient Instructions (Signed)
Medication Instructions:  Your physician recommends that you continue on your current medications as directed. Please refer to the Current Medication list given to you today.   Labwork: None  Testing/Procedures: You had an EKG today.   Follow-Up: Your physician wants you to follow-up in: 6 months. You will receive a reminder letter in the mail two months in advance. If you don't receive a letter, please call our office to schedule the follow-up appointment.   If you need a refill on your cardiac medications before your next appointment, please call your pharmacy.   Thank you for choosing [facility]! Catherine Lockhart, RN [phone]    

## 2018-01-20 NOTE — Progress Notes (Signed)
Cardiology Office Note:    Date:  01/20/2018   ID:  [name], DOB 1965/08/03, MRN [id number]  PCP:  Gordan Payment., MD  Cardiologist:  Gypsy Balsam, MD    Referring MD: Gordan Payment., MD   Chief Complaint  Patient presents with  . OFFICE VISIT    clearance for right hip surgery   I need hip surgery  History of Present Illness:    [name] is a 52 y.o. female with history of cardiomyopathy which was nonischemic diagnosed originally in 2009 however normalization of left ventricular ejection fraction.  Latest estimation of ejection fraction was done by a stress test in March of this year which showed normal left ventricular ejection fraction.  Stress test was negative.  She comes to my office because she required right hip replacement surgery and she is here to be evaluated before the surgery.  Denies have any cardiac complaints obviously the biggest problems ability to walk because of pain she cannot do that.  No chest pain tightness squeezing pressure burning chest no shortness of breath.  Last time I seen her there was after hospitalization that was performed by high blood pressure as well as palpitations work-up was negative and she is doing fine.  Denies having any palpitations.  Past Medical History:  Diagnosis Date  . Anxiety   . Hypertension   . Lung mass     Past Surgical History:  Procedure Laterality Date  . ABDOMINAL HYSTERECTOMY    . CHOLECYSTECTOMY    . FOOT SURGERY    . Knee replcement      Current Medications: Current Meds  Medication Sig  . ALPRAZolam (XANAX) 0.5 MG tablet Take 0.5 mg by mouth 3 (three) times daily as needed.    Marland Kitchen amLODipine (NORVASC) 5 MG tablet   . aspirin 81 MG tablet Take 81 mg by mouth daily.    Marland Kitchen esomeprazole (NEXIUM) 20 MG packet Take 20 mg by mouth.  . fluticasone (FLONASE) 50 MCG/ACT nasal spray USE 2 SPRAYS IN EACH NOSTRIL ONCE DAILY  . furosemide (LASIX) 20 MG tablet TAKE 1 TABLET DAILY  . Multiple Vitamin  (MULTIVITAMIN) tablet Take 1 tablet by mouth daily.    . nitroGLYCERIN (NITROSTAT) 0.4 MG SL tablet Place 0.4 mg under the tongue.  Marland Kitchen omeprazole (PRILOSEC) 40 MG capsule Take 40 mg by mouth daily.    Marland Kitchen PROAIR HFA 108 (90 Base) MCG/ACT inhaler   . sertraline (ZOLOFT) 100 MG tablet Take 1 1/2 tablet daily  . simvastatin (ZOCOR) 40 MG tablet Take 40 mg by mouth at bedtime.     Current Facility-Administered Medications for the 01/20/18 encounter (Office Visit) with Georgeanna Lea, MD  Medication  . triamcinolone acetonide (KENALOG) 10 MG/ML injection 10 mg  . triamcinolone acetonide (KENALOG) 10 MG/ML injection 10 mg  . triamcinolone acetonide (KENALOG-40) injection 20 mg     Allergies:   Demerol; Meperidine; Sulfamethoxazole; Citalopram hydrobromide; Hydrocodone; Hydrocodone-acetaminophen; Metoprolol tartrate; Oxycodone-acetaminophen; Diltiazem; and Metoprolol tartrate   Social History   Socioeconomic History  . Marital status: Married    Spouse name: Not on file  . Number of children: Not on file  . Years of education: Not on file  . Highest education level: Not on file  Occupational History  . Not on file  Social Needs  . Financial resource strain: Not on file  . Food insecurity:    Worry: Not on file    Inability: Not on file  . Transportation needs:  Medical: Not on file    Non-medical: Not on file  Tobacco Use  . Smoking status: Former Smoker    Types: Cigarettes    Last attempt to quit: 06/16/2006    Years since quitting: 11.6  . Smokeless tobacco: Never Used  Substance and Sexual Activity  . Alcohol use: Yes  . Drug use: No  . Sexual activity: Not on file  Lifestyle  . Physical activity:    Days per week: Not on file    Minutes per session: Not on file  . Stress: Not on file  Relationships  . Social connections:    Talks on phone: Not on file    Gets together: Not on file    Attends religious service: Not on file    Active member of club or organization:  Not on file    Attends meetings of clubs or organizations: Not on file    Relationship status: Not on file  Other Topics Concern  . Not on file  Social History Narrative  . Not on file     Family History: The patient's family history includes Dementia in her mother; Heart attack in her father. ROS:   Please see the history of present illness.    All 14 point review of systems negative except as described per history of present illness  EKGs/Labs/Other Studies Reviewed:    Normal sinus rhythm normal P interval evidence of left ventricle hypertrophy with left axis deviation nonspecific ST segment changes.  Recent Labs: No results found for requested labs within last 8760 hours.  Recent Lipid Panel    Component Value Date/Time   CHOL 239 (H) 10/16/2017 0812   TRIG 95 10/16/2017 0812   HDL 94 10/16/2017 0812   CHOLHDL 2.5 10/16/2017 0812   LDLCALC 126 (H) 10/16/2017 0812    Physical Exam:    VS:  BP 138/84 (BP Location: Left Arm)   Pulse 84   Ht 5\' 3"  (1.6 m)   Wt 220 lb (99.8 kg)   SpO2 95%   BMI 38.97 kg/m     Wt Readings from Last 3 Encounters:  01/20/18 220 lb (99.8 kg)  10/02/17 222 lb (100.7 kg)  03/11/11 225 lb (102.1 kg)     GEN:  Well nourished, well developed in no acute distress HEENT: Normal NECK: No JVD; No carotid bruits LYMPHATICS: No lymphadenopathy CARDIAC: RRR, no murmurs, no rubs, no gallops RESPIRATORY:  Clear to auscultation without rales, wheezing or rhonchi  ABDOMEN: Soft, non-tender, non-distended MUSCULOSKELETAL:  No edema; No deformity  SKIN: Warm and dry LOWER EXTREMITIES: no swelling NEUROLOGIC:  Alert and oriented x 3 PSYCHIATRIC:  Normal affect   ASSESSMENT:    1. Dilated cardiomyopathy (HCC)   2. Essential hypertension   3. Dyslipidemia    PLAN:    In order of problems listed above:  1. Dilated cardia myopathy last estimation of ejection fraction was normal.  We will continue present management. 2. Essential  hypertension blood pressures controlled we will continue present management 3. Dyslipidemia we will try to call her primary care physician to get fasting lipid profile.  She used to be on Zocor but for some reason this medication has been discontinued. 4. Preop evaluation for hip surgery under general anesthesia.  Overall she is an acceptable candidate for the surgery in view of her latest stress test done in March lack of symptoms we can proceed with surgery as scheduled.  See her back in my office in about 6 months or  sooner if she get a problem   Medication Adjustments/Labs and Tests Ordered: Current medicines are reviewed at length with the patient today.  Concerns regarding medicines are outlined above.  No orders of the defined types were placed in this encounter.  Medication changes: No orders of the defined types were placed in this encounter.   Signed, Georgeanna Lea, MD, [facility] 01/20/2018 10:18 AM    [facility]

## 2018-03-04 DIAGNOSIS — M79676 Pain in unspecified toe(s): Secondary | ICD-10-CM

## 2018-08-18 ENCOUNTER — Encounter: Payer: Self-pay | Admitting: Cardiology

## 2018-08-18 ENCOUNTER — Ambulatory Visit: Payer: BLUE CROSS/BLUE SHIELD | Admitting: Cardiology

## 2018-08-18 VITALS — BP 130/80 | HR 69 | Ht 63.0 in | Wt 224.8 lb

## 2018-08-18 DIAGNOSIS — I420 Dilated cardiomyopathy: Secondary | ICD-10-CM | POA: Diagnosis not present

## 2018-08-18 DIAGNOSIS — I10 Essential (primary) hypertension: Secondary | ICD-10-CM | POA: Diagnosis not present

## 2018-08-18 DIAGNOSIS — E785 Hyperlipidemia, unspecified: Secondary | ICD-10-CM

## 2018-08-18 NOTE — Progress Notes (Signed)
Cardiology Office Note:    Date:  08/18/2018   ID:  [name], DOB [birth year], MRN [id number]  PCP:  Olive Bass, MD  Cardiologist:  Gypsy Balsam, MD    Referring MD: Olive Bass, MD   Chief Complaint  Patient presents with  . Follow-up  Doing well  History of Present Illness:    [name] is a 53 y.o. female who had hip replacement surgery done months ago.  Slowly gradually recovering and is happy the way she feels she build up greenhouse and looking forward to have some plans there.  No chest pain tightness squeezing pressure burning chest she does have some fatigue and tiredness.  Past Medical History:  Diagnosis Date  . Anxiety   . Hypertension   . Lung mass     Past Surgical History:  Procedure Laterality Date  . ABDOMINAL HYSTERECTOMY    . CHOLECYSTECTOMY    . FOOT SURGERY    . Knee replcement      Current Medications: Current Meds  Medication Sig  . amLODipine (NORVASC) 5 MG tablet Take 5 mg by mouth daily.   Marland Kitchen aspirin 81 MG tablet Take 81 mg by mouth daily.    . celecoxib (CELEBREX) 200 MG capsule Take 1 capsule by mouth daily.  Marland Kitchen docusate sodium (COLACE) 100 MG capsule Take 100 mg by mouth 2 (two) times daily as needed for mild constipation.  Marland Kitchen esomeprazole (NEXIUM) 20 MG packet Take 20 mg by mouth.  . fluticasone (FLONASE) 50 MCG/ACT nasal spray USE 2 SPRAYS IN EACH NOSTRIL ONCE DAILY  . furosemide (LASIX) 20 MG tablet TAKE 1 TABLET DAILY  . Multiple Vitamin (MULTIVITAMIN) tablet Take 1 tablet by mouth daily.    . nitroGLYCERIN (NITROSTAT) 0.4 MG SL tablet Place 0.4 mg under the tongue.  Marland Kitchen omeprazole (PRILOSEC) 40 MG capsule Take 40 mg by mouth daily.    . sertraline (ZOLOFT) 100 MG tablet 100 mg daily.    Current Facility-Administered Medications for the 08/18/18 encounter (Office Visit) with Georgeanna Lea, MD  Medication  . triamcinolone acetonide (KENALOG) 10 MG/ML injection 10 mg  . triamcinolone acetonide (KENALOG) 10 MG/ML  injection 10 mg  . triamcinolone acetonide (KENALOG-40) injection 20 mg     Allergies:   Demerol; Meperidine; Sulfamethoxazole; Citalopram hydrobromide; Hydrocodone; Hydrocodone-acetaminophen; Metoprolol tartrate; Oxycodone-acetaminophen; Diltiazem; and Metoprolol tartrate   Social History   Socioeconomic History  . Marital status: Married    Spouse name: Not on file  . Number of children: Not on file  . Years of education: Not on file  . Highest education level: Not on file  Occupational History  . Not on file  Social Needs  . Financial resource strain: Not on file  . Food insecurity:    Worry: Not on file    Inability: Not on file  . Transportation needs:    Medical: Not on file    Non-medical: Not on file  Tobacco Use  . Smoking status: Former Smoker    Types: Cigarettes    Last attempt to quit: 06/16/2006    Years since quitting: 12.1  . Smokeless tobacco: Never Used  Substance and Sexual Activity  . Alcohol use: Yes  . Drug use: No  . Sexual activity: Not on file  Lifestyle  . Physical activity:    Days per week: Not on file    Minutes per session: Not on file  . Stress: Not on file  Relationships  . Social connections:  Talks on phone: Not on file    Gets together: Not on file    Attends religious service: Not on file    Active member of club or organization: Not on file    Attends meetings of clubs or organizations: Not on file    Relationship status: Not on file  Other Topics Concern  . Not on file  Social History Narrative  . Not on file     Family History: The patient's family history includes Dementia in her mother; Heart attack in her father. ROS:   Please see the history of present illness.    All 14 point review of systems negative except as described per history of present illness  EKGs/Labs/Other Studies Reviewed:      Recent Labs: No results found for requested labs within last 8760 hours.  Recent Lipid Panel    Component Value  Date/Time   CHOL 239 (H) 10/16/2017 0812   TRIG 95 10/16/2017 0812   HDL 94 10/16/2017 0812   CHOLHDL 2.5 10/16/2017 0812   LDLCALC 126 (H) 10/16/2017 0812    Physical Exam:    VS:  BP 130/80   Pulse 69   Ht 5\' 3"  (1.6 m)   Wt 224 lb 12.8 oz (102 kg)   SpO2 96%   BMI 39.82 kg/m     Wt Readings from Last 3 Encounters:  08/18/18 224 lb 12.8 oz (102 kg)  01/20/18 220 lb (99.8 kg)  10/02/17 222 lb (100.7 kg)     GEN:  Well nourished, well developed in no acute distress HEENT: Normal NECK: No JVD; No carotid bruits LYMPHATICS: No lymphadenopathy CARDIAC: RRR, no murmurs, no rubs, no gallops RESPIRATORY:  Clear to auscultation without rales, wheezing or rhonchi  ABDOMEN: Soft, non-tender, non-distended MUSCULOSKELETAL:  No edema; No deformity  SKIN: Warm and dry LOWER EXTREMITIES: no swelling NEUROLOGIC:  Alert and oriented x 3 PSYCHIATRIC:  Normal affect   ASSESSMENT:    1. Dilated cardiomyopathy (HCC)   2. Essential hypertension   3. Dyslipidemia    PLAN:    In order of problems listed above:  1. Dilated cardiomyopathy found to repeat her echocardiogram likely clinically she seems compensated and doing well echocardiogram will be scheduled. 2. Essential hypertension blood pressure well controlled continue present management. 3. Dyslipidemia she stopped taking statin however her HDL is very high I will simply recheck her fasting lipid profile.   Medication Adjustments/Labs and Tests Ordered: Current medicines are reviewed at length with the patient today.  Concerns regarding medicines are outlined above.  No orders of the defined types were placed in this encounter.  Medication changes: No orders of the defined types were placed in this encounter.   Signed, Georgeanna Lea, MD, [facility] 08/18/2018 9:12 AM    [facility]

## 2018-08-18 NOTE — Patient Instructions (Signed)
Medication Instructions:  Your physician recommends that you continue on your current medications as directed. Please refer to the Current Medication list given to you today.  If you need a refill on your cardiac medications before your next appointment, please call your pharmacy.   Lab work: Your physician recommends that you have a lipid panel drawn today.  If you have labs (blood work) drawn today and your tests are completely normal, you will receive your results only by: Marland Kitchen MyChart Message (if you have MyChart) OR . A paper copy in the mail If you have any lab test that is abnormal or we need to change your treatment, we will call you to review the results.  Testing/Procedures: Your physician has requested that you have an echocardiogram. Echocardiography is a painless test that uses sound waves to create images of your heart. It provides your doctor with information about the size and shape of your heart and how well your heart's chambers and valves are working. This procedure takes approximately one hour. There are no restrictions for this procedure.    Follow-Up: At [facility], you and your health needs are our priority.  As part of our continuing mission to provide you with exceptional heart care, we have created designated Provider Care Teams.  These Care Teams include your primary Cardiologist (physician) and Advanced Practice Providers (APPs -  Physician Assistants and Nurse Practitioners) who all work together to provide you with the care you need, when you need it. You will need a follow up appointment in 6 months.     Any Other Special Instructions Will Be Listed Below    Echocardiogram An echocardiogram is a procedure that uses painless sound waves (ultrasound) to produce an image of the heart. Images from an echocardiogram can provide important information about:  Signs of coronary artery disease (CAD).  Aneurysm detection. An aneurysm is a weak or damaged part of  an artery wall that bulges out from the normal force of blood pumping through the body.  Heart size and shape. Changes in the size or shape of the heart can be associated with certain conditions, including heart failure, aneurysm, and CAD.  Heart muscle function.  Heart valve function.  Signs of a past heart attack.  Fluid buildup around the heart.  Thickening of the heart muscle.  A tumor or infectious growth around the heart valves. Tell a health care provider about:  Any allergies you have.  All medicines you are taking, including vitamins, herbs, eye drops, creams, and over-the-counter medicines.  Any blood disorders you have.  Any surgeries you have had.  Any medical conditions you have.  Whether you are pregnant or may be pregnant. What are the risks? Generally, this is a safe procedure. However, problems may occur, including:  Allergic reaction to dye (contrast) that may be used during the procedure. What happens before the procedure? No specific preparation is needed. You may eat and drink normally. What happens during the procedure?   An IV tube may be inserted into one of your veins.  You may receive contrast through this tube. A contrast is an injection that improves the quality of the pictures from your heart.  A gel will be applied to your chest.  A wand-like tool (transducer) will be moved over your chest. The gel will help to transmit the sound waves from the transducer.  The sound waves will harmlessly bounce off of your heart to allow the heart images to be captured in real-time motion.  The images will be recorded on a computer. The procedure may vary among health care providers and hospitals. What happens after the procedure?  You may return to your normal, everyday life, including diet, activities, and medicines, unless your health care provider tells you not to do that. Summary  An echocardiogram is a procedure that uses painless sound waves  (ultrasound) to produce an image of the heart.  Images from an echocardiogram can provide important information about the size and shape of your heart, heart muscle function, heart valve function, and fluid buildup around your heart.  You do not need to do anything to prepare before this procedure. You may eat and drink normally.  After the echocardiogram is completed, you may return to your normal, everyday life, unless your health care provider tells you not to do that. This information is not intended to replace advice given to you by your health care provider. Make sure you discuss any questions you have with your health care provider. Document Released: 05/30/2000 Document Revised: 07/05/2016 Document Reviewed: 07/05/2016 Elsevier Interactive Patient Education  2019 [employer].

## 2018-08-19 LAB — LIPID PANEL
Chol/HDL Ratio: 2.6 ratio (ref 0.0–4.4)
Cholesterol, Total: 201 mg/dL — ABNORMAL HIGH (ref 100–199)
HDL: 77 mg/dL (ref >39)
LDL Calculated: 109 mg/dL — ABNORMAL HIGH (ref 0–99)
Triglycerides: 73 mg/dL (ref 0–149)
VLDL Cholesterol Cal: 15 mg/dL (ref 5–40)

## 2018-08-20 ENCOUNTER — Telehealth: Payer: Self-pay

## 2018-08-20 NOTE — Telephone Encounter (Signed)
Left message on patients home phone to call back for results.   

## 2018-09-09 DIAGNOSIS — T63441A Toxic effect of venom of bees, accidental (unintentional), initial encounter: Secondary | ICD-10-CM

## 2018-09-09 HISTORY — DX: Toxic effect of venom of bees, accidental (unintentional), initial encounter: T63.441A

## 2018-09-13 ENCOUNTER — Telehealth: Payer: Self-pay | Admitting: Cardiology

## 2018-09-13 NOTE — Telephone Encounter (Signed)
Echo 04/02

## 2018-09-16 ENCOUNTER — Other Ambulatory Visit: Payer: BLUE CROSS/BLUE SHIELD

## 2018-11-22 DIAGNOSIS — G4709 Other insomnia: Secondary | ICD-10-CM

## 2018-11-22 HISTORY — DX: Other insomnia: G47.09

## 2019-01-11 DIAGNOSIS — S161XXA Strain of muscle, fascia and tendon at neck level, initial encounter: Secondary | ICD-10-CM

## 2019-01-11 HISTORY — DX: Strain of muscle, fascia and tendon at neck level, initial encounter: S16.1XXA

## 2019-02-18 ENCOUNTER — Ambulatory Visit: Payer: BLUE CROSS/BLUE SHIELD | Admitting: Cardiology

## 2019-03-07 ENCOUNTER — Ambulatory Visit: Payer: BLUE CROSS/BLUE SHIELD | Admitting: Cardiology

## 2019-04-14 ENCOUNTER — Telehealth: Payer: BC Managed Care – PPO | Admitting: Cardiology

## 2019-04-14 ENCOUNTER — Other Ambulatory Visit: Payer: Self-pay

## 2019-05-16 DIAGNOSIS — G4719 Other hypersomnia: Secondary | ICD-10-CM

## 2019-05-16 HISTORY — DX: Other hypersomnia: G47.19

## 2019-05-23 ENCOUNTER — Other Ambulatory Visit: Payer: Self-pay

## 2019-05-23 ENCOUNTER — Encounter: Payer: Self-pay | Admitting: Cardiology

## 2019-05-23 ENCOUNTER — Ambulatory Visit (INDEPENDENT_AMBULATORY_CARE_PROVIDER_SITE_OTHER): Payer: BC Managed Care – PPO | Admitting: Cardiology

## 2019-05-23 VITALS — BP 110/62 | HR 86 | Ht 63.0 in | Wt 226.0 lb

## 2019-05-23 DIAGNOSIS — I10 Essential (primary) hypertension: Secondary | ICD-10-CM

## 2019-05-23 DIAGNOSIS — E785 Hyperlipidemia, unspecified: Secondary | ICD-10-CM | POA: Diagnosis not present

## 2019-05-23 DIAGNOSIS — I420 Dilated cardiomyopathy: Secondary | ICD-10-CM

## 2019-05-23 NOTE — Patient Instructions (Signed)
Medication Instructions:  Your physician recommends that you continue on your current medications as directed. Please refer to the Current Medication list given to you today.  *If you need a refill on your cardiac medications before your next appointment, please call your pharmacy*  Lab Work: Your physician recommends that you return for lab work today: bmp, lipids   If you have labs (blood work) drawn today and your tests are completely normal, you will receive your results only by: Marland Kitchen MyChart Message (if you have MyChart) OR . A paper copy in the mail If you have any lab test that is abnormal or we need to change your treatment, we will call you to review the results.  Testing/Procedures: Your physician has requested that you have an echocardiogram. Echocardiography is a painless test that uses sound waves to create images of your heart. It provides your doctor with information about the size and shape of your heart and how well your heart's chambers and valves are working. This procedure takes approximately one hour. There are no restrictions for this procedure.    Follow-Up: At [facility], you and your health needs are our priority.  As part of our continuing mission to provide you with exceptional heart care, we have created designated Provider Care Teams.  These Care Teams include your primary Cardiologist (physician) and Advanced Practice Providers (APPs -  Physician Assistants and Nurse Practitioners) who all work together to provide you with the care you need, when you need it.  Your next appointment:   6 month(s)  The format for your next appointment:   In Person  Provider:   Jenne Campus, MD  Other Instructions   Echocardiogram An echocardiogram is a procedure that uses painless sound waves (ultrasound) to produce an image of the heart. Images from an echocardiogram can provide important information about:  Signs of coronary artery disease (CAD).  Aneurysm  detection. An aneurysm is a weak or damaged part of an artery wall that bulges out from the normal force of blood pumping through the body.  Heart size and shape. Changes in the size or shape of the heart can be associated with certain conditions, including heart failure, aneurysm, and CAD.  Heart muscle function.  Heart valve function.  Signs of a past heart attack.  Fluid buildup around the heart.  Thickening of the heart muscle.  A tumor or infectious growth around the heart valves. Tell a health care provider about:  Any allergies you have.  All medicines you are taking, including vitamins, herbs, eye drops, creams, and over-the-counter medicines.  Any blood disorders you have.  Any surgeries you have had.  Any medical conditions you have.  Whether you are pregnant or may be pregnant. What are the risks? Generally, this is a safe procedure. However, problems may occur, including:  Allergic reaction to dye (contrast) that may be used during the procedure. What happens before the procedure? No specific preparation is needed. You may eat and drink normally. What happens during the procedure?   An IV tube may be inserted into one of your veins.  You may receive contrast through this tube. A contrast is an injection that improves the quality of the pictures from your heart.  A gel will be applied to your chest.  A wand-like tool (transducer) will be moved over your chest. The gel will help to transmit the sound waves from the transducer.  The sound waves will harmlessly bounce off of your heart to allow the heart  images to be captured in real-time motion. The images will be recorded on a computer. The procedure may vary among health care providers and hospitals. What happens after the procedure?  You may return to your normal, everyday life, including diet, activities, and medicines, unless your health care provider tells you not to do that. Summary  An  echocardiogram is a procedure that uses painless sound waves (ultrasound) to produce an image of the heart.  Images from an echocardiogram can provide important information about the size and shape of your heart, heart muscle function, heart valve function, and fluid buildup around your heart.  You do not need to do anything to prepare before this procedure. You may eat and drink normally.  After the echocardiogram is completed, you may return to your normal, everyday life, unless your health care provider tells you not to do that. This information is not intended to replace advice given to you by your health care provider. Make sure you discuss any questions you have with your health care provider. Document Released: 05/30/2000 Document Revised: 09/23/2018 Document Reviewed: 07/05/2016 Elsevier Patient Education  2020 [employer].

## 2019-05-23 NOTE — Progress Notes (Signed)
Cardiology Office Note:    Date:  05/23/2019   ID:  [name], DOB [birth year], MRN [id number]  PCP:  Olive Bass, MD  Cardiologist:  Gypsy Balsam, MD    Referring MD: Olive Bass, MD   Chief Complaint  Patient presents with  . Follow-up  Doing very well  History of Present Illness:    [name] is a 53 y.o. female with history of cardiomyopathy with normalization, also hypertension, dyslipidemia.  Comes today 2 months for follow-up.  Overall doing very well.  Denies have any chest pain, tightness, pressure, burning in the chest.  She does have some fatigue and shortness of breath.  She is actually not working anymore.  Because of some disability.  She also splinted with her boyfriend.  However overall because of all this event she seems to be very happy.  She is thinking about traveling to [place] in her RV.  Past Medical History:  Diagnosis Date  . Anxiety   . Hypertension   . Lung mass     Past Surgical History:  Procedure Laterality Date  . ABDOMINAL HYSTERECTOMY    . CHOLECYSTECTOMY    . FOOT SURGERY    . Knee replcement      Current Medications: Current Meds  Medication Sig  . amLODipine (NORVASC) 5 MG tablet Take 5 mg by mouth daily.   Marland Kitchen aspirin 81 MG tablet Take 81 mg by mouth daily.    . celecoxib (CELEBREX) 200 MG capsule Take 1 capsule by mouth daily.  . cyclobenzaprine (FLEXERIL) 10 MG tablet Take 1 tablet by mouth as needed.  . docusate sodium (COLACE) 100 MG capsule Take 100 mg by mouth 2 (two) times daily as needed for mild constipation.  Marland Kitchen esomeprazole (NEXIUM) 20 MG packet Take 20 mg by mouth.  . fluticasone (FLONASE) 50 MCG/ACT nasal spray USE 2 SPRAYS IN EACH NOSTRIL ONCE DAILY  . furosemide (LASIX) 20 MG tablet TAKE 1 TABLET DAILY  . levocetirizine (XYZAL) 5 MG tablet Take 1 tablet by mouth daily.  . Multiple Vitamin (MULTIVITAMIN) tablet Take 1 tablet by mouth daily.    . nitroGLYCERIN (NITROSTAT) 0.4 MG SL tablet Place  0.4 mg under the tongue.  Marland Kitchen omeprazole (PRILOSEC) 40 MG capsule Take 40 mg by mouth daily.    . sertraline (ZOLOFT) 100 MG tablet 100 mg daily.    Current Facility-Administered Medications for the 05/23/19 encounter (Office Visit) with Georgeanna Lea, MD  Medication  . triamcinolone acetonide (KENALOG) 10 MG/ML injection 10 mg  . triamcinolone acetonide (KENALOG) 10 MG/ML injection 10 mg  . triamcinolone acetonide (KENALOG-40) injection 20 mg     Allergies:   Demerol, Meperidine, Sulfamethoxazole, Citalopram hydrobromide, Hydrocodone, Hydrocodone-acetaminophen, Metoprolol tartrate, Oxycodone-acetaminophen, Diltiazem, and Metoprolol tartrate   Social History   Socioeconomic History  . Marital status: Married    Spouse name: Not on file  . Number of children: Not on file  . Years of education: Not on file  . Highest education level: Not on file  Occupational History  . Not on file  Social Needs  . Financial resource strain: Not on file  . Food insecurity    Worry: Not on file    Inability: Not on file  . Transportation needs    Medical: Not on file    Non-medical: Not on file  Tobacco Use  . Smoking status: Former Smoker    Types: Cigarettes    Quit date: 06/16/2006    Years since  quitting: 12.9  . Smokeless tobacco: Never Used  Substance and Sexual Activity  . Alcohol use: Yes  . Drug use: No  . Sexual activity: Not on file  Lifestyle  . Physical activity    Days per week: Not on file    Minutes per session: Not on file  . Stress: Not on file  Relationships  . Social Herbalist on phone: Not on file    Gets together: Not on file    Attends religious service: Not on file    Active member of club or organization: Not on file    Attends meetings of clubs or organizations: Not on file    Relationship status: Not on file  Other Topics Concern  . Not on file  Social History Narrative  . Not on file     Family History: The patient's family history  includes Dementia in her mother; Heart attack in her father. ROS:   Please see the history of present illness.    All 14 point review of systems negative except as described per history of present illness  EKGs/Labs/Other Studies Reviewed:      Recent Labs: No results found for requested labs within last 8760 hours.  Recent Lipid Panel    Component Value Date/Time   CHOL 201 (H) 08/18/2018 0922   TRIG 73 08/18/2018 0922   HDL 77 08/18/2018 0922   CHOLHDL 2.6 08/18/2018 0922   LDLCALC 109 (H) 08/18/2018 0922    Physical Exam:    VS:  BP 110/62   Pulse 86   Ht 5\' 3"  (1.6 m)   Wt 226 lb (102.5 kg)   SpO2 98%   BMI 40.03 kg/m     Wt Readings from Last 3 Encounters:  05/23/19 226 lb (102.5 kg)  08/18/18 224 lb 12.8 oz (102 kg)  01/20/18 220 lb (99.8 kg)     GEN:  Well nourished, well developed in no acute distress HEENT: Normal NECK: No JVD; No carotid bruits LYMPHATICS: No lymphadenopathy CARDIAC: RRR, no murmurs, no rubs, no gallops RESPIRATORY:  Clear to auscultation without rales, wheezing or rhonchi  ABDOMEN: Soft, non-tender, non-distended MUSCULOSKELETAL:  No edema; No deformity  SKIN: Warm and dry LOWER EXTREMITIES: no swelling NEUROLOGIC:  Alert and oriented x 3 PSYCHIATRIC:  Normal affect   ASSESSMENT:    1. Dilated cardiomyopathy ([place])   2. Essential hypertension   3. Dyslipidemia    PLAN:    In order of problems listed above:  1. Dilated cardiomyopathy echocardiogram will be done to check left ventricle ejection fraction. 2. Essential hypertension blood pressure well controlled continue present management. 3. Dyslipidemia we will check a fasting lipid profile today. 4. I will also check Chem-7 today since she is taking 20 mg furosemide about 3 times a week and she is thinking she may have some low potassium.   Medication Adjustments/Labs and Tests Ordered: Current medicines are reviewed at length with the patient today.  Concerns regarding  medicines are outlined above.  No orders of the defined types were placed in this encounter.  Medication changes: No orders of the defined types were placed in this encounter.   Signed, Park Liter, MD, [facility] 05/23/2019 10:59 AM    [place]

## 2019-05-23 NOTE — Addendum Note (Signed)
Addended by: Ashok Norris on: 05/23/2019 11:18 AM   Modules accepted: Orders

## 2019-05-24 LAB — BASIC METABOLIC PANEL
BUN/Creatinine Ratio: 15 (ref 9–23)
BUN: 9 mg/dL (ref 6–24)
CO2: 25 mmol/L (ref 20–29)
Calcium: 9.5 mg/dL (ref 8.7–10.2)
Chloride: 100 mmol/L (ref 96–106)
Creatinine, Ser: 0.62 mg/dL (ref 0.57–1.00)
GFR calc Af Amer: 119 mL/min/{1.73_m2} (ref >59)
GFR calc non Af Amer: 103 mL/min/{1.73_m2} (ref >59)
Glucose: 89 mg/dL (ref 65–99)
Potassium: 4.1 mmol/L (ref 3.5–5.2)
Sodium: 142 mmol/L (ref 134–144)

## 2019-05-24 LAB — LIPID PANEL
Chol/HDL Ratio: 2.6 ratio (ref 0.0–4.4)
Cholesterol, Total: 276 mg/dL — ABNORMAL HIGH (ref 100–199)
HDL: 108 mg/dL (ref >39)
LDL Chol Calc (NIH): 155 mg/dL — ABNORMAL HIGH (ref 0–99)
Triglycerides: 81 mg/dL (ref 0–149)
VLDL Cholesterol Cal: 13 mg/dL (ref 5–40)

## 2019-07-07 DIAGNOSIS — N760 Acute vaginitis: Secondary | ICD-10-CM | POA: Insufficient documentation

## 2019-07-07 DIAGNOSIS — B9689 Other specified bacterial agents as the cause of diseases classified elsewhere: Secondary | ICD-10-CM

## 2019-07-07 DIAGNOSIS — S39012A Strain of muscle, fascia and tendon of lower back, initial encounter: Secondary | ICD-10-CM | POA: Insufficient documentation

## 2019-07-07 HISTORY — DX: Strain of muscle, fascia and tendon of lower back, initial encounter: S39.012A

## 2019-07-07 HISTORY — DX: Other specified bacterial agents as the cause of diseases classified elsewhere: B96.89

## 2019-07-07 HISTORY — DX: Other specified bacterial agents as the cause of diseases classified elsewhere: N76.0

## 2019-07-18 ENCOUNTER — Other Ambulatory Visit: Payer: Self-pay

## 2019-07-18 ENCOUNTER — Ambulatory Visit (INDEPENDENT_AMBULATORY_CARE_PROVIDER_SITE_OTHER): Payer: BC Managed Care – PPO

## 2019-07-18 DIAGNOSIS — I420 Dilated cardiomyopathy: Secondary | ICD-10-CM | POA: Diagnosis not present

## 2019-07-18 NOTE — Progress Notes (Signed)
Complete echocardiogram has been performed.  Jimmy Srijan Givan RDCS, RVT 

## 2019-12-08 ENCOUNTER — Other Ambulatory Visit: Payer: Self-pay

## 2019-12-14 LAB — HM PAP SMEAR

## 2019-12-20 ENCOUNTER — Other Ambulatory Visit: Payer: Self-pay

## 2019-12-20 ENCOUNTER — Encounter: Payer: Self-pay | Admitting: Cardiology

## 2019-12-20 ENCOUNTER — Ambulatory Visit: Payer: BC Managed Care – PPO | Admitting: Cardiology

## 2019-12-20 VITALS — BP 136/90 | HR 92 | Ht 63.0 in | Wt 225.4 lb

## 2019-12-20 DIAGNOSIS — I420 Dilated cardiomyopathy: Secondary | ICD-10-CM | POA: Diagnosis not present

## 2019-12-20 DIAGNOSIS — I10 Essential (primary) hypertension: Secondary | ICD-10-CM

## 2019-12-20 DIAGNOSIS — K219 Gastro-esophageal reflux disease without esophagitis: Secondary | ICD-10-CM

## 2019-12-20 DIAGNOSIS — E782 Mixed hyperlipidemia: Secondary | ICD-10-CM | POA: Diagnosis not present

## 2019-12-20 NOTE — Patient Instructions (Signed)

## 2019-12-20 NOTE — Progress Notes (Signed)
Cardiology Office Note:    Date:  12/20/2019   ID:  [name], DOB Jun 04, 1966, MRN [id number]  PCP:  Dawn Sofia, PA-C  Cardiologist:  Dawn Balsam, MD    Referring MD: Dawn Bass, MD   No chief complaint on file. I am doing very well  History of Present Illness:    [name] is a 54 y.o. female with past medical history significant for cardiomyopathy with normalization.  Last echocardiogram done in the spring of this year showing normal left ventricle ejection fraction, essential hypertension, dyslipidemia.  Comes today to my office for follow-up.  Overall she is doing well denies have any chest pain tightness squeezing pressure burning chest.  She is planning to have a trip in her RV to [place] and he is she is looking forward to it.  She does not exercise on regular basis she does not stick with any diet.  Past Medical History:  Diagnosis Date  . Anxiety   . Hypertension   . Lung mass     Past Surgical History:  Procedure Laterality Date  . ABDOMINAL HYSTERECTOMY    . CHOLECYSTECTOMY    . FOOT SURGERY    . Knee replcement      Current Medications: Current Meds  Medication Sig  . amLODipine (NORVASC) 5 MG tablet Take 5 mg by mouth daily.   Marland Kitchen aspirin 81 MG tablet Take 81 mg by mouth daily.    . celecoxib (CELEBREX) 200 MG capsule Take 1 capsule by mouth daily.  . cyclobenzaprine (FLEXERIL) 10 MG tablet Take 1 tablet by mouth as needed.  . docusate sodium (COLACE) 100 MG capsule Take 100 mg by mouth 2 (two) times daily as needed for mild constipation.  Marland Kitchen esomeprazole (NEXIUM) 20 MG packet Take 20 mg by mouth.  . fluticasone (FLONASE) 50 MCG/ACT nasal spray USE 2 SPRAYS IN EACH NOSTRIL ONCE DAILY  . furosemide (LASIX) 20 MG tablet TAKE 1 TABLET DAILY  . levocetirizine (XYZAL) 5 MG tablet Take 1 tablet by mouth daily.  . metroNIDAZOLE (FLAGYL) 500 MG tablet Take 500 mg by mouth 2 (two) times daily.  . Multiple Vitamin (MULTIVITAMIN) tablet Take 1 tablet  by mouth daily.    . nitroGLYCERIN (NITROSTAT) 0.4 MG SL tablet Place 0.4 mg under the tongue.  Marland Kitchen omeprazole (PRILOSEC) 40 MG capsule Take 40 mg by mouth daily.    . sertraline (ZOLOFT) 100 MG tablet 100 mg daily.   Marland Kitchen tiZANidine (ZANAFLEX) 4 MG tablet Take 1 tablet by mouth daily.   Current Facility-Administered Medications for the 12/20/19 encounter (Office Visit) with Dawn Lea, MD  Medication  . triamcinolone acetonide (KENALOG) 10 MG/ML injection 10 mg  . triamcinolone acetonide (KENALOG) 10 MG/ML injection 10 mg  . triamcinolone acetonide (KENALOG-40) injection 20 mg     Allergies:   Demerol, Meperidine, Sulfamethoxazole, Citalopram hydrobromide, Hydrocodone, Hydrocodone-acetaminophen, Metoprolol tartrate, Oxycodone-acetaminophen, Diltiazem, and Metoprolol tartrate   Social History   Socioeconomic History  . Marital status: Married    Spouse name: Not on file  . Number of children: Not on file  . Years of education: Not on file  . Highest education level: Not on file  Occupational History  . Not on file  Tobacco Use  . Smoking status: Former Smoker    Types: Cigarettes    Quit date: 06/16/2006    Years since quitting: 13.5  . Smokeless tobacco: Never Used  Substance and Sexual Activity  . Alcohol use: Yes  .  Drug use: No  . Sexual activity: Not on file  Other Topics Concern  . Not on file  Social History Narrative  . Not on file   Social Determinants of Health   Financial Resource Strain:   . Difficulty of Paying Living Expenses:   Food Insecurity:   . Worried About Programme researcher, broadcasting/film/video in the Last Year:   . Barista in the Last Year:   Transportation Needs:   . Freight forwarder (Medical):   Marland Kitchen Lack of Transportation (Non-Medical):   Physical Activity:   . Days of Exercise per Week:   . Minutes of Exercise per Session:   Stress:   . Feeling of Stress :   Social Connections:   . Frequency of Communication with Friends and Family:   .  Frequency of Social Gatherings with Friends and Family:   . Attends Religious Services:   . Active Member of Clubs or Organizations:   . Attends Banker Meetings:   Marland Kitchen Marital Status:      Family History: The patient's family history includes Dementia in her mother; Heart attack in her father. ROS:   Please see the history of present illness.    All 14 point review of systems negative except as described per history of present illness  EKGs/Labs/Other Studies Reviewed:      Recent Labs: 05/23/2019: BUN 9; Creatinine, Ser 0.62; Potassium 4.1; Sodium 142  Recent Lipid Panel    Component Value Date/Time   CHOL 276 (H) 05/23/2019 1123   TRIG 81 05/23/2019 1123   HDL 108 05/23/2019 1123   CHOLHDL 2.6 05/23/2019 1123   LDLCALC 155 (H) 05/23/2019 1123    Physical Exam:    VS:  BP 136/90 (BP Location: Left Arm, Patient Position: Sitting, Cuff Size: Normal)   Pulse 92   Ht 5\' 3"  (1.6 m)   Wt 225 lb 6.4 oz (102.2 kg)   SpO2 98%   BMI 39.93 kg/m     Wt Readings from Last 3 Encounters:  12/20/19 225 lb 6.4 oz (102.2 kg)  05/23/19 226 lb (102.5 kg)  08/18/18 224 lb 12.8 oz (102 kg)     GEN:  Well nourished, well developed in no acute distress HEENT: Normal NECK: No JVD; No carotid bruits LYMPHATICS: No lymphadenopathy CARDIAC: RRR, no murmurs, no rubs, no gallops RESPIRATORY:  Clear to auscultation without rales, wheezing or rhonchi  ABDOMEN: Soft, non-tender, non-distended MUSCULOSKELETAL:  No edema; No deformity  SKIN: Warm and dry LOWER EXTREMITIES: no swelling NEUROLOGIC:  Alert and oriented x 3 PSYCHIATRIC:  Normal affect   ASSESSMENT:    1. Essential hypertension   2. Dilated cardiomyopathy (H[facility])   3. Gastroesophageal reflux disease without esophagitis    PLAN:    In order of problems listed above:  1. Essential hypertension blood pressure appears to be well controlled we will continue present management. 2. History of dilated cardiomyopathy,  echocardiogram reviewed showed preserved left ventricle ejection fraction.  We will continue present management.  Last few echocardiogram showed normal left ventricle ejection fraction. 3. Dyslipidemia: We will check her fasting lipid profile   Medication Adjustments/Labs and Tests Ordered: Current medicines are reviewed at length with the patient today.  Concerns regarding medicines are outlined above.  Orders Placed This Encounter  Procedures  . EKG 12-Lead   Medication changes: No orders of the defined types were placed in this encounter.   Signed, 10/18/18, MD, [facility] 12/20/2019 11:56 AM  [facility]

## 2020-01-17 DIAGNOSIS — U071 COVID-19: Secondary | ICD-10-CM | POA: Insufficient documentation

## 2020-01-17 DIAGNOSIS — J1282 Pneumonia due to coronavirus disease 2019: Secondary | ICD-10-CM

## 2020-01-17 HISTORY — DX: Pneumonia due to coronavirus disease 2019: J12.82

## 2020-01-17 HISTORY — DX: COVID-19: U07.1

## 2020-01-18 DIAGNOSIS — I2699 Other pulmonary embolism without acute cor pulmonale: Secondary | ICD-10-CM | POA: Insufficient documentation

## 2020-01-18 HISTORY — DX: Other pulmonary embolism without acute cor pulmonale: I26.99

## 2020-02-01 ENCOUNTER — Ambulatory Visit (INDEPENDENT_AMBULATORY_CARE_PROVIDER_SITE_OTHER): Payer: BC Managed Care – PPO | Admitting: Physician Assistant

## 2020-02-01 ENCOUNTER — Encounter: Payer: Self-pay | Admitting: Physician Assistant

## 2020-02-01 ENCOUNTER — Other Ambulatory Visit: Payer: Self-pay

## 2020-02-01 VITALS — BP 140/82 | HR 106 | Temp 97.8°F | Ht 63.0 in | Wt 216.0 lb

## 2020-02-01 DIAGNOSIS — K219 Gastro-esophageal reflux disease without esophagitis: Secondary | ICD-10-CM

## 2020-02-01 DIAGNOSIS — E782 Mixed hyperlipidemia: Secondary | ICD-10-CM

## 2020-02-01 DIAGNOSIS — I10 Essential (primary) hypertension: Secondary | ICD-10-CM

## 2020-02-01 DIAGNOSIS — J4520 Mild intermittent asthma, uncomplicated: Secondary | ICD-10-CM

## 2020-02-01 DIAGNOSIS — F419 Anxiety disorder, unspecified: Secondary | ICD-10-CM

## 2020-02-01 DIAGNOSIS — I2699 Other pulmonary embolism without acute cor pulmonale: Secondary | ICD-10-CM

## 2020-02-01 DIAGNOSIS — I428 Other cardiomyopathies: Secondary | ICD-10-CM

## 2020-02-01 NOTE — Progress Notes (Signed)
New Patient Office Visit  Subjective:  Patient ID: [name], female    DOB: November 18, 1965  Age: 54 y.o. MRN: [id number]  CC:  Chief Complaint  Patient presents with  . Hospitalization Follow-up    COVID follow up with blood clots in lungs    HPI [name] presents for history of PE Pt was diagnosed on 7/27 and admitted to hospital until 8/1 with COVID, pneumonia, and history of PE (records not available at time of visit) - pt states she is feeling better now except for 'brain fog' - she states her activity level has improved, has not had fever, cough, etc She was placed on eliquis 5mg  bid  Pt presents for follow up of hypertension. Patient was diagnosed in __2010__. The patient is tolerating the medication well without side effects. Compliance with treatment has been good; including taking medication as directed , maintains a healthy diet and regular exercise regimen , and following up as directed. She also follows with Dr for history of heart attack in 2009 - did not receive stent - last appt with him was before she was diagnosed with COVID Her current meds include amlodopine, lasix prn, and nitrostat as needed  Pt with history of asthma/allergies - currently using xyzal, fluticasone and albuterol - symptoms are stable  Pt has history of GERD - states she uses pepcid and alternates with using prilosec as well - states symptoms are stable  Pt with history of anxiety - has been on zoloft for over 10 years - states symptoms stable at this time    Past Medical History:  Diagnosis Date  . Anxiety   . Hypertension   . Lung mass     Past Surgical History:  Procedure Laterality Date  . ABDOMINAL HYSTERECTOMY    . CHOLECYSTECTOMY    . FOOT SURGERY    . Knee replcement      Family History  Problem Relation Age of Onset  . Dementia Mother   . Heart attack Father     Social History   Socioeconomic History  . Marital status: Married    Spouse name: Not on  file  . Number of children: Not on file  . Years of education: Not on file  . Highest education level: Not on file  Occupational History  . Not on file  Tobacco Use  . Smoking status: Former Smoker    Types: Cigarettes    Quit date: 06/16/2006    Years since quitting: 13.6  . Smokeless tobacco: Never Used  Substance and Sexual Activity  . Alcohol use: Yes  . Drug use: No  . Sexual activity: Not on file  Other Topics Concern  . Not on file  Social History Narrative  . Not on file   Social Determinants of Health   Financial Resource Strain:   . Difficulty of Paying Living Expenses:   Food Insecurity:   . Worried About 08/15/2006 in the Last Year:   . Programme researcher, broadcasting/film/video in the Last Year:   Transportation Needs:   . Barista (Medical):   Freight forwarder Lack of Transportation (Non-Medical):   Physical Activity:   . Days of Exercise per Week:   . Minutes of Exercise per Session:   Stress:   . Feeling of Stress :   Social Connections:   . Frequency of Communication with Friends and Family:   . Frequency of Social Gatherings with Friends and Family:   . Attends  Religious Services:   . Active Member of Clubs or Organizations:   . Attends Banker Meetings:   Marland Kitchen Marital Status:   Intimate Partner Violence:   . Fear of Current or Ex-Partner:   . Emotionally Abused:   Marland Kitchen Physically Abused:   . Sexually Abused:      Current Outpatient Medications:  .  albuterol (VENTOLIN HFA) 108 (90 Base) MCG/ACT inhaler, Inhale 1-2 puffs into the lungs every 6 (six) hours as needed for wheezing or shortness of breath., Disp: , Rfl:  .  amLODipine (NORVASC) 5 MG tablet, Take 5 mg by mouth daily. , Disp: , Rfl:  .  ELIQUIS 5 MG TABS tablet, Take 5 mg by mouth 2 (two) times daily. , Disp: , Rfl:  .  famotidine (PEPCID) 20 MG tablet, Take 20 mg by mouth daily., Disp: , Rfl:  .  fluticasone (FLONASE) 50 MCG/ACT nasal spray, USE 2 SPRAYS IN EACH NOSTRIL ONCE DAILY, Disp: , Rfl:    .  furosemide (LASIX) 20 MG tablet, TAKE 1 TABLET DAILY, Disp: 90 tablet, Rfl: 0 .  levocetirizine (XYZAL) 5 MG tablet, Take 1 tablet by mouth daily., Disp: , Rfl:  .  Multiple Vitamin (MULTIVITAMIN) tablet, Take 1 tablet by mouth daily.  , Disp: , Rfl:  .  nitroGLYCERIN (NITROSTAT) 0.4 MG SL tablet, Place 0.4 mg under the tongue., Disp: , Rfl:  .  omeprazole (PRILOSEC) 40 MG capsule, Take 40 mg by mouth daily.  , Disp: , Rfl:  .  ondansetron (ZOFRAN-ODT) 4 MG disintegrating tablet, Take by mouth., Disp: , Rfl:  .  sertraline (ZOLOFT) 100 MG tablet, 100 mg daily. , Disp: , Rfl:  .  traMADol (ULTRAM) 50 MG tablet, Take 50 mg by mouth 3 (three) times daily as needed., Disp: , Rfl:    Allergies  Allergen Reactions  . Demerol Anaphylaxis  . Meperidine Anaphylaxis  . Sulfamethoxazole Itching    With redness to skin   . Citalopram Hydrobromide Other (See Comments)    Unknown  . Hydrocodone   . Hydrocodone-Acetaminophen Other (See Comments)    Unknown  . Metoprolol Tartrate Other (See Comments)    Chest discomfort, palpations  . Oxycodone-Acetaminophen Other (See Comments)    Unknown  . Diltiazem Rash  . Metoprolol Tartrate Palpitations    ROS CONSTITUTIONAL: Negative for chills, fatigue, fever, unintentional weight gain and unintentional weight loss.  E/N/T: Negative for ear pain, nasal congestion and sore throat.  CARDIOVASCULAR: Negative for chest pain, dizziness, palpitations and pedal edema.  RESPIRATORY: Negative for recent cough and dyspnea.  GASTROINTESTINAL: Negative for abdominal pain, acid reflux symptoms, constipation, diarrhea, nausea and vomiting.  MSK: Negative for arthralgias and myalgias.  INTEGUMENTARY: Negative for rash.  NEUROLOGICAL: Negative for dizziness and headaches.  PSYCHIATRIC: Negative for sleep disturbance and to question depression screen.  Negative for depression, negative for anhedonia.        Objective:    PHYSICAL EXAM:   VS: BP 140/82 (BP  Location: Left Arm, Patient Position: Sitting)   Pulse (!) 106   Temp 97.8 F (36.6 C) (Temporal)   Ht 5\' 3"  (1.6 m)   Wt 216 lb (98 kg)   SpO2 96%   BMI 38.26 kg/m   GEN: Well nourished, well developed, in no acute distress  HEENT: normal external ears and nose - normal external auditory canals and TMS - hearing grossly normal - normal nasal mucosa and septum - Lips, Teeth and Gums - normal  Oropharynx - normal  mucosa, palate, and posterior pharynx Neck: no JVD or masses - no thyromegaly Cardiac: RRR; no murmurs, rubs, or gallops,no edema - no significant varicosities Respiratory:  normal respiratory rate and pattern with no distress - normal breath sounds with no rales, rhonchi, wheezes or rubs GI: normal bowel sounds, no masses or tenderness MS: no deformity or atrophy  Skin: warm and dry, no rash  Neuro:  Alert and Oriented x 3, Strength and sensation are intact - CN II-Xii grossly intact Psych: euthymic mood, appropriate affect and demeanor  BP 140/82 (BP Location: Left Arm, Patient Position: Sitting)   Pulse (!) 106   Temp 97.8 F (36.6 C) (Temporal)   Ht 5\' 3"  (1.6 m)   Wt 216 lb (98 kg)   SpO2 96%   BMI 38.26 kg/m  Wt Readings from Last 3 Encounters:  02/01/20 216 lb (98 kg)  12/20/19 225 lb 6.4 oz (102.2 kg)  05/23/19 226 lb (102.5 kg)     Health Maintenance Due  Topic Date Due  . Hepatitis C Screening  Never done  . COVID-19 Vaccine (1) Never done  . HIV Screening  Never done  . TETANUS/TDAP  Never done  . PAP SMEAR-Modifier  Never done  . MAMMOGRAM  Never done  . COLONOSCOPY  Never done  . INFLUENZA VACCINE  01/15/2020    There are no preventive care reminders to display for this patient.  No results found for: TSH Lab Results  Component Value Date   WBC 5.7 12/24/2016   HGB 14.0 12/24/2016   HCT 41.1 12/24/2016   MCV 98.6 12/24/2016   PLT 379 12/24/2016   Lab Results  Component Value Date   NA 142 05/23/2019   K 4.1 05/23/2019   CO2 25  05/23/2019   GLUCOSE 89 05/23/2019   BUN 9 05/23/2019   CREATININE 0.62 05/23/2019   CALCIUM 9.5 05/23/2019   Lab Results  Component Value Date   CHOL 276 (H) 05/23/2019   Lab Results  Component Value Date   HDL 108 05/23/2019   Lab Results  Component Value Date   LDLCALC 155 (H) 05/23/2019   Lab Results  Component Value Date   TRIG 81 05/23/2019   Lab Results  Component Value Date   CHOLHDL 2.6 05/23/2019   No results found for: HGBA1C    Assessment & Plan:   Problem List Items Addressed This Visit      Cardiovascular and Mediastinum   Essential hypertension   Relevant Medications   ELIQUIS 5 MG TABS tablet   Other Relevant Orders   CBC with Differential/Platelet   Comprehensive metabolic panel   TSH   Lipid panel   Cardiomyopathy (HCC)   Relevant Medications   ELIQUIS 5 MG TABS tablet   Pulmonary embolus (HCC) - Primary   Relevant Medications   ELIQUIS 5 MG TABS tablet     Respiratory   Asthma   Relevant Medications   albuterol (VENTOLIN HFA) 108 (90 Base) MCG/ACT inhaler     Digestive   Gastroesophageal reflux disease without esophagitis   Relevant Medications   ondansetron (ZOFRAN-ODT) 4 MG disintegrating tablet   famotidine (PEPCID) 20 MG tablet     Other   Anxiety   Hyperlipidemia   Relevant Medications   ELIQUIS 5 MG TABS tablet   Other Relevant Orders   Lipid panel      No orders of the defined types were placed in this encounter.   Follow-up: Return in about 3 months (around 05/03/2020) for  chronic fasting follow up.    SARA R Roshawna Colclasure, PA-C

## 2020-02-02 LAB — CBC WITH DIFFERENTIAL/PLATELET
Basophils Absolute: 0.1 10*3/uL (ref 0.0–0.2)
Basos: 1 %
EOS (ABSOLUTE): 0.4 10*3/uL (ref 0.0–0.4)
Eos: 3 %
Hematocrit: 38.3 % (ref 34.0–46.6)
Hemoglobin: 13.3 g/dL (ref 11.1–15.9)
Immature Grans (Abs): 0.1 10*3/uL (ref 0.0–0.1)
Immature Granulocytes: 1 %
Lymphocytes Absolute: 2 10*3/uL (ref 0.7–3.1)
Lymphs: 15 %
MCH: 33.3 pg — ABNORMAL HIGH (ref 26.6–33.0)
MCHC: 34.7 g/dL (ref 31.5–35.7)
MCV: 96 fL (ref 79–97)
Monocytes Absolute: 0.8 10*3/uL (ref 0.1–0.9)
Monocytes: 6 %
Neutrophils Absolute: 10.2 10*3/uL — ABNORMAL HIGH (ref 1.4–7.0)
Neutrophils: 74 %
Platelets: 416 10*3/uL (ref 150–450)
RBC: 4 x10E6/uL (ref 3.77–5.28)
RDW: 12.9 % (ref 11.7–15.4)
WBC: 13.6 10*3/uL — ABNORMAL HIGH (ref 3.4–10.8)

## 2020-02-02 LAB — COMPREHENSIVE METABOLIC PANEL
ALT: 43 IU/L — ABNORMAL HIGH (ref 0–32)
AST: 31 IU/L (ref 0–40)
Albumin/Globulin Ratio: 1.3 (ref 1.2–2.2)
Albumin: 4 g/dL (ref 3.8–4.9)
Alkaline Phosphatase: 121 IU/L (ref 48–121)
BUN/Creatinine Ratio: 22 (ref 9–23)
BUN: 16 mg/dL (ref 6–24)
Bilirubin Total: 0.3 mg/dL (ref 0.0–1.2)
CO2: 29 mmol/L (ref 20–29)
Calcium: 9.8 mg/dL (ref 8.7–10.2)
Chloride: 97 mmol/L (ref 96–106)
Creatinine, Ser: 0.74 mg/dL (ref 0.57–1.00)
GFR calc Af Amer: 106 mL/min/{1.73_m2} (ref >59)
GFR calc non Af Amer: 92 mL/min/{1.73_m2} (ref >59)
Globulin, Total: 3 g/dL (ref 1.5–4.5)
Glucose: 101 mg/dL — ABNORMAL HIGH (ref 65–99)
Potassium: 4.5 mmol/L (ref 3.5–5.2)
Sodium: 140 mmol/L (ref 134–144)
Total Protein: 7 g/dL (ref 6.0–8.5)

## 2020-02-02 LAB — TSH: TSH: 3.64 u[IU]/mL (ref 0.450–4.500)

## 2020-02-02 LAB — LIPID PANEL
Chol/HDL Ratio: 3.6 ratio (ref 0.0–4.4)
Cholesterol, Total: 314 mg/dL — ABNORMAL HIGH (ref 100–199)
HDL: 87 mg/dL (ref >39)
LDL Chol Calc (NIH): 201 mg/dL — ABNORMAL HIGH (ref 0–99)
Triglycerides: 146 mg/dL (ref 0–149)
VLDL Cholesterol Cal: 26 mg/dL (ref 5–40)

## 2020-02-02 LAB — CARDIOVASCULAR RISK ASSESSMENT

## 2020-02-27 ENCOUNTER — Other Ambulatory Visit: Payer: Self-pay

## 2020-02-27 MED ORDER — AMLODIPINE BESYLATE 5 MG PO TABS: 5.0000 mg | ORAL_TABLET | Freq: Every day | ORAL | 0 refills | Status: DC

## 2020-02-27 MED ORDER — SERTRALINE HCL 100 MG PO TABS: 100.0000 mg | ORAL_TABLET | Freq: Every day | ORAL | 0 refills | Status: DC

## 2020-03-07 ENCOUNTER — Ambulatory Visit (INDEPENDENT_AMBULATORY_CARE_PROVIDER_SITE_OTHER): Payer: BC Managed Care – PPO | Admitting: Cardiology

## 2020-03-07 ENCOUNTER — Encounter: Payer: Self-pay | Admitting: Cardiology

## 2020-03-07 ENCOUNTER — Other Ambulatory Visit: Payer: Self-pay

## 2020-03-07 VITALS — BP 132/108 | HR 84 | Ht 63.0 in | Wt 223.0 lb

## 2020-03-07 DIAGNOSIS — I428 Other cardiomyopathies: Secondary | ICD-10-CM | POA: Diagnosis not present

## 2020-03-07 DIAGNOSIS — I2782 Chronic pulmonary embolism: Secondary | ICD-10-CM

## 2020-03-07 DIAGNOSIS — E782 Mixed hyperlipidemia: Secondary | ICD-10-CM

## 2020-03-07 DIAGNOSIS — I10 Essential (primary) hypertension: Secondary | ICD-10-CM | POA: Diagnosis not present

## 2020-03-07 LAB — LIPID PANEL
Chol/HDL Ratio: 3 ratio (ref 0.0–4.4)
Cholesterol, Total: 240 mg/dL — ABNORMAL HIGH (ref 100–199)
HDL: 79 mg/dL (ref >39)
LDL Chol Calc (NIH): 142 mg/dL — ABNORMAL HIGH (ref 0–99)
Triglycerides: 112 mg/dL (ref 0–149)
VLDL Cholesterol Cal: 19 mg/dL (ref 5–40)

## 2020-03-07 NOTE — Patient Instructions (Signed)
Medication Instructions:  Your physician recommends that you continue on your current medications as directed. Please refer to the Current Medication list given to you today.  *If you need a refill on your cardiac medications before your next appointment, please call your pharmacy*   Lab Work: Your physician recommends that you return for lab work today: lipid   If you have labs (blood work) drawn today and your tests are completely normal, you will receive your results only by: Marland Kitchen MyChart Message (if you have MyChart) OR . A paper copy in the mail If you have any lab test that is abnormal or we need to change your treatment, we will call you to review the results.   Testing/Procedures: Your physician has requested that you have an echocardiogram. Echocardiography is a painless test that uses sound waves to create images of your heart. It provides your doctor with information about the size and shape of your heart and how well your heart's chambers and valves are working. This procedure takes approximately one hour. There are no restrictions for this procedure.     Follow-Up: At [facility], you and your health needs are our priority.  As part of our continuing mission to provide you with exceptional heart care, we have created designated Provider Care Teams.  These Care Teams include your primary Cardiologist (physician) and Advanced Practice Providers (APPs -  Physician Assistants and Nurse Practitioners) who all work together to provide you with the care you need, when you need it.  We recommend signing up for the patient portal called "MyChart".  Sign up information is provided on this After Visit Summary.  MyChart is used to connect with patients for Virtual Visits (Telemedicine).  Patients are able to view lab/test results, encounter notes, upcoming appointments, etc.  Non-urgent messages can be sent to your provider as well.   To learn more about what you can do with MyChart, go to  [email or web address].    Your next appointment:   3 month(s)  The format for your next appointment:   In Person  Provider:   Gypsy Balsam, MD   Other Instructions   Echocardiogram An echocardiogram is a procedure that uses painless sound waves (ultrasound) to produce an image of the heart. Images from an echocardiogram can provide important information about:  Signs of coronary artery disease (CAD).  Aneurysm detection. An aneurysm is a weak or damaged part of an artery wall that bulges out from the normal force of blood pumping through the body.  Heart size and shape. Changes in the size or shape of the heart can be associated with certain conditions, including heart failure, aneurysm, and CAD.  Heart muscle function.  Heart valve function.  Signs of a past heart attack.  Fluid buildup around the heart.  Thickening of the heart muscle.  A tumor or infectious growth around the heart valves. Tell a health care provider about:  Any allergies you have.  All medicines you are taking, including vitamins, herbs, eye drops, creams, and over-the-counter medicines.  Any blood disorders you have.  Any surgeries you have had.  Any medical conditions you have.  Whether you are pregnant or may be pregnant. What are the risks? Generally, this is a safe procedure. However, problems may occur, including:  Allergic reaction to dye (contrast) that may be used during the procedure. What happens before the procedure? No specific preparation is needed. You may eat and drink normally. What happens during the procedure?   An IV tube  may be inserted into one of your veins.  You may receive contrast through this tube. A contrast is an injection that improves the quality of the pictures from your heart.  A gel will be applied to your chest.  A wand-like tool (transducer) will be moved over your chest. The gel will help to transmit the sound waves from the  transducer.  The sound waves will harmlessly bounce off of your heart to allow the heart images to be captured in real-time motion. The images will be recorded on a computer. The procedure may vary among health care providers and hospitals. What happens after the procedure?  You may return to your normal, everyday life, including diet, activities, and medicines, unless your health care provider tells you not to do that. Summary  An echocardiogram is a procedure that uses painless sound waves (ultrasound) to produce an image of the heart.  Images from an echocardiogram can provide important information about the size and shape of your heart, heart muscle function, heart valve function, and fluid buildup around your heart.  You do not need to do anything to prepare before this procedure. You may eat and drink normally.  After the echocardiogram is completed, you may return to your normal, everyday life, unless your health care provider tells you not to do that. This information is not intended to replace advice given to you by your health care provider. Make sure you discuss any questions you have with your health care provider. Document Revised: 09/23/2018 Document Reviewed: 07/05/2016 Elsevier Patient Education  2020 Elsevier Inc.   

## 2020-03-07 NOTE — Progress Notes (Signed)
Cardiology Office Note:    Date:  03/07/2020   ID:  [name], DOB 01-03-1966, MRN [id number]  PCP:  Dawn Sofia, PA-C  Cardiologist:  Dawn Balsam, MD    Referring MD: Dawn Sofia, PA-C   Chief Complaint  Patient presents with  . Follow-up  Had COVID-19  History of Present Illness:    [name] is a 54 y.o. female I will be following her for cardiomyopathy, also long time ago she did have small myocardial infarction without obstruction within her coronary arteries, her left ventricle ejection fraction overall improved.  She been doing quite well recently she became sick with COVID-19.  She ended going to the hospital, she was also find to have pulmonary emboli treated with Eliquis.  She is coming back to my office gradually improving but still she said about 50% the way she was before.  Described to have some exertional shortness of breath but no chest pain tightness squeezing pressure burning chest.  Past Medical History:  Diagnosis Date  . Anxiety   . Asthma   . Depression   . Gastro-esophageal reflux disease without esophagitis   . History of blood clots   . Hypertension   . Lung mass     Past Surgical History:  Procedure Laterality Date  . ABDOMINAL HYSTERECTOMY    . CHOLECYSTECTOMY    . FOOT SURGERY    . Knee replcement    . TONSILECTOMY/ADENOIDECTOMY WITH MYRINGOTOMY  1972    Current Medications: Current Meds  Medication Sig  . albuterol (VENTOLIN HFA) 108 (90 Base) MCG/ACT inhaler Inhale 1-2 puffs into the lungs every 6 (six) hours as needed for wheezing or shortness of breath.  Marland Kitchen amLODipine (NORVASC) 5 MG tablet Take 1 tablet (5 mg total) by mouth daily.  Marland Kitchen ELIQUIS 5 MG TABS tablet Take 5 mg by mouth 2 (two) times daily.   . famotidine (PEPCID) 20 MG tablet Take 20 mg by mouth daily.  . fluticasone (FLONASE) 50 MCG/ACT nasal spray USE 2 SPRAYS IN EACH NOSTRIL ONCE DAILY  . furosemide (LASIX) 20 MG tablet TAKE 1 TABLET DAILY  . levocetirizine  (XYZAL) 5 MG tablet Take 1 tablet by mouth daily.  . Multiple Vitamin (MULTIVITAMIN) tablet Take 1 tablet by mouth daily.    . nitroGLYCERIN (NITROSTAT) 0.4 MG SL tablet Place 0.4 mg under the tongue.  Marland Kitchen omeprazole (PRILOSEC) 40 MG capsule Take 40 mg by mouth daily.    . ondansetron (ZOFRAN-ODT) 4 MG disintegrating tablet Take by mouth.  . sertraline (ZOLOFT) 100 MG tablet Take 1 tablet (100 mg total) by mouth daily.  . traMADol (ULTRAM) 50 MG tablet Take 50 mg by mouth 3 (three) times daily as needed.     Allergies:   Demerol, Meperidine, Sulfamethoxazole, Citalopram hydrobromide, Hydrocodone, Hydrocodone-acetaminophen, Metoprolol tartrate, Oxycodone-acetaminophen, Diltiazem, and Metoprolol tartrate   Social History   Socioeconomic History  . Marital status: Married    Spouse name: Not on file  . Number of children: 1  . Years of education: Not on file  . Highest education level: Not on file  Occupational History  . Not on file  Tobacco Use  . Smoking status: Former Smoker    Types: Cigarettes    Quit date: 06/16/2006    Years since quitting: 13.7  . Smokeless tobacco: Never Used  Vaping Use  . Vaping Use: Never used  Substance and Sexual Activity  . Alcohol use: Yes  . Drug use: No  . Sexual activity: Not on  file  Other Topics Concern  . Not on file  Social History Narrative  . Not on file   Social Determinants of Health   Financial Resource Strain:   . Difficulty of Paying Living Expenses: Not on file  Food Insecurity:   . Worried About Programme researcher, broadcasting/film/video in the Last Year: Not on file  . Ran Out of Food in the Last Year: Not on file  Transportation Needs:   . Lack of Transportation (Medical): Not on file  . Lack of Transportation (Non-Medical): Not on file  Physical Activity:   . Days of Exercise per Week: Not on file  . Minutes of Exercise per Session: Not on file  Stress:   . Feeling of Stress : Not on file  Social Connections:   . Frequency of Communication  with Friends and Family: Not on file  . Frequency of Social Gatherings with Friends and Family: Not on file  . Attends Religious Services: Not on file  . Active Member of Clubs or Organizations: Not on file  . Attends Banker Meetings: Not on file  . Marital Status: Not on file     Family History: The patient's family history includes Dementia in her mother; Heart attack in her father. ROS:   Please see the history of present illness.    All 14 point review of systems negative except as described per history of present illness  EKGs/Labs/Other Studies Reviewed:      Recent Labs: 02/01/2020: ALT 43; BUN 16; Creatinine, Ser 0.74; Hemoglobin 13.3; Platelets 416; Potassium 4.5; Sodium 140; TSH 3.640  Recent Lipid Panel    Component Value Date/Time   CHOL 314 (H) 02/01/2020 1043   TRIG 146 02/01/2020 1043   HDL 87 02/01/2020 1043   CHOLHDL 3.6 02/01/2020 1043   LDLCALC 201 (H) 02/01/2020 1043    Physical Exam:    VS:  BP (!) 132/108 (BP Location: Left Arm, Patient Position: Sitting, Cuff Size: Normal)   Pulse 84   Ht 5\' 3"  (1.6 m)   Wt 223 lb (101.2 kg)   SpO2 97%   BMI 39.50 kg/m     Wt Readings from Last 3 Encounters:  03/07/20 223 lb (101.2 kg)  02/01/20 216 lb (98 kg)  12/20/19 225 lb 6.4 oz (102.2 kg)     GEN:  Well nourished, well developed in no acute distress HEENT: Normal NECK: No JVD; No carotid bruits LYMPHATICS: No lymphadenopathy CARDIAC: RRR, no murmurs, no rubs, no gallops RESPIRATORY:  Clear to auscultation without rales, wheezing or rhonchi  ABDOMEN: Soft, non-tender, non-distended MUSCULOSKELETAL:  No edema; No deformity  SKIN: Warm and dry LOWER EXTREMITIES: no swelling NEUROLOGIC:  Alert and oriented x 3 PSYCHIATRIC:  Normal affect   ASSESSMENT:    1. Other cardiomyopathy (HCC)   2. Mixed hyperlipidemia   3. Other chronic pulmonary embolism without acute cor pulmonale (HCC)   4. Essential hypertension    PLAN:    In order  of problems listed above:  1. Cardiomyopathy history of.  I will schedule her to have echocardiogram in December.  Last echocardiogram done showed preserved left ventricle ejection fraction and that was in February.  Another reason for echocardiogram is to review and recheck pulmonary pressure. 2. Dyslipidemia: I did check her K PN which showed me her LDL of 201, this is the highest ever.  I talked to her about cholesterol-lowering medication she wants to recheck her cholesterol which I will do and if it is  elevated obviously she need to be treated aggressively. 3. History of pulmonary emboli in view of COVID-19 infection.  She is anticoagulant Eliquis.  Will continue she probably required at least 3 months of this therapy.  I would consider this pulmonary emboli as provoked. 4. Essential hypertension blood pressure slightly elevated today, but she said that she check her blood pressure at home is usually good. 5. Overall he is recovering after COVID-19 infection, her infection was complicated by pulmonary emboli.  On Eliquis which I will continue.  I did review her record from the hospital for this visit.   Medication Adjustments/Labs and Tests Ordered: Current medicines are reviewed at length with the patient today.  Concerns regarding medicines are outlined above.  Orders Placed This Encounter  Procedures  . Lipid panel  . ECHOCARDIOGRAM COMPLETE   Medication changes: No orders of the defined types were placed in this encounter.   Signed, Georgeanna Lea, MD, [facility] 03/07/2020 8:51 AM    [facility]

## 2020-03-08 ENCOUNTER — Telehealth: Payer: Self-pay | Admitting: Emergency Medicine

## 2020-03-08 DIAGNOSIS — E785 Hyperlipidemia, unspecified: Secondary | ICD-10-CM

## 2020-03-08 MED ORDER — ATORVASTATIN CALCIUM 10 MG PO TABS: 10.0000 mg | ORAL_TABLET | Freq: Every day | ORAL | 1 refills | Status: DC

## 2020-03-08 NOTE — Telephone Encounter (Signed)
Called patient informed her of results. Advised her to start Lipitor 10 mg daily and have labs redrawn in 6 weeks. She verbally understood. No further questions.

## 2020-03-08 NOTE — Telephone Encounter (Signed)
-----   Message from Georgeanna Lea, MD sent at 03/08/2020 11:13 AM EDT ----- Cholesterol dramatically better.  LDL month ago was 201 now +16.  Still advisable to consider statin.  If she is willing to take something Lipitor 10 mg will be a good way to start and if she is willing to do that 6 weeks later she have to have

## 2020-04-03 ENCOUNTER — Other Ambulatory Visit: Payer: Self-pay

## 2020-04-03 ENCOUNTER — Encounter: Payer: Self-pay | Admitting: Family Medicine

## 2020-04-03 ENCOUNTER — Ambulatory Visit (INDEPENDENT_AMBULATORY_CARE_PROVIDER_SITE_OTHER): Payer: BC Managed Care – PPO | Admitting: Family Medicine

## 2020-04-03 VITALS — BP 122/78 | HR 92 | Temp 97.9°F | Wt 227.8 lb

## 2020-04-03 DIAGNOSIS — R0609 Other forms of dyspnea: Secondary | ICD-10-CM

## 2020-04-03 DIAGNOSIS — U099 Post covid-19 condition, unspecified: Secondary | ICD-10-CM

## 2020-04-03 DIAGNOSIS — Z23 Encounter for immunization: Secondary | ICD-10-CM

## 2020-04-03 DIAGNOSIS — R0789 Other chest pain: Secondary | ICD-10-CM | POA: Diagnosis not present

## 2020-04-03 DIAGNOSIS — I2699 Other pulmonary embolism without acute cor pulmonale: Secondary | ICD-10-CM

## 2020-04-03 DIAGNOSIS — R0600 Dyspnea, unspecified: Secondary | ICD-10-CM | POA: Diagnosis not present

## 2020-04-03 DIAGNOSIS — I110 Hypertensive heart disease with heart failure: Secondary | ICD-10-CM

## 2020-04-03 DIAGNOSIS — F411 Generalized anxiety disorder: Secondary | ICD-10-CM

## 2020-04-03 DIAGNOSIS — F410 Panic disorder [episodic paroxysmal anxiety] without agoraphobia: Secondary | ICD-10-CM

## 2020-04-03 LAB — COMPREHENSIVE METABOLIC PANEL
ALT: 18 IU/L (ref 0–32)
AST: 19 IU/L (ref 0–40)
Albumin/Globulin Ratio: 2.2 (ref 1.2–2.2)
Albumin: 4.6 g/dL (ref 3.8–4.9)
Alkaline Phosphatase: 126 IU/L — ABNORMAL HIGH (ref 44–121)
BUN/Creatinine Ratio: 14 (ref 9–23)
BUN: 9 mg/dL (ref 6–24)
Bilirubin Total: 0.4 mg/dL (ref 0.0–1.2)
CO2: 26 mmol/L (ref 20–29)
Calcium: 9.5 mg/dL (ref 8.7–10.2)
Chloride: 102 mmol/L (ref 96–106)
Creatinine, Ser: 0.65 mg/dL (ref 0.57–1.00)
GFR calc Af Amer: 116 mL/min/{1.73_m2} (ref >59)
GFR calc non Af Amer: 101 mL/min/{1.73_m2} (ref >59)
Globulin, Total: 2.1 g/dL (ref 1.5–4.5)
Glucose: 85 mg/dL (ref 65–99)
Potassium: 4 mmol/L (ref 3.5–5.2)
Sodium: 143 mmol/L (ref 134–144)
Total Protein: 6.7 g/dL (ref 6.0–8.5)

## 2020-04-03 LAB — CBC WITH DIFFERENTIAL/PLATELET
Basophils Absolute: 0 10*3/uL (ref 0.0–0.2)
Basos: 1 %
EOS (ABSOLUTE): 0.1 10*3/uL (ref 0.0–0.4)
Eos: 1 %
Hematocrit: 41.3 % (ref 34.0–46.6)
Hemoglobin: 14 g/dL (ref 11.1–15.9)
Immature Grans (Abs): 0 10*3/uL (ref 0.0–0.1)
Immature Granulocytes: 0 %
Lymphocytes Absolute: 1.2 10*3/uL (ref 0.7–3.1)
Lymphs: 19 %
MCH: 33.2 pg — ABNORMAL HIGH (ref 26.6–33.0)
MCHC: 33.9 g/dL (ref 31.5–35.7)
MCV: 98 fL — ABNORMAL HIGH (ref 79–97)
Monocytes Absolute: 0.7 10*3/uL (ref 0.1–0.9)
Monocytes: 11 %
Neutrophils Absolute: 4 10*3/uL (ref 1.4–7.0)
Neutrophils: 68 %
Platelets: 306 10*3/uL (ref 150–450)
RBC: 4.22 x10E6/uL (ref 3.77–5.28)
RDW: 12.8 % (ref 11.7–15.4)
WBC: 6 10*3/uL (ref 3.4–10.8)

## 2020-04-03 LAB — TROPONIN T: Troponin T (Highly Sensitive): <6 ng/L (ref 0–14)

## 2020-04-03 MED ORDER — NITROGLYCERIN 0.4 MG SL SUBL: 0.4000 mg | SUBLINGUAL_TABLET | SUBLINGUAL | 0 refills | Status: DC | PRN

## 2020-04-03 NOTE — Progress Notes (Signed)
Acute Office Visit  Subjective:    Patient ID: [name], female    DOB: 03/10/1966, 54 y.o.   MRN: [id number]  Chief Complaint  Patient presents with  . Anxiety    chest pain    HPI Patient is in today for chest pain this past Friday. Lasted about 4 hours. 6-7/10. Nausea, no vomiting during the chest pain. Vomited on Thursday. She increased amlodipine 5 mg one twice a day on her own. Unsure if this helped. Patient has a pmhx of myocardial infarction years ago. Recently she had COVID-19 infection approximately one month ago. It was complicated by pulmonary emboli and is being treated with Eliquis.  Past Medical History:  Diagnosis Date  . Allergic reaction to bee sting 09/09/2018  . Anxiety   . Asthma   . Bacterial vaginosis 07/07/2019  . Bulging lumbar disc 07/22/2015  . Cardiomyopathy (HCC) 07/18/2015   Overview:  Overview:  [place] 2009, normal coronaries  Last Assessment & Plan:  Relevant Hx: Course: Daily Update: Today's Plan: she has recently been seen by cards and is planned to have echo this Friday  Electronically signed by: Jenelle Mages, FNP 07/24/15 1042  . Carpal tunnel syndrome 07/22/2015  . Cervical strain 01/11/2019  . Chronic fatigue 07/22/2015  . Chronic rhinitis 07/22/2015  . Depression   . Dyslipidemia 07/18/2015  . Esophageal stricture 07/22/2015  . Essential hypertension   . Eustachian tube dysfunction 07/22/2015  . Excessive daytime sleepiness 05/16/2019  . Gastro-esophageal reflux disease without esophagitis   . Gastroesophageal reflux disease without esophagitis 07/18/2015   Last Assessment & Plan:  Relevant Hx: Course: Daily Update: Today's Plan:appears stable with ppi  Electronically signed by: Jenelle Mages, FNP 07/24/15 1043  . High risk medication use 07/22/2015  . History of blood clots   . Hot flash, menopausal 07/22/2015  . Hyperlipidemia 07/22/2015   Last Assessment & Plan:  Relevant Hx: Course: Daily Update: Today's Plan:stable with meds   Electronically signed by: Jenelle Mages, FNP 07/24/15 1112  . Hypertension   . Localized swelling of both lower legs 07/22/2015  . Low back strain, initial encounter 07/07/2019  . Lung mass   . Mixed hyperlipidemia 07/22/2015   Last Assessment & Plan:  Formatting of this note might be different from the original. Relevant Hx: Course: Daily Update: Today's Plan:stable with meds  Electronically signed by: Jenelle Mages, FNP 07/24/15 1112  . Morbid obesity (HCC) 07/22/2015  . Other insomnia 11/22/2018  . Pneumonia due to COVID-19 virus 01/17/2020  . Pulmonary embolus (HCC) 01/18/2020  . Screening for osteoporosis 02/27/2017   Formatting of this note might be different from the original. 2019: spine 3.1, L femur 1.3  . Seasonal allergic rhinitis due to pollen 07/22/2015  . Snores 07/22/2015   Last Assessment & Plan:  Relevant Hx: Course: Daily Update: Today's Plan:she was to have psg, but has not had this yet. Discussed that if this is present could be worse post-op with pain meds. None the less, she will have protective airway with/during surgery. She should still have psg study, but wants to wait until after knee repair  Electronically signed by: Jenelle Mages, FNP 07/24/15    Past Surgical History:  Procedure Laterality Date  . ABDOMINAL HYSTERECTOMY    . CHOLECYSTECTOMY    . FOOT SURGERY    . Knee replcement    . TONSILECTOMY/ADENOIDECTOMY WITH MYRINGOTOMY  1972    Family History  Problem Relation Age of Onset  .  Dementia Mother   . Heart attack Father     Social History   Socioeconomic History  . Marital status: Married    Spouse name: Not on file  . Number of children: 1  . Years of education: Not on file  . Highest education level: Not on file  Occupational History  . Not on file  Tobacco Use  . Smoking status: Former Smoker    Types: Cigarettes    Quit date: 06/16/2006    Years since quitting: 13.8  . Smokeless tobacco: Never Used  Vaping Use  . Vaping  Use: Never used  Substance and Sexual Activity  . Alcohol use: Yes  . Drug use: No  . Sexual activity: Not on file  Other Topics Concern  . Not on file  Social History Narrative  . Not on file   Social Determinants of Health   Financial Resource Strain:   . Difficulty of Paying Living Expenses: Not on file  Food Insecurity:   . Worried About Programme researcher, broadcasting/film/video in the Last Year: Not on file  . Ran Out of Food in the Last Year: Not on file  Transportation Needs:   . Lack of Transportation (Medical): Not on file  . Lack of Transportation (Non-Medical): Not on file  Physical Activity:   . Days of Exercise per Week: Not on file  . Minutes of Exercise per Session: Not on file  Stress:   . Feeling of Stress : Not on file  Social Connections:   . Frequency of Communication with Friends and Family: Not on file  . Frequency of Social Gatherings with Friends and Family: Not on file  . Attends Religious Services: Not on file  . Active Member of Clubs or Organizations: Not on file  . Attends Banker Meetings: Not on file  . Marital Status: Not on file  Intimate Partner Violence:   . Fear of Current or Ex-Partner: Not on file  . Emotionally Abused: Not on file  . Physically Abused: Not on file  . Sexually Abused: Not on file    Outpatient Medications Prior to Visit  Medication Sig Dispense Refill  . albuterol (VENTOLIN HFA) 108 (90 Base) MCG/ACT inhaler Inhale 1-2 puffs into the lungs every 6 (six) hours as needed for wheezing or shortness of breath.    Marland Kitchen atorvastatin (LIPITOR) 10 MG tablet Take 1 tablet (10 mg total) by mouth daily. 90 tablet 1  . ELIQUIS 5 MG TABS tablet Take 5 mg by mouth 2 (two) times daily.     . fluticasone (FLONASE) 50 MCG/ACT nasal spray USE 2 SPRAYS IN EACH NOSTRIL ONCE DAILY    . furosemide (LASIX) 20 MG tablet TAKE 1 TABLET DAILY 90 tablet 0  . levocetirizine (XYZAL) 5 MG tablet Take 1 tablet by mouth daily.    . Multiple Vitamin  (MULTIVITAMIN) tablet Take 1 tablet by mouth daily.      Marland Kitchen omeprazole (PRILOSEC) 40 MG capsule Take 40 mg by mouth daily.      . ondansetron (ZOFRAN-ODT) 4 MG disintegrating tablet Take by mouth.    . sertraline (ZOLOFT) 100 MG tablet Take 1 tablet (100 mg total) by mouth daily. (Patient taking differently: Take 150 mg by mouth daily. ) 90 tablet 0  . amLODipine (NORVASC) 5 MG tablet Take 1 tablet (5 mg total) by mouth daily. 90 tablet 0  . famotidine (PEPCID) 20 MG tablet Take 20 mg by mouth daily.    . nitroGLYCERIN (NITROSTAT) 0.4  MG SL tablet Place 0.4 mg under the tongue.    . traMADol (ULTRAM) 50 MG tablet Take 50 mg by mouth 3 (three) times daily as needed.     No facility-administered medications prior to visit.    Allergies  Allergen Reactions  . Demerol Anaphylaxis  . Meperidine Anaphylaxis  . Sulfamethoxazole Itching    With redness to skin   . Citalopram Hydrobromide Other (See Comments)    Unknown  . Hydrocodone   . Hydrocodone-Acetaminophen Other (See Comments)    Unknown  . Metoprolol Tartrate Other (See Comments)    Chest discomfort, palpations  . Oxycodone-Acetaminophen Other (See Comments)    Unknown  . Diltiazem Rash  . Metoprolol Tartrate Palpitations    Review of Systems  Constitutional: Negative for chills, fatigue and fever.  HENT: Negative for congestion, rhinorrhea and sore throat.   Respiratory: Negative for cough and shortness of breath.   Cardiovascular: Positive for chest pain and palpitations.  Gastrointestinal: Positive for abdominal pain and vomiting. Negative for constipation and diarrhea.  Genitourinary: Negative for dysuria and urgency.  Musculoskeletal: Positive for back pain and myalgias.  Skin: Positive for rash.  Neurological: Positive for headaches. Negative for dizziness, weakness and light-headedness.  Psychiatric/Behavioral: Negative for dysphoric mood. The patient is not nervous/anxious.        Objective:    Physical  Exam Vitals reviewed.  Constitutional:      Appearance: Normal appearance.  Neck:     Vascular: No carotid bruit.  Cardiovascular:     Rate and Rhythm: Normal rate and regular rhythm.     Pulses: Normal pulses.     Heart sounds: Normal heart sounds.  Pulmonary:     Effort: Pulmonary effort is normal. No respiratory distress.     Breath sounds: Normal breath sounds.  Abdominal:     General: Abdomen is flat. Bowel sounds are normal.     Palpations: Abdomen is soft.     Tenderness: There is no abdominal tenderness.  Neurological:     Mental Status: She is alert and oriented to person, place, and time.  Psychiatric:        Mood and Affect: Mood normal.        Behavior: Behavior normal.    BP 122/78 (BP Location: Right Arm, Patient Position: Sitting, Cuff Size: Large)   Pulse 92   Temp 97.9 F (36.6 C) (Temporal)   Wt 227 lb 12.8 oz (103.3 kg)   SpO2 98%   BMI 40.35 kg/m  Wt Readings from Last 3 Encounters:  04/06/20 229 lb (103.9 kg)  04/03/20 227 lb 12.8 oz (103.3 kg)  03/07/20 223 lb (101.2 kg)    Health Maintenance Due  Topic Date Due  . Hepatitis C Screening  Never done  . COVID-19 Vaccine (1) Never done  . HIV Screening  Never done  . TETANUS/TDAP  Never done  . PAP SMEAR-Modifier  Never done    There are no preventive care reminders to display for this patient.   Lab Results  Component Value Date   TSH 3.640 02/01/2020   Lab Results  Component Value Date   WBC 6.0 04/03/2020   HGB 14.0 04/03/2020   HCT 41.3 04/03/2020   MCV 98 (H) 04/03/2020   PLT 306 04/03/2020   Lab Results  Component Value Date   NA 143 04/03/2020   K 4.0 04/03/2020   CO2 26 04/03/2020   GLUCOSE 85 04/03/2020   BUN 9 04/03/2020   CREATININE 0.65 04/03/2020  BILITOT 0.4 04/03/2020   ALKPHOS 126 (H) 04/03/2020   AST 19 04/03/2020   ALT 18 04/03/2020   PROT 6.7 04/03/2020   ALBUMIN 4.6 04/03/2020   CALCIUM 9.5 04/03/2020   Lab Results  Component Value Date   CHOL 240  (H) 03/07/2020   Lab Results  Component Value Date   HDL 79 03/07/2020   Lab Results  Component Value Date   LDLCALC 142 (H) 03/07/2020   Lab Results  Component Value Date   TRIG 112 03/07/2020   Lab Results  Component Value Date   CHOLHDL 3.0 03/07/2020   No results found for: HGBA1C     Assessment & Plan:  1. Other chest pain I discussed pt with cardiology who will get her in for possible stress test or CTA of heart.  - CBC with Differential/Platelet - Comprehensive metabolic panel - Troponin T - EKG: NSR. No ST changes. NTG rx given and discussed how to take.   2. Dyspnea on exertion  Again, concerned may be cardiac.   3. Pulmonary embolism and infarction [facility])  The current medical regimen is effective;  continue present plan and medications.  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 Increase fluids.   5. GAD (generalized anxiety disorder)  Recommend increase zoloft to 100 mg 1 1/2 daily.  6. Panic attack  rx for lorazepam prn.   7. Hypertensive heart disease with heart failure (HCC)  Increase amlodipine to 10 mg once daily.  Sent new rx for amlodipine 10 mg once daily.   The current medical regimen is effective;  continue present plan and medications.  8. Encounter for immunization - Flu Vaccine MDCK QUAD PF    Meds ordered this encounter  Medications  . nitroGLYCERIN (NITROSTAT) 0.4 MG SL tablet    Sig: Place 1 tablet (0.4 mg total) under the tongue every 5 (five) minutes as needed for chest pain (x3 total. call ems if chest pain does not resolve after one, call ems.).    Dispense:  25 tablet    Refill:  0  . LORazepam (ATIVAN) 0.5 MG tablet    Sig: Take 1 tablet (0.5 mg total) by mouth daily as needed for anxiety (panic attacks).    Dispense:  30 tablet    Refill:  0  . amLODipine (NORVASC) 10 MG tablet    Sig: Take 1 tablet (10 mg total) by mouth daily.    Dispense:  90 tablet    Refill:  3    Orders Placed This Encounter  Procedures  .  Flu Vaccine MDCK QUAD PF  . CBC with Differential/Platelet  . Comprehensive metabolic panel  . Troponin T     Follow-up: Return in about 4 weeks (around 05/01/2020).  An After Visit Summary was printed and given to the patient.  [name] [phone]

## 2020-04-03 NOTE — Patient Instructions (Addendum)
Chest pain: Stat labs sent.  Nitroglycerine refilled.  Use if chest pain recurs. May take every 5 minutes x 3 in 24 hours. If have to take a second nitro, call EMS>   Anxiety: Increase zoloft to 100 mg 1 1/2 daily.  Lorazepam 0.5 mg once daily as needed for severe anxiety/panic attacks.  Hypertension: Increase amlodipine to 10 mg daily (may take 5 mg one twice a day or at the same time.

## 2020-04-05 DIAGNOSIS — F32A Depression, unspecified: Secondary | ICD-10-CM | POA: Insufficient documentation

## 2020-04-05 DIAGNOSIS — Z86718 Personal history of other venous thrombosis and embolism: Secondary | ICD-10-CM | POA: Insufficient documentation

## 2020-04-05 DIAGNOSIS — I10 Essential (primary) hypertension: Secondary | ICD-10-CM | POA: Insufficient documentation

## 2020-04-05 DIAGNOSIS — K219 Gastro-esophageal reflux disease without esophagitis: Secondary | ICD-10-CM | POA: Insufficient documentation

## 2020-04-06 ENCOUNTER — Encounter: Payer: Self-pay | Admitting: Cardiology

## 2020-04-06 ENCOUNTER — Other Ambulatory Visit: Payer: Self-pay

## 2020-04-06 ENCOUNTER — Ambulatory Visit (INDEPENDENT_AMBULATORY_CARE_PROVIDER_SITE_OTHER): Payer: BC Managed Care – PPO | Admitting: Cardiology

## 2020-04-06 VITALS — BP 130/80 | HR 88 | Ht 63.0 in | Wt 229.0 lb

## 2020-04-06 DIAGNOSIS — I2782 Chronic pulmonary embolism: Secondary | ICD-10-CM

## 2020-04-06 DIAGNOSIS — I428 Other cardiomyopathies: Secondary | ICD-10-CM | POA: Diagnosis not present

## 2020-04-06 DIAGNOSIS — I10 Essential (primary) hypertension: Secondary | ICD-10-CM

## 2020-04-06 DIAGNOSIS — R079 Chest pain, unspecified: Secondary | ICD-10-CM | POA: Diagnosis not present

## 2020-04-06 NOTE — Patient Instructions (Addendum)
Medication Instructions:  Your physician recommends that you continue on your current medications as directed. Please refer to the Current Medication list given to you today.  *If you need a refill on your cardiac medications before your next appointment, please call your pharmacy*   Lab Work: None ordered   Testing/Procedures: Your physician has requested that you have cardiac CT. Cardiac computed tomography (CT) is a painless test that uses an x-ray machine to take clear, detailed pictures of your heart.  Please follow instructions below    Follow-Up: At [facility], you and your health needs are our priority.  As part of our continuing mission to provide you with exceptional heart care, we have created designated Provider Care Teams.  These Care Teams include your primary Cardiologist (physician) and Advanced Practice Providers (APPs -  Physician Assistants and Nurse Practitioners) who all work together to provide you with the care you need, when you need it.   Your next appointment:   3 month(s)  The format for your next appointment:   In Person  Provider:   Jenne Campus, MD    Thank you for choosing [facility]!!     Other Instructions  Your cardiac CT will be scheduled at:  [facility] [address] [place], [place] [address] [phone]   Please arrive at the [facility] main entrance of [facility] 30 minutes prior to test start time. Proceed to the [facility] Radiology Department (first floor) to check-in and test prep.   Please follow these instructions carefully (unless otherwise directed):  On the Night Before the Test: . Be sure to Drink plenty of water. . Do not consume any caffeinated/decaffeinated beverages or chocolate 12 hours prior to your test. . Do not take any antihistamines 12 hours prior to your test.  On the Day of the Test: . Drink plenty of water. Do not drink any water within one hour of the  test. . Do not eat any food 4 hours prior to the test. . You may take your regular medications prior to the test.  . HOLD Furosemide/Hydrochlorothiazide morning of the test. . FEMALES- please wear underwire-free bra if available       After the Test: . Drink plenty of water. . After receiving IV contrast, you may experience a mild flushed feeling. This is normal. . On occasion, you may experience a mild rash up to 24 hours after the test. This is not dangerous. If this occurs, you can take Benadryl 25 mg and increase your fluid intake. . If you experience trouble breathing, this can be serious. If it is severe call 911 IMMEDIATELY. If it is mild, please call our office. . If you take any of these medications: Glipizide/Metformin, Avandament, Glucavance, please do not take 48 hours after completing test unless otherwise instructed.   Once we have confirmed authorization from your insurance company, we will call you to set up a date and time for your test. Based on how quickly your insurance processes prior authorizations requests, please allow up to 4 weeks to be contacted for scheduling your Cardiac CT appointment. Be advised that routine Cardiac CT appointments could be scheduled as many as 8 weeks after your provider has ordered it.  For non-scheduling related questions, please contact the cardiac imaging nurse navigator should you have any questions/concerns: Marchia Bond, Cardiac Imaging Nurse Navigator Burley Saver, Interim Cardiac Imaging Nurse [facility] Direct Office Dial: [phone]   For scheduling  needs, including cancellations and rescheduling, please call Vivien Rota at [phone], option 3.

## 2020-04-06 NOTE — Progress Notes (Signed)
Cardiology Office Note:    Date:  04/06/2020   ID:  [name], DOB 08/28/1965, MRN [id number]  PCP:  Marianne Sofia, PA-C  Cardiologist:  Gypsy Balsam, MD    Referring MD: Marianne Sofia, PA-C   Chief Complaint  Patient presents with  . Chest Pain  I am having chest pain  History of Present Illness:    [name] is a 54 y.o. female with past medical history significant for myocardial infarction years ago, cardiac catheterization after that showed coronary arteries without obstruction, her left ventricle ejection fraction overall improved.  Recently have seen her after she have COVID-19 infection.  She eventually ended up having pulmonary emboli and she is being treated with Eliquis for that.  She said that for last few weeks has been experiencing chest pain.  She describes as uneasy sensation that happen at the different times.  At the same time she is able to walk climb stairs with no difficulties.  There is some atypical characteristic of the pain.  This is completely different pain that she got when she developed her pulmonary emboli.  Past Medical History:  Diagnosis Date  . Allergic reaction to bee sting 09/09/2018  . Anxiety   . Asthma   . Bacterial vaginosis 07/07/2019  . Bulging lumbar disc 07/22/2015  . Cardiomyopathy (HCC) 07/18/2015   Overview:  Overview:  Taka Tsubo 2009, normal coronaries  Last Assessment & Plan:  Relevant Hx: Course: Daily Update: Today's Plan: she has recently been seen by cards and is planned to have echo this Friday  Electronically signed by: Jenelle Mages, FNP 07/24/15 1042  . Carpal tunnel syndrome 07/22/2015  . Cervical strain 01/11/2019  . Chronic fatigue 07/22/2015  . Chronic rhinitis 07/22/2015  . Depression   . Dyslipidemia 07/18/2015  . Esophageal stricture 07/22/2015  . Essential hypertension   . Eustachian tube dysfunction 07/22/2015  . Excessive daytime sleepiness 05/16/2019  . Gastro-esophageal reflux disease without esophagitis   .  Gastroesophageal reflux disease without esophagitis 07/18/2015   Last Assessment & Plan:  Relevant Hx: Course: Daily Update: Today's Plan:appears stable with ppi  Electronically signed by: Jenelle Mages, FNP 07/24/15 1043  . High risk medication use 07/22/2015  . History of blood clots   . Hot flash, menopausal 07/22/2015  . Hyperlipidemia 07/22/2015   Last Assessment & Plan:  Relevant Hx: Course: Daily Update: Today's Plan:stable with meds  Electronically signed by: Jenelle Mages, FNP 07/24/15 1112  . Hypertension   . Localized swelling of both lower legs 07/22/2015  . Low back strain, initial encounter 07/07/2019  . Lung mass   . Mixed hyperlipidemia 07/22/2015   Last Assessment & Plan:  Formatting of this note might be different from the original. Relevant Hx: Course: Daily Update: Today's Plan:stable with meds  Electronically signed by: Jenelle Mages, FNP 07/24/15 1112  . Morbid obesity (HCC) 07/22/2015  . Other insomnia 11/22/2018  . Pneumonia due to COVID-19 virus 01/17/2020  . Pulmonary embolus (HCC) 01/18/2020  . Screening for osteoporosis 02/27/2017   Formatting of this note might be different from the original. 2019: spine 3.1, L femur 1.3  . Seasonal allergic rhinitis due to pollen 07/22/2015  . Snores 07/22/2015   Last Assessment & Plan:  Relevant Hx: Course: Daily Update: Today's Plan:she was to have psg, but has not had this yet. Discussed that if this is present could be worse post-op with pain meds. None the less, she will have protective airway with/during surgery.  She should still have psg study, but wants to wait until after knee repair  Electronically signed by: Jenelle Mages, FNP 07/24/15    Past Surgical History:  Procedure Laterality Date  . ABDOMINAL HYSTERECTOMY    . CHOLECYSTECTOMY    . FOOT SURGERY    . Knee replcement    . TONSILECTOMY/ADENOIDECTOMY WITH MYRINGOTOMY  1972    Current Medications: Current Meds  Medication Sig  . albuterol  (VENTOLIN HFA) 108 (90 Base) MCG/ACT inhaler Inhale 1-2 puffs into the lungs every 6 (six) hours as needed for wheezing or shortness of breath.  Marland Kitchen amLODipine (NORVASC) 5 MG tablet Take 5 mg by mouth in the morning and at bedtime.  Marland Kitchen atorvastatin (LIPITOR) 10 MG tablet Take 1 tablet (10 mg total) by mouth daily.  Marland Kitchen ELIQUIS 5 MG TABS tablet Take 5 mg by mouth 2 (two) times daily.   . fluticasone (FLONASE) 50 MCG/ACT nasal spray USE 2 SPRAYS IN EACH NOSTRIL ONCE DAILY  . furosemide (LASIX) 20 MG tablet TAKE 1 TABLET DAILY  . levocetirizine (XYZAL) 5 MG tablet Take 1 tablet by mouth daily.  . Multiple Vitamin (MULTIVITAMIN) tablet Take 1 tablet by mouth daily.    . nitroGLYCERIN (NITROSTAT) 0.4 MG SL tablet Place 1 tablet (0.4 mg total) under the tongue every 5 (five) minutes as needed for chest pain (x3 total. call ems if chest pain does not resolve after one, call ems.).  Marland Kitchen omeprazole (PRILOSEC) 40 MG capsule Take 40 mg by mouth daily.    . ondansetron (ZOFRAN-ODT) 4 MG disintegrating tablet Take by mouth.  . sertraline (ZOLOFT) 100 MG tablet Take 1 tablet (100 mg total) by mouth daily. (Patient taking differently: Take 150 mg by mouth daily. )     Allergies:   Demerol, Meperidine, Sulfamethoxazole, Citalopram hydrobromide, Hydrocodone, Hydrocodone-acetaminophen, Metoprolol tartrate, Oxycodone-acetaminophen, Diltiazem, and Metoprolol tartrate   Social History   Socioeconomic History  . Marital status: Married    Spouse name: Not on file  . Number of children: 1  . Years of education: Not on file  . Highest education level: Not on file  Occupational History  . Not on file  Tobacco Use  . Smoking status: Former Smoker    Types: Cigarettes    Quit date: 06/16/2006    Years since quitting: 13.8  . Smokeless tobacco: Never Used  Vaping Use  . Vaping Use: Never used  Substance and Sexual Activity  . Alcohol use: Yes  . Drug use: No  . Sexual activity: Not on file  Other Topics Concern  .  Not on file  Social History Narrative  . Not on file   Social Determinants of Health   Financial Resource Strain:   . Difficulty of Paying Living Expenses: Not on file  Food Insecurity:   . Worried About Programme researcher, broadcasting/film/video in the Last Year: Not on file  . Ran Out of Food in the Last Year: Not on file  Transportation Needs:   . Lack of Transportation (Medical): Not on file  . Lack of Transportation (Non-Medical): Not on file  Physical Activity:   . Days of Exercise per Week: Not on file  . Minutes of Exercise per Session: Not on file  Stress:   . Feeling of Stress : Not on file  Social Connections:   . Frequency of Communication with Friends and Family: Not on file  . Frequency of Social Gatherings with Friends and Family: Not on file  . Attends Religious Services:  Not on file  . Active Member of Clubs or Organizations: Not on file  . Attends Banker Meetings: Not on file  . Marital Status: Not on file     Family History: The patient's family history includes Dementia in her mother; Heart attack in her father. ROS:   Please see the history of present illness.    All 14 point review of systems negative except as described per history of present illness  EKGs/Labs/Other Studies Reviewed:      Recent Labs: 02/01/2020: TSH 3.640 04/03/2020: ALT 18; BUN 9; Creatinine, Ser 0.65; Hemoglobin 14.0; Platelets 306; Potassium 4.0; Sodium 143  Recent Lipid Panel    Component Value Date/Time   CHOL 240 (H) 03/07/2020 0853   TRIG 112 03/07/2020 0853   HDL 79 03/07/2020 0853   CHOLHDL 3.0 03/07/2020 0853   LDLCALC 142 (H) 03/07/2020 0853    Physical Exam:    VS:  BP 130/80 (BP Location: Left Arm, Patient Position: Sitting, Cuff Size: Large)   Pulse 88   Ht 5\' 3"  (1.6 m)   Wt 229 lb (103.9 kg)   SpO2 95%   BMI 40.57 kg/m     Wt Readings from Last 3 Encounters:  04/06/20 229 lb (103.9 kg)  04/03/20 227 lb 12.8 oz (103.3 kg)  03/07/20 223 lb (101.2 kg)      GEN:  Well nourished, well developed in no acute distress HEENT: Normal NECK: No JVD; No carotid bruits LYMPHATICS: No lymphadenopathy CARDIAC: RRR, no murmurs, no rubs, no gallops RESPIRATORY:  Clear to auscultation without rales, wheezing or rhonchi  ABDOMEN: Soft, non-tender, non-distended MUSCULOSKELETAL:  No edema; No deformity  SKIN: Warm and dry LOWER EXTREMITIES: no swelling NEUROLOGIC:  Alert and oriented x 3 PSYCHIATRIC:  Normal affect   ASSESSMENT:    1. Other cardiomyopathy (HCC)   2. Other chronic pulmonary embolism without acute cor pulmonale (HCC)   3. Primary hypertension    PLAN:    In order of problems listed above:  1. Cardiomyopathy with normalization.  Continue present management. 2. Chest pain which is somewhat atypical but she does have history of myocardial infarction in the past, likely cardiac catheterization of the time did not show an obstructive lesions.  Last evaluation of her coronary arteries has been done in 2019.  In form of stress testing.  We discussed option for the situation we elected to pursue coronary CT angio. 3. Essential hypertension blood pressure is normal today however she describes situation when she got chest pain she will also have elevated blood pressure she thinks it may be related to her anxiety which could be the case however I need to rule out significant obstructive disease. 4. Dyslipidemia she is taking Lipitor 10 which I will continue.  I do see her LDL in K PN from March 07, 2020 which was 142 HDL 79.  We will augment her therapy.   Medication Adjustments/Labs and Tests Ordered: Current medicines are reviewed at length with the patient today.  Concerns regarding medicines are outlined above.  No orders of the defined types were placed in this encounter.  Medication changes: No orders of the defined types were placed in this encounter.   Signed, March 09, 2020, MD, [facility] 04/06/2020 2:07 PM    [facility]

## 2020-04-08 MED ORDER — AMLODIPINE BESYLATE 10 MG PO TABS: 10.0000 mg | ORAL_TABLET | Freq: Every day | ORAL | 3 refills | Status: DC

## 2020-04-08 MED ORDER — LORAZEPAM 0.5 MG PO TABS: 0.5000 mg | ORAL_TABLET | Freq: Every day | ORAL | 0 refills | Status: DC | PRN

## 2020-04-09 ENCOUNTER — Encounter: Payer: Self-pay | Admitting: Family Medicine

## 2020-05-04 ENCOUNTER — Encounter: Payer: Self-pay | Admitting: Family Medicine

## 2020-05-04 ENCOUNTER — Other Ambulatory Visit: Payer: Self-pay

## 2020-05-04 ENCOUNTER — Telehealth: Payer: Self-pay | Admitting: Cardiology

## 2020-05-04 ENCOUNTER — Ambulatory Visit (INDEPENDENT_AMBULATORY_CARE_PROVIDER_SITE_OTHER): Payer: Medicare Other | Admitting: Family Medicine

## 2020-05-04 VITALS — BP 152/92 | HR 81 | Temp 96.8°F | Ht 63.0 in | Wt 227.0 lb

## 2020-05-04 DIAGNOSIS — I10 Essential (primary) hypertension: Secondary | ICD-10-CM | POA: Diagnosis not present

## 2020-05-04 DIAGNOSIS — L650 Telogen effluvium: Secondary | ICD-10-CM | POA: Diagnosis not present

## 2020-05-04 DIAGNOSIS — E782 Mixed hyperlipidemia: Secondary | ICD-10-CM

## 2020-05-04 DIAGNOSIS — I2699 Other pulmonary embolism without acute cor pulmonale: Secondary | ICD-10-CM | POA: Diagnosis not present

## 2020-05-04 DIAGNOSIS — I110 Hypertensive heart disease with heart failure: Secondary | ICD-10-CM

## 2020-05-04 NOTE — Progress Notes (Signed)
Subjective:  Patient ID: [name], female    DOB: 07-02-1965  Age: 54 y.o. MRN: [id number]  Chief Complaint  Patient presents with  . Hypertension  . Depression    HPI  Hypertension- patient states that her cardiologist recommended that she take amlodipine 1 in the a.m. and 1 in the p.m. patient stated she has not taken her bp medication this a.m. since she has not eaten.  Patient was evaluated by Dr. Bing Matter for intermittent chest pain.  Dr. Bing Matter was concerned of a CTA of the coronary arteries.  Patient does have nitroglycerin on hand.  Patient had a pulmonary embolism following her diagnosis of COVID-19 in July 2021.  She is currently on Eliquis 5 mg twice daily.  Denies shortness of breath.  Hyperlipidemia: Currently on Lipitor 10 mg once daily.  Depression-reports that since increasing her zoloft 100 gm 1 1/2 daily has helped her depression.  She has had very vivid dreams. Has taken approximately 6 lorazepam out of the 30 I prescribed over the last month.    Current Outpatient Medications on File Prior to Visit  Medication Sig Dispense Refill  . albuterol (VENTOLIN HFA) 108 (90 Base) MCG/ACT inhaler Inhale 1-2 puffs into the lungs every 6 (six) hours as needed for wheezing or shortness of breath.    Marland Kitchen amLODipine (NORVASC) 10 MG tablet Take 1 tablet (10 mg total) by mouth daily. (Patient taking differently: Take 10 mg by mouth in the morning and at bedtime. ) 90 tablet 3  . amLODipine (NORVASC) 5 MG tablet Take 5 mg by mouth in the morning and at bedtime.    Marland Kitchen atorvastatin (LIPITOR) 10 MG tablet Take 1 tablet (10 mg total) by mouth daily. 90 tablet 1  . ELIQUIS 5 MG TABS tablet Take 5 mg by mouth 2 (two) times daily.     . fluticasone (FLONASE) 50 MCG/ACT nasal spray USE 2 SPRAYS IN EACH NOSTRIL ONCE DAILY    . furosemide (LASIX) 20 MG tablet TAKE 1 TABLET DAILY 90 tablet 0  . levocetirizine (XYZAL) 5 MG tablet Take 1 tablet by mouth daily.    Marland Kitchen LORazepam (ATIVAN) 0.5  MG tablet Take 1 tablet (0.5 mg total) by mouth daily as needed for anxiety (panic attacks). 30 tablet 0  . Multiple Vitamin (MULTIVITAMIN) tablet Take 1 tablet by mouth daily.      . nitroGLYCERIN (NITROSTAT) 0.4 MG SL tablet Place 1 tablet (0.4 mg total) under the tongue every 5 (five) minutes as needed for chest pain (x3 total. call ems if chest pain does not resolve after one, call ems.). 25 tablet 0  . omeprazole (PRILOSEC) 40 MG capsule Take 40 mg by mouth daily.      . ondansetron (ZOFRAN-ODT) 4 MG disintegrating tablet Take by mouth.    . sertraline (ZOLOFT) 100 MG tablet Take 1 tablet (100 mg total) by mouth daily. (Patient taking differently: Take 150 mg by mouth daily. ) 90 tablet 0   No current facility-administered medications on file prior to visit.   Past Medical History:  Diagnosis Date  . Allergic reaction to bee sting 09/09/2018  . Anxiety   . Asthma   . Bacterial vaginosis 07/07/2019  . Bulging lumbar disc 07/22/2015  . Cardiomyopathy (HCC) 07/18/2015   Overview:  Overview:  Taka Tsubo 2009, normal coronaries  Last Assessment & Plan:  Relevant Hx: Course: Daily Update: Today's Plan: she has recently been seen by cards and is planned to have echo this Friday  Electronically signed by: Jenelle Mages, FNP 07/24/15 1042  . Carpal tunnel syndrome 07/22/2015  . Cervical strain 01/11/2019  . Chronic fatigue 07/22/2015  . Chronic rhinitis 07/22/2015  . Depression   . Dyslipidemia 07/18/2015  . Esophageal stricture 07/22/2015  . Essential hypertension   . Eustachian tube dysfunction 07/22/2015  . Excessive daytime sleepiness 05/16/2019  . Gastro-esophageal reflux disease without esophagitis   . Gastroesophageal reflux disease without esophagitis 07/18/2015   Last Assessment & Plan:  Relevant Hx: Course: Daily Update: Today's Plan:appears stable with ppi  Electronically signed by: Jenelle Mages, FNP 07/24/15 1043  . High risk medication use 07/22/2015  . History of blood clots     . Hot flash, menopausal 07/22/2015  . Hyperlipidemia 07/22/2015   Last Assessment & Plan:  Relevant Hx: Course: Daily Update: Today's Plan:stable with meds  Electronically signed by: Jenelle Mages, FNP 07/24/15 1112  . Hypertension   . Localized swelling of both lower legs 07/22/2015  . Low back strain, initial encounter 07/07/2019  . Lung mass   . Mixed hyperlipidemia 07/22/2015   Last Assessment & Plan:  Formatting of this note might be different from the original. Relevant Hx: Course: Daily Update: Today's Plan:stable with meds  Electronically signed by: Jenelle Mages, FNP 07/24/15 1112  . Morbid obesity (HCC) 07/22/2015  . Other insomnia 11/22/2018  . Pneumonia due to COVID-19 virus 01/17/2020  . Pulmonary embolus (HCC) 01/18/2020  . Screening for osteoporosis 02/27/2017   Formatting of this note might be different from the original. 2019: spine 3.1, L femur 1.3  . Seasonal allergic rhinitis due to pollen 07/22/2015  . Snores 07/22/2015   Last Assessment & Plan:  Relevant Hx: Course: Daily Update: Today's Plan:she was to have psg, but has not had this yet. Discussed that if this is present could be worse post-op with pain meds. None the less, she will have protective airway with/during surgery. She should still have psg study, but wants to wait until after knee repair  Electronically signed by: Jenelle Mages, FNP 07/24/15   Past Surgical History:  Procedure Laterality Date  . ABDOMINAL HYSTERECTOMY    . CHOLECYSTECTOMY    . FOOT SURGERY    . Knee replcement    . TONSILECTOMY/ADENOIDECTOMY WITH MYRINGOTOMY  1972    Family History  Problem Relation Age of Onset  . Dementia Mother   . Heart attack Father    Social History   Socioeconomic History  . Marital status: Married    Spouse name: Not on file  . Number of children: 1  . Years of education: Not on file  . Highest education level: Not on file  Occupational History  . Not on file  Tobacco Use  . Smoking  status: Former Smoker    Types: Cigarettes    Quit date: 06/16/2006    Years since quitting: 13.8  . Smokeless tobacco: Never Used  Vaping Use  . Vaping Use: Never used  Substance and Sexual Activity  . Alcohol use: Yes  . Drug use: No  . Sexual activity: Not on file  Other Topics Concern  . Not on file  Social History Narrative  . Not on file   Social Determinants of Health   Financial Resource Strain:   . Difficulty of Paying Living Expenses: Not on file  Food Insecurity:   . Worried About Programme researcher, broadcasting/film/video in the Last Year: Not on file  . Ran Out of Food in the Last Year:  Not on file  Transportation Needs:   . Lack of Transportation (Medical): Not on file  . Lack of Transportation (Non-Medical): Not on file  Physical Activity:   . Days of Exercise per Week: Not on file  . Minutes of Exercise per Session: Not on file  Stress:   . Feeling of Stress : Not on file  Social Connections:   . Frequency of Communication with Friends and Family: Not on file  . Frequency of Social Gatherings with Friends and Family: Not on file  . Attends Religious Services: Not on file  . Active Member of Clubs or Organizations: Not on file  . Attends Banker Meetings: Not on file  . Marital Status: Not on file    Review of Systems  Constitutional: Negative for chills, fatigue and fever.  HENT: Negative for congestion, ear pain and sore throat.   Respiratory: Negative for cough and shortness of breath.   Cardiovascular: Negative for chest pain.  Gastrointestinal: Negative for abdominal pain, constipation, diarrhea, nausea and vomiting.  Genitourinary: Negative for dysuria and urgency.  Musculoskeletal: Negative for arthralgias and myalgias.  Skin: Negative for rash.       Complaining of hair loss  Neurological: Negative for dizziness and headaches.  Psychiatric/Behavioral: Negative for dysphoric mood. The patient is not nervous/anxious.      Objective:  BP (!) 152/92    Pulse 81   Temp (!) 96.8 F (36 C)   Ht 5\' 3"  (1.6 m)   Wt 227 lb (103 kg)   SpO2 99%   BMI 40.21 kg/m   BP/Weight 05/04/2020 04/06/2020 04/03/2020  Systolic BP 152 130 122  Diastolic BP 92 80 78  Wt. (Lbs) 227 229 227.8  BMI 40.21 40.57 40.35    Physical Exam Vitals reviewed.  Constitutional:      Appearance: Normal appearance. She is normal weight.  Neck:     Vascular: No carotid bruit.  Cardiovascular:     Rate and Rhythm: Normal rate and regular rhythm.     Pulses: Normal pulses.     Heart sounds: Normal heart sounds.  Pulmonary:     Effort: Pulmonary effort is normal. No respiratory distress.     Breath sounds: Normal breath sounds.  Abdominal:     General: Abdomen is flat. Bowel sounds are normal.     Palpations: Abdomen is soft.     Tenderness: There is no abdominal tenderness.  Neurological:     Mental Status: She is alert and oriented to person, place, and time.  Psychiatric:        Mood and Affect: Mood normal.        Behavior: Behavior normal.     Diabetic Foot Exam - Simple   No data filed       Lab Results  Component Value Date   WBC 6.0 04/03/2020   HGB 14.0 04/03/2020   HCT 41.3 04/03/2020   PLT 306 04/03/2020   GLUCOSE 85 04/03/2020   CHOL 240 (H) 03/07/2020   TRIG 112 03/07/2020   HDL 79 03/07/2020   LDLCALC 142 (H) 03/07/2020   ALT 18 04/03/2020   AST 19 04/03/2020   NA 143 04/03/2020   K 4.0 04/03/2020   CL 102 04/03/2020   CREATININE 0.65 04/03/2020   BUN 9 04/03/2020   CO2 26 04/03/2020   TSH 3.640 02/01/2020      Assessment & Plan:   1. Essential hypertension Continue amlodipine 5 mg twice daily.  Agree with pursuit of  CTA of coronary arteries.  Patient has had no further chest pain.  Check labs today. - TSH - CBC with Differential/Platelet - Comprehensive metabolic panel  2. Mixed hyperlipidemia Cholesterol had improved.  Continue Lipitor 10 mg once daily for now. Await cholesterol panel to determine if need to  increase her medication.  Patient is tolerating the medicine. - Lipid panel  3. Pulmonary embolism and infarction (HCC) Continue Eliquis.  Will likely be able to come off of this and February 2022.  May consult with Dr. Bing Matter concerning this.  As well as literature as her pulmonary embolus was during her Covid infection.  Strongly encouraged the patient to get her Covid vaccines.  Offered to set her up an appointment here.  She is going to think about it.  Refuses flu shot.  4. Hypertensive heart disease with heart failure (HCC) See above.  Continue amlodipine.  Await CTA of coronary arteries.  5. Telogen effluvium - Ferritin -TSH  Orders Placed This Encounter  Procedures  . TSH  . CBC with Differential/Platelet  . Comprehensive metabolic panel  . Lipid panel  . Ferritin     I spent 30 minutes dedicated to the care of this patient on the date of this encounter to include face-to-face time with the patient, as well asreview of cardiac notes, my previous notes and lab work, established history of present illness, perform physical exam, and assessment and plan as stated above.  Patient agreed with plan.  Patient is also to work on eating healthy and exercise.  Patient to let us know if she continues to have vivid dreams that become disturbing.  If this continues and we need to change her Zoloft she is to give [place] a call.  Follow-up: Return in about 3 months (around 08/04/2020) for Fasting.  An After Visit Summary was printed and given to the patient.  Blane Ohara, MD [facility] [phone]

## 2020-05-04 NOTE — Telephone Encounter (Signed)
[name] is calling wanting to know if prior authorization was received for her CT to be scheduled. She is also wanting to know if this will be performed at [facility] or [facility]. She states she does have [employer] [facility] and her member ID is [id number]. Please advise.

## 2020-05-05 LAB — COMPREHENSIVE METABOLIC PANEL
ALT: 19 IU/L (ref 0–32)
AST: 19 IU/L (ref 0–40)
Albumin/Globulin Ratio: 1.9 (ref 1.2–2.2)
Albumin: 4.5 g/dL (ref 3.8–4.9)
Alkaline Phosphatase: 108 IU/L (ref 44–121)
BUN/Creatinine Ratio: 16 (ref 9–23)
BUN: 9 mg/dL (ref 6–24)
Bilirubin Total: 0.5 mg/dL (ref 0.0–1.2)
CO2: 26 mmol/L (ref 20–29)
Calcium: 9.7 mg/dL (ref 8.7–10.2)
Chloride: 100 mmol/L (ref 96–106)
Creatinine, Ser: 0.56 mg/dL — ABNORMAL LOW (ref 0.57–1.00)
GFR calc Af Amer: 122 mL/min/{1.73_m2} (ref >59)
GFR calc non Af Amer: 106 mL/min/{1.73_m2} (ref >59)
Globulin, Total: 2.4 g/dL (ref 1.5–4.5)
Glucose: 100 mg/dL — ABNORMAL HIGH (ref 65–99)
Potassium: 4.5 mmol/L (ref 3.5–5.2)
Sodium: 142 mmol/L (ref 134–144)
Total Protein: 6.9 g/dL (ref 6.0–8.5)

## 2020-05-05 LAB — CBC WITH DIFFERENTIAL/PLATELET
Basophils Absolute: 0 10*3/uL (ref 0.0–0.2)
Basos: 1 %
EOS (ABSOLUTE): 0.1 10*3/uL (ref 0.0–0.4)
Eos: 2 %
Hematocrit: 41 % (ref 34.0–46.6)
Hemoglobin: 14.1 g/dL (ref 11.1–15.9)
Immature Grans (Abs): 0 10*3/uL (ref 0.0–0.1)
Immature Granulocytes: 0 %
Lymphocytes Absolute: 1.2 10*3/uL (ref 0.7–3.1)
Lymphs: 24 %
MCH: 32.1 pg (ref 26.6–33.0)
MCHC: 34.4 g/dL (ref 31.5–35.7)
MCV: 93 fL (ref 79–97)
Monocytes Absolute: 0.5 10*3/uL (ref 0.1–0.9)
Monocytes: 11 %
Neutrophils Absolute: 3 10*3/uL (ref 1.4–7.0)
Neutrophils: 62 %
Platelets: 306 10*3/uL (ref 150–450)
RBC: 4.39 x10E6/uL (ref 3.77–5.28)
RDW: 11.5 % — ABNORMAL LOW (ref 11.7–15.4)
WBC: 4.9 10*3/uL (ref 3.4–10.8)

## 2020-05-05 LAB — LIPID PANEL
Chol/HDL Ratio: 3 ratio (ref 0.0–4.4)
Cholesterol, Total: 317 mg/dL — ABNORMAL HIGH (ref 100–199)
HDL: 107 mg/dL (ref >39)
LDL Chol Calc (NIH): 199 mg/dL — ABNORMAL HIGH (ref 0–99)
Triglycerides: 74 mg/dL (ref 0–149)
VLDL Cholesterol Cal: 11 mg/dL (ref 5–40)

## 2020-05-05 LAB — CARDIOVASCULAR RISK ASSESSMENT

## 2020-05-05 LAB — TSH: TSH: 1.34 u[IU]/mL (ref 0.450–4.500)

## 2020-05-05 LAB — FERRITIN: Ferritin: 186 ng/mL — ABNORMAL HIGH (ref 15–150)

## 2020-05-06 ENCOUNTER — Other Ambulatory Visit: Payer: Self-pay | Admitting: Family Medicine

## 2020-05-06 DIAGNOSIS — R7989 Other specified abnormal findings of blood chemistry: Secondary | ICD-10-CM

## 2020-05-07 NOTE — Telephone Encounter (Signed)
Patient calling back.   °

## 2020-05-07 NOTE — Telephone Encounter (Signed)
Spoke to pt, apologized for the delay in getting her CT scheduled.  Made pt aware that I did not send message to precert and this lead to delay in getting her scheduled. She appreciates my calling to explain/inform.

## 2020-05-07 NOTE — Telephone Encounter (Signed)
[name], I believe I did forget to send precert staff message.  I have sent one just now and asked them to let me know where they are in scheduling patient so that we may update her.

## 2020-05-08 ENCOUNTER — Ambulatory Visit: Payer: Medicare Other | Admitting: Nurse Practitioner

## 2020-05-08 ENCOUNTER — Ambulatory Visit: Payer: BC Managed Care – PPO | Admitting: Physician Assistant

## 2020-05-08 ENCOUNTER — Other Ambulatory Visit (HOSPITAL_COMMUNITY): Payer: Self-pay | Admitting: Emergency Medicine

## 2020-05-08 DIAGNOSIS — I428 Other cardiomyopathies: Secondary | ICD-10-CM

## 2020-05-08 MED ORDER — IVABRADINE HCL 5 MG PO TABS: 10.0000 mg | ORAL_TABLET | Freq: Once | ORAL | 0 refills | Status: AC

## 2020-05-08 NOTE — Progress Notes (Signed)
Pt with allergies or intolerances to metoprolol tartrate and diltiazem as it relates to a cardiac CTA.   Requested to prescribe 10mg  ivabradine for heart rate control for upcoming test.  Verbal order from RN Navigator Cardiac Imaging [facility] [phone] Office  [phone] Cell

## 2020-05-09 ENCOUNTER — Telehealth (HOSPITAL_COMMUNITY): Payer: Self-pay | Admitting: Emergency Medicine

## 2020-05-09 NOTE — Telephone Encounter (Signed)
Pt returning phone call regarding upcoming cardiac imaging study; pt verbalizes understanding of appt date/time, parking situation and where to check in, pre-test NPO status and medications ordered, and verified current allergies; name and call back number provided for further questions should they arise Rockwell Alexandria RN Navigator Cardiac Imaging [facility] [phone] office [phone] cell   Pt informed to pick up corlanor from pharmacy on file and take 2 hr prior to scan. Requested ride  [employer]

## 2020-05-09 NOTE — Telephone Encounter (Signed)
Attempted to call patient regarding upcoming cardiac CT appointment. °Left message on voicemail with name and callback number °Falon Huesca RN Navigator Cardiac Imaging °[facility] °[phone] Office °[phone] Cell ° °

## 2020-05-11 LAB — HEMOCHROMATOSIS DNA-PCR(C282Y,H63D)

## 2020-05-11 LAB — SPECIMEN STATUS REPORT

## 2020-05-14 ENCOUNTER — Ambulatory Visit (HOSPITAL_COMMUNITY)
Admission: RE | Admit: 2020-05-14 | Discharge: 2020-05-14 | Disposition: A | Discharge status: Home / Self Care | Payer: Medicare Other | Source: Ambulatory Visit | Attending: Cardiology | Admitting: Cardiology

## 2020-05-14 ENCOUNTER — Encounter: Payer: Medicare Other | Admitting: *Deleted

## 2020-05-14 ENCOUNTER — Other Ambulatory Visit: Payer: Self-pay

## 2020-05-14 ENCOUNTER — Encounter (HOSPITAL_COMMUNITY): Payer: Self-pay

## 2020-05-14 DIAGNOSIS — R079 Chest pain, unspecified: Secondary | ICD-10-CM | POA: Insufficient documentation

## 2020-05-14 DIAGNOSIS — Z006 Encounter for examination for normal comparison and control in clinical research program: Secondary | ICD-10-CM

## 2020-05-14 DIAGNOSIS — Z148 Genetic carrier of other disease: Secondary | ICD-10-CM

## 2020-05-14 DIAGNOSIS — I700 Atherosclerosis of aorta: Secondary | ICD-10-CM | POA: Diagnosis not present

## 2020-05-14 MED ORDER — NITROGLYCERIN 0.4 MG SL SUBL
SUBLINGUAL_TABLET | SUBLINGUAL | Status: AC
  Filled 2020-05-14: qty 2

## 2020-05-14 MED ORDER — IOHEXOL 350 MG/ML SOLN
80.0000 mL | Freq: Once | INTRAVENOUS | Status: AC | PRN
  Administered 2020-05-14: 80 mL via INTRAVENOUS

## 2020-05-14 MED ORDER — NITROGLYCERIN 0.4 MG SL SUBL
0.8000 mg | SUBLINGUAL_TABLET | Freq: Once | SUBLINGUAL | Status: AC
  Administered 2020-05-14: 0.8 mg via SUBLINGUAL

## 2020-05-14 NOTE — Research (Signed)
Subject Name: [name]  Subject met inclusion and exclusion criteria.  The informed consent form, study requirements and expectations were reviewed with the subject and questions and concerns were addressed prior to the signing of the consent form.  The subject verbalized understanding of the trial requirements.  The subject agreed to participate in the IDENTIFY trial and signed the informed consent at 1424 on 05/14/20  The informed consent was obtained prior to performance of any protocol-specific procedures for the subject.  A copy of the signed informed consent was given to the subject and a copy was placed in the subject's medical record.   Young, Rachel C   

## 2020-05-17 ENCOUNTER — Telehealth: Payer: Self-pay | Admitting: Cardiology

## 2020-05-17 NOTE — Telephone Encounter (Signed)
Called patient. She said her call was misinterpreted. She was just wanting to ask if we could see her lungs. She has no further needs at this time.

## 2020-05-17 NOTE — Telephone Encounter (Signed)
Patient would like to schedule CT Scan to see if she has anymore blood clots

## 2020-05-17 NOTE — Telephone Encounter (Signed)
There is no point to repeat CT to check if there are any more blood clots, if she still short of breath she need to have an echocardiogram done to assess left ventricle ejection fraction more importantly pulmonary to pressure so, if she wants this and there is no recent echocardiogram echocardiogram need to be scheduled

## 2020-05-21 ENCOUNTER — Telehealth: Payer: Self-pay | Admitting: Cardiology

## 2020-05-21 NOTE — Telephone Encounter (Signed)
Called patient to confirm Wednesday's Echo and she had a few questions about her Iron levels, eloquis, being able to give blood and her blood pressure.  Please call to advise, thanks!

## 2020-05-21 NOTE — Telephone Encounter (Signed)
Called patient. She was told by her pcp that her iron was high. She wanted to give blood to help it come to normal. She was unable to do so since she is on eliquis. She said her blood pressure has been high since July after she had covid. Her most recent blood pressure is 159/92. Patient wants to know could her high iron level be causing blood pressure to be high also could her high iron level cause her cholesterol to be high? Will check with Dr. Bing Matter.

## 2020-05-22 NOTE — Telephone Encounter (Signed)
Patient informed of Dr. Krasowski's note below.  

## 2020-05-22 NOTE — Telephone Encounter (Signed)
No, high iron should not do elevation of the blood pressure however it should not elevate her cholesterol.

## 2020-05-23 ENCOUNTER — Other Ambulatory Visit: Payer: Self-pay

## 2020-05-23 ENCOUNTER — Ambulatory Visit (INDEPENDENT_AMBULATORY_CARE_PROVIDER_SITE_OTHER): Payer: Medicare Other

## 2020-05-23 DIAGNOSIS — I428 Other cardiomyopathies: Secondary | ICD-10-CM

## 2020-05-23 LAB — ECHOCARDIOGRAM COMPLETE
Area-P 1/2: 3.42 cm2
S' Lateral: 3 cm

## 2020-05-29 ENCOUNTER — Telehealth: Payer: Self-pay | Admitting: Oncology

## 2020-05-29 NOTE — Telephone Encounter (Signed)
Patient referred by Dr Blane Ohara for Hemochromatosis.  Appt made for 06/07/2020 Labs 10:30 am - Consult 11:00 am - Patient Notified

## 2020-06-06 ENCOUNTER — Other Ambulatory Visit: Payer: Self-pay | Admitting: Oncology

## 2020-06-07 ENCOUNTER — Inpatient Hospital Stay: Payer: Medicare Other | Admitting: Oncology

## 2020-06-07 ENCOUNTER — Encounter: Payer: Self-pay | Admitting: Oncology

## 2020-06-07 ENCOUNTER — Inpatient Hospital Stay: Payer: Medicare Other | Attending: Oncology

## 2020-06-07 ENCOUNTER — Other Ambulatory Visit: Payer: Self-pay

## 2020-06-07 ENCOUNTER — Other Ambulatory Visit: Payer: Self-pay | Admitting: Hematology and Oncology

## 2020-06-07 ENCOUNTER — Telehealth: Payer: Self-pay | Admitting: Oncology

## 2020-06-07 LAB — BASIC METABOLIC PANEL
BUN: 14 (ref 4–21)
CO2: 29 — AB (ref 13–22)
Chloride: 100 (ref 99–108)
Creatinine: 0.6 (ref 0.5–1.1)
Glucose: 87
Potassium: 3.2 — AB (ref 3.4–5.3)
Sodium: 137 (ref 137–147)

## 2020-06-07 LAB — IRON,TIBC AND FERRITIN PANEL
%SAT: 42.6
Ferritin: 143
Iron: 147
Iron: 147
TIBC: 345

## 2020-06-07 LAB — HEPATIC FUNCTION PANEL
ALT: 23 (ref 7–35)
AST: 32 (ref 13–35)
Alkaline Phosphatase: 123 (ref 25–125)
Bilirubin, Total: 0.7

## 2020-06-07 LAB — CBC AND DIFFERENTIAL
HCT: 40 (ref 36–46)
Hemoglobin: 14 (ref 12.0–16.0)
Neutrophils Absolute: 4.81
Platelets: 314 (ref 150–399)
WBC: 6.5

## 2020-06-07 LAB — COMPREHENSIVE METABOLIC PANEL
Albumin: 4.5 (ref 3.5–5.0)
Calcium: 9.2 (ref 8.7–10.7)

## 2020-06-07 LAB — CBC
MCV: 94 (ref 81–99)
RBC: 4.25 (ref 3.87–5.11)

## 2020-06-07 NOTE — Telephone Encounter (Signed)
No 12/23 LOS entered for next appt

## 2020-06-13 NOTE — Progress Notes (Signed)
[place]  [address] [place],  [place]  [place] [phone]  Clinic Day:  06/07/2020  Referring physician: Rochel Brome, MD   HISTORY OF PRESENT ILLNESS:  The patient is a 54 y.o. 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   Recent labs showed an elevated ferritin of 186.  According to the patient, she had her iron levels checked recently as she has felt poorly over the past few weeks.  The assumption was that her iron studies would show her to be iron deficient.  She is unaware of any family members having hemochromatosis.    PAST MEDICAL HISTORY:   Past Medical History:  Diagnosis Date  . Allergic reaction to bee sting 09/09/2018  . Anxiety   . Asthma   . Bacterial vaginosis 07/07/2019  . Bulging lumbar disc 07/22/2015  . Cardiomyopathy ([place]) 07/18/2015   Overview:  Overview:  Taka Tsubo 2009, normal coronaries  Last Assessment & Plan:  Relevant Hx: Course: Daily Update: Today's Plan: she has recently been seen by cards and is planned to have echo this Friday  Electronically signed by: Baldemar Friday, [place] 07/24/15 1042  . Carpal tunnel syndrome 07/22/2015  . Cervical strain 01/11/2019  . Chronic fatigue 07/22/2015  . Chronic rhinitis 07/22/2015  . Depression   . Dyslipidemia 07/18/2015  . Esophageal stricture 07/22/2015  . Essential hypertension   . Eustachian tube dysfunction 07/22/2015  . Excessive daytime sleepiness 05/16/2019  . Gastro-esophageal reflux disease without esophagitis   . Gastroesophageal reflux disease without esophagitis 07/18/2015   Last Assessment & Plan:  Relevant Hx: Course: Daily Update: Today's Plan:appears stable with ppi  Electronically signed by: Baldemar Friday, [place] 07/24/15 1043  . High risk medication use 07/22/2015  . History of blood clots   . Hot flash, menopausal 07/22/2015  . Hyperlipidemia 07/22/2015   Last Assessment & Plan:  Relevant Hx:  Course: Daily Update: Today's Plan:stable with meds  Electronically signed by: Baldemar Friday, [place] 07/24/15 1112  . Hypertension   . Localized swelling of both lower legs 07/22/2015  . Low back strain, initial encounter 07/07/2019  . Lung mass   . Mixed hyperlipidemia 07/22/2015   Last Assessment & Plan:  Formatting of this note might be different from the original. Relevant Hx: Course: Daily Update: Today's Plan:stable with meds  Electronically signed by: Baldemar Friday, [place] 07/24/15 1112  . Morbid obesity ([place]) 07/22/2015  . Other insomnia 11/22/2018  . Pneumonia due to COVID-19 virus 01/17/2020  . Pulmonary embolus ([place]) 01/18/2020  . Screening for osteoporosis 02/27/2017   Formatting of this note might be different from the original. 2019: spine 3.1, L femur 1.3  . Seasonal allergic rhinitis due to pollen 07/22/2015  . Snores 07/22/2015   Last Assessment & Plan:  Relevant Hx: Course: Daily Update: Today's Plan:she was to have psg, but has not had this yet. Discussed that if this is present could be worse post-op with pain meds. None the less, she will have protective airway with/during surgery. She should still have psg study, but wants to wait until after knee repair  Electronically signed by: Baldemar Friday, FNP 07/24/15    PAST SURGICAL HISTORY:   Past Surgical History:  Procedure Laterality Date  . ABDOMINAL HYSTERECTOMY    . CARPAL TUNNEL RELEASE Right   . CHOLECYSTECTOMY    . ECTOPIC PREGNANCY SURGERY     X 2  .  FOOT SURGERY    . Knee replcement    . POLYPECTOMY    . TONSILECTOMY/ADENOIDECTOMY WITH MYRINGOTOMY  1972  . TOTAL HIP ARTHROPLASTY Right   . WISDOM TOOTH EXTRACTION      CURRENT MEDICATIONS:   Current Outpatient Medications  Medication Sig Dispense Refill  . albuterol (VENTOLIN HFA) 108 (90 Base) MCG/ACT inhaler Inhale 1-2 puffs into the lungs every 6 (six) hours as needed for wheezing or shortness of breath.    Marland Kitchen amLODipine (NORVASC) 10 MG tablet  Take 1 tablet (10 mg total) by mouth daily. (Patient taking differently: Take 10 mg by mouth in the morning and at bedtime. ) 90 tablet 3  . amLODipine (NORVASC) 5 MG tablet Take 5 mg by mouth in the morning and at bedtime.    Marland Kitchen atorvastatin (LIPITOR) 10 MG tablet Take 1 tablet (10 mg total) by mouth daily. 90 tablet 1  . ELIQUIS 5 MG TABS tablet Take 5 mg by mouth 2 (two) times daily.     . fluticasone (FLONASE) 50 MCG/ACT nasal spray USE 2 SPRAYS IN EACH NOSTRIL ONCE DAILY    . furosemide (LASIX) 20 MG tablet TAKE 1 TABLET DAILY 90 tablet 0  . levocetirizine (XYZAL) 5 MG tablet Take 1 tablet by mouth daily.    Marland Kitchen LORazepam (ATIVAN) 0.5 MG tablet Take 1 tablet (0.5 mg total) by mouth daily as needed for anxiety (panic attacks). 30 tablet 0  . Multiple Vitamin (MULTIVITAMIN) tablet Take 1 tablet by mouth daily.      . nitroGLYCERIN (NITROSTAT) 0.4 MG SL tablet Place 1 tablet (0.4 mg total) under the tongue every 5 (five) minutes as needed for chest pain (x3 total. call ems if chest pain does not resolve after one, call ems.). 25 tablet 0  . omeprazole (PRILOSEC) 40 MG capsule Take 40 mg by mouth daily.      . ondansetron (ZOFRAN-ODT) 4 MG disintegrating tablet Take by mouth.    . sertraline (ZOLOFT) 100 MG tablet Take 1 tablet (100 mg total) by mouth daily. (Patient taking differently: Take 150 mg by mouth daily. ) 90 tablet 0   No current facility-administered medications for this visit.    ALLERGIES:   Allergies  Allergen Reactions  . Demerol Anaphylaxis  . Meperidine Anaphylaxis  . Sulfamethoxazole Itching    With redness to skin   . Citalopram Hydrobromide Other (See Comments)    Unknown  . Hydrocodone   . Hydrocodone-Acetaminophen Other (See Comments)    Unknown  . Metoprolol Tartrate Other (See Comments)    Chest discomfort, palpations  . Oxycodone-Acetaminophen Other (See Comments)    Unknown  . Diltiazem Rash  . Metoprolol Tartrate Palpitations    FAMILY HISTORY:    Family History  Problem Relation Age of Onset  . Heart attack Father   . Heart disease Father   . Dementia Mother   . Diabetes Half-Sister   . Leukemia Half-Brother   . Multiple myeloma Half-Brother     SOCIAL HISTORY:  The patient was born in [place].  She lives in [place].  She has no biological children.  She is currently on disability; she was a previous Freight forwarder at [employer].  She drinks a glass of wine daily.  She did smoke a pack of cigarettes daily for 5 years, but quit smoking nearly 5 years ago.    REVIEW OF SYSTEMS:  Review of Systems  Constitutional: Negative for fatigue and fever.  HENT:   Positive for tinnitus (right  ear). Negative for hearing loss and sore throat.   Eyes: Negative for eye problems.  Respiratory: Negative for chest tightness and cough.   Cardiovascular: Positive for palpitations. Negative for chest pain.  Gastrointestinal: Positive for vomiting (occasional). Negative for abdominal distention, abdominal pain, blood in stool, constipation, diarrhea and nausea.  Endocrine: Negative for hot flashes.  Genitourinary: Negative for difficulty urinating, dysuria, frequency, hematuria and nocturia.   Musculoskeletal: Positive for arthralgias. Negative for back pain, gait problem and myalgias.  Skin: Negative.  Negative for itching and rash.  Neurological: Negative.  Negative for dizziness, extremity weakness, gait problem, headaches, light-headedness and numbness.  Hematological: Negative.   Psychiatric/Behavioral: Positive for depression. Negative for suicidal ideas. The patient is nervous/anxious.      PHYSICAL EXAM:  Blood pressure (!) 175/99, pulse 77, temperature 98.6 F (37 C), resp. rate 16, height $RemoveBe'5\' 3"'evFCeiQEX$  (1.6 m), weight 225 lb 11.2 oz (102.4 kg), SpO2 98 %. Wt Readings from Last 3 Encounters:  06/07/20 225 lb 11.2 oz (102.4 kg)  05/04/20 227 lb (103 kg)  04/06/20 229 lb (103.9 kg)   Body mass index is 39.98 kg/m. Performance status  (ECOG): 1 - Symptomatic but completely ambulatory Physical Exam Constitutional:      Appearance: Normal appearance. She is not ill-appearing.  HENT:     Mouth/Throat:     Mouth: Mucous membranes are moist.     Pharynx: Oropharynx is clear. No oropharyngeal exudate or posterior oropharyngeal erythema.  Cardiovascular:     Rate and Rhythm: Normal rate and regular rhythm.     Heart sounds: No murmur heard. No friction rub. No gallop.   Pulmonary:     Effort: Pulmonary effort is normal. No respiratory distress.     Breath sounds: Normal breath sounds. No wheezing, rhonchi or rales.  Chest:  Breasts:     Right: No axillary adenopathy or supraclavicular adenopathy.     Left: No axillary adenopathy or supraclavicular adenopathy.    Abdominal:     General: Bowel sounds are normal. There is no distension.     Palpations: Abdomen is soft. There is no mass.     Tenderness: There is no abdominal tenderness.  Musculoskeletal:        General: No swelling.     Right lower leg: No edema.     Left lower leg: No edema.  Lymphadenopathy:     Cervical: No cervical adenopathy.     Upper Body:     Right upper body: No supraclavicular or axillary adenopathy.     Left upper body: No supraclavicular or axillary adenopathy.     Lower Body: No right inguinal adenopathy. No left inguinal adenopathy.  Skin:    General: Skin is warm.     Coloration: Skin is not jaundiced.     Findings: No lesion or rash.  Neurological:     General: No focal deficit present.     Mental Status: She is alert and oriented to person, place, and time. Mental status is at baseline.     Cranial Nerves: Cranial nerves are intact.  Psychiatric:        Mood and Affect: Mood normal.        Behavior: Behavior normal.        Thought Content: Thought content normal.    LABS:       ASSESSMENT & PLAN:  A 54 y.o. female who I was asked to consult upon for having hemochromatosis.  In clinic today, I clarified her  hemochromatosis status.  As she only has 1 abnormal mutation, she is a carrier for hemochromatosis.  Furthermore, she only has the H63D mutation, which is not the mutation associated with significant iron overload and possible organ damage.  This is usually seen with the C282Y mutation.  Overall, I do not anticipate her to have any problems with excessive iron overload during her lifetime.  I do feel comfortable turning her care back over to her primary care office.  My recommendation would be for her primary care office to check her iron parameters once per year, just to ensure her iron levels are not rising precipitously.  I would not have a problem seeing her in the future if new hematologic issues arise which require repeat clinical assessment.  The patient understands all the plans discussed today and is in agreement with them.  I do appreciate Cox, Kirsten, MD for his new consult.    Carlisle Torgeson Macarthur Critchley, MD

## 2020-06-14 ENCOUNTER — Ambulatory Visit: Payer: BC Managed Care – PPO | Admitting: Cardiology

## 2020-06-27 DIAGNOSIS — M47817 Spondylosis without myelopathy or radiculopathy, lumbosacral region: Secondary | ICD-10-CM | POA: Diagnosis not present

## 2020-06-27 DIAGNOSIS — M545 Low back pain, unspecified: Secondary | ICD-10-CM | POA: Diagnosis not present

## 2020-06-27 DIAGNOSIS — M5136 Other intervertebral disc degeneration, lumbar region: Secondary | ICD-10-CM | POA: Diagnosis not present

## 2020-07-13 ENCOUNTER — Ambulatory Visit: Payer: Self-pay | Admitting: Cardiology

## 2020-07-27 DIAGNOSIS — M47817 Spondylosis without myelopathy or radiculopathy, lumbosacral region: Secondary | ICD-10-CM | POA: Diagnosis not present

## 2020-07-27 DIAGNOSIS — M533 Sacrococcygeal disorders, not elsewhere classified: Secondary | ICD-10-CM | POA: Diagnosis not present

## 2020-07-27 DIAGNOSIS — M5136 Other intervertebral disc degeneration, lumbar region: Secondary | ICD-10-CM | POA: Diagnosis not present

## 2020-08-08 ENCOUNTER — Ambulatory Visit: Payer: Medicare Other | Admitting: Family Medicine

## 2020-08-15 ENCOUNTER — Other Ambulatory Visit: Payer: Self-pay | Admitting: Physician Assistant

## 2020-08-15 DIAGNOSIS — R1310 Dysphagia, unspecified: Secondary | ICD-10-CM | POA: Diagnosis not present

## 2020-08-21 NOTE — Progress Notes (Signed)
Subjective:  Patient ID: [name], female    DOB: 03/05/1966  Age: 55 y.o. MRN: [id number]  Chief Complaint  Patient presents with  . Hyperlipidemia  . Hypertension    HPI Essential hypertension Taking amlodipine 10 mg once daily.  Mixed hyperlipidemia Takes lipitor 40 mg once daily and aspirin 81 mg daily Eating healthy.   GAD (generalized anxiety disorder) Not Controlled with ativan and zoloft. Increased anxiety. Not depressed.  Having palpitations (Due to stress, Panic attacks: once a week.).  Gastroesophageal reflux disease without esophagitis Taking prilosec. Uncontrolled.    Current Outpatient Medications on File Prior to Visit  Medication Sig Dispense Refill  . aspirin EC 81 MG tablet Take 81 mg by mouth daily. Swallow whole.    . esomeprazole (NEXIUM) 20 MG capsule Take 20 mg by mouth daily at 12 noon.    Marland Kitchen albuterol (VENTOLIN HFA) 108 (90 Base) MCG/ACT inhaler Inhale 1-2 puffs into the lungs every 6 (six) hours as needed for wheezing or shortness of breath.    . fluticasone (FLONASE) 50 MCG/ACT nasal spray USE 2 SPRAYS IN EACH NOSTRIL ONCE DAILY    . furosemide (LASIX) 20 MG tablet TAKE 1 TABLET DAILY 90 tablet 0  . levocetirizine (XYZAL) 5 MG tablet Take 1 tablet by mouth daily.    . Multiple Vitamin (MULTIVITAMIN) tablet Take 1 tablet by mouth daily.      . nitroGLYCERIN (NITROSTAT) 0.4 MG SL tablet Place 1 tablet (0.4 mg total) under the tongue every 5 (five) minutes as needed for chest pain (x3 total. call ems if chest pain does not resolve after one, call ems.). 25 tablet 0  . ondansetron (ZOFRAN-ODT) 4 MG disintegrating tablet Take by mouth.     No current facility-administered medications on file prior to visit.   Past Medical History:  Diagnosis Date  . Allergic reaction to bee sting 09/09/2018  . Anxiety   . Asthma   . Bacterial vaginosis 07/07/2019  . Bulging lumbar disc 07/22/2015  . Cardiomyopathy ([place]) 07/18/2015   Overview:  Overview:  Taka Tsubo  2009, normal coronaries  Last Assessment & Plan:  Relevant Hx: Course: Daily Update: Today's Plan: she has recently been seen by cards and is planned to have echo this Friday  Electronically signed by: Baldemar Friday, [place] 07/24/15 1042  . Carpal tunnel syndrome 07/22/2015  . Cervical strain 01/11/2019  . Chronic fatigue 07/22/2015  . Chronic rhinitis 07/22/2015  . Depression   . Dyslipidemia 07/18/2015  . Esophageal stricture 07/22/2015  . Essential hypertension   . Eustachian tube dysfunction 07/22/2015  . Excessive daytime sleepiness 05/16/2019  . Gastro-esophageal reflux disease without esophagitis   . Gastroesophageal reflux disease without esophagitis 07/18/2015   Last Assessment & Plan:  Relevant Hx: Course: Daily Update: Today's Plan:appears stable with ppi  Electronically signed by: Baldemar Friday, [place] 07/24/15 1043  . High risk medication use 07/22/2015  . History of blood clots   . Hot flash, menopausal 07/22/2015  . Hyperlipidemia 07/22/2015   Last Assessment & Plan:  Relevant Hx: Course: Daily Update: Today's Plan:stable with meds  Electronically signed by: Baldemar Friday, [place] 07/24/15 1112  . Hypertension   . Localized swelling of both lower legs 07/22/2015  . Low back strain, initial encounter 07/07/2019  . Lung mass   . Mixed hyperlipidemia 07/22/2015   Last Assessment & Plan:  Formatting of this note might be different from the original. Relevant Hx: Course: Daily Update: Today's Plan:stable with meds  Electronically signed by: Baldemar Friday, [place] 07/24/15 1112  . Morbid obesity ([place]) 07/22/2015  . Other insomnia 11/22/2018  . Pneumonia due to COVID-19 virus 01/17/2020  . Pulmonary embolus ([place]) 01/18/2020  . Screening for osteoporosis 02/27/2017   Formatting of this note might be different from the original. 2019: spine 3.1, L femur 1.3  . Seasonal allergic rhinitis due to pollen 07/22/2015  . Snores 07/22/2015   Last Assessment & Plan:  Relevant Hx: Course: Daily  Update: Today's Plan:she was to have psg, but has not had this yet. Discussed that if this is present could be worse post-op with pain meds. None the less, she will have protective airway with/during surgery. She should still have psg study, but wants to wait until after knee repair  Electronically signed by: Baldemar Friday, FNP 07/24/15   Past Surgical History:  Procedure Laterality Date  . ABDOMINAL HYSTERECTOMY    . CARPAL TUNNEL RELEASE Right   . CHOLECYSTECTOMY    . ECTOPIC PREGNANCY SURGERY     X 2  . FOOT SURGERY    . Knee replcement    . POLYPECTOMY    . TONSILECTOMY/ADENOIDECTOMY WITH MYRINGOTOMY  1972  . TOTAL HIP ARTHROPLASTY Right   . WISDOM TOOTH EXTRACTION      Family History  Problem Relation Age of Onset  . Heart attack Father   . Heart disease Father   . Dementia Mother   . Diabetes Half-Sister   . Leukemia Half-Brother   . Multiple myeloma Half-Brother    Social History   Socioeconomic History  . Marital status: Divorced    Spouse name: NONE  . Number of children: 0  . Years of education: [place]  . Highest education level: Not on file  Occupational History  . Occupation: DIABLED  Tobacco Use  . Smoking status: Former Smoker    Types: Cigarettes    Quit date: 06/16/2006    Years since quitting: 14.2  . Smokeless tobacco: Never Used  Vaping Use  . Vaping Use: Never used  Substance and Sexual Activity  . Alcohol use: Yes    Alcohol/week: 3.0 - 4.0 standard drinks    Types: 3 - 4 Cans of beer per week  . Drug use: No  . Sexual activity: Yes  Other Topics Concern  . Not on file  Social History Narrative  . Not on file   Social Determinants of Health   Financial Resource Strain: Not on file  Food Insecurity: Not on file  Transportation Needs: Not on file  Physical Activity: Not on file  Stress: Not on file  Social Connections: Not on file    Review of Systems  Constitutional: Negative for chills, fatigue and fever.   HENT: Negative for congestion, ear pain, rhinorrhea and sore throat.   Respiratory: Negative for cough and shortness of breath.   Cardiovascular: Positive for palpitations (Due to stress, Panic attacks: once a week.). Negative for chest pain.  Gastrointestinal: Positive for nausea (6-8 x a month) and vomiting. Negative for abdominal pain, constipation and diarrhea.  Genitourinary: Negative for dysuria and urgency.  Musculoskeletal: Positive for back pain (ESI early 07/2020). Negative for myalgias.  Neurological: Positive for headaches. Negative for dizziness, weakness and light-headedness.  Psychiatric/Behavioral: Negative for dysphoric mood. The patient is not nervous/anxious.      Objective:  BP 110/70   Pulse 74   Temp (!) 97.4 F (36.3 C)   Ht 5' 2" (1.575 m)   Wt  224 lb (101.6 kg)   SpO2 99%   BMI 40.97 kg/m   BP/Weight 08/22/2020 06/07/2020 03/54/6568  Systolic BP 127 517 001  Diastolic BP 70 99 97  Wt. (Lbs) 224 225.7 -  BMI 40.97 39.98 -    Physical Exam Vitals reviewed.  Constitutional:      Appearance: Normal appearance. She is normal weight.  Neck:     Vascular: No carotid bruit.  Cardiovascular:     Rate and Rhythm: Normal rate and regular rhythm.     Pulses: Normal pulses.     Heart sounds: Normal heart sounds.  Pulmonary:     Effort: Pulmonary effort is normal. No respiratory distress.     Breath sounds: Normal breath sounds.  Abdominal:     General: Abdomen is flat. Bowel sounds are normal.     Palpations: Abdomen is soft.     Tenderness: There is no abdominal tenderness.  Neurological:     Mental Status: She is alert and oriented to person, place, and time.  Psychiatric:        Mood and Affect: Mood normal.        Behavior: Behavior normal.     Diabetic Foot Exam - Simple   No data filed      Lab Results  Component Value Date   WBC 4.4 08/22/2020   HGB 14.1 08/22/2020   HCT 41.2 08/22/2020   PLT 259 08/22/2020   GLUCOSE 94 08/22/2020    CHOL 325 (H) 08/22/2020   TRIG 59 08/22/2020   HDL 131 08/22/2020   LDLCALC 186 (H) 08/22/2020   ALT 20 08/22/2020   AST 18 08/22/2020   NA 141 08/22/2020   K 4.7 08/22/2020   CL 101 08/22/2020   CREATININE 0.65 08/22/2020   BUN 12 08/22/2020   CO2 24 08/22/2020   TSH 1.340 05/04/2020      Assessment & Plan:   1. Essential hypertension Well controlled.  No changes to medicines.  Continue to work on eating a healthy diet and exercise.  Labs drawn today.  - Comprehensive metabolic panel - amLODipine (NORVASC) 10 MG tablet; Take 1 tablet (10 mg total) by mouth daily.  Dispense: 90 tablet; Refill: 1  2. Mixed hyperlipidemia Poorly controlled.  Increase lipitor to 40 mg once daily.  Continue to work on eating a healthy diet and exercise.  Labs drawn today.  - Lipid panel - CBC with Differential/Platelet  3. GAD (generalized anxiety disorder) [facility]: [phone]. [facility]: [phone] May increase zoloft to 100 mg 2 daily.  Lorazepam refilled.  - sertraline (ZOLOFT) 100 MG tablet; Take 2 tablets (200 mg total) by mouth daily.  Dispense: 180 tablet; Refill: 1 - LORazepam (ATIVAN) 0.5 MG tablet; Take 1 tablet (0.5 mg total) by mouth daily as needed for anxiety (panic attacks).  Dispense: 30 tablet; Refill: 1  4. Gastroesophageal reflux disease without esophagitis Change omeprazole to nexium.  5. Pre-operative clearance - Novel Coronavirus, NAA ([facility]) negative  6. Depression, major, recurrent, moderate ([place]) [facility]: [phone]. [place]: [phone] May increase zoloft to 100 mg 2 daily.  Lorazepam refilled.  - sertraline (ZOLOFT) 100 MG tablet; Take 2 tablets (200 mg total) by mouth daily.  Dispense: 180 tablet; Refill: 1 - LORazepam (ATIVAN) 0.5 MG tablet; Take 1 tablet (0.5 mg total) by mouth daily as needed for anxiety (panic attacks).  Dispense: 30 tablet; Refill: 1    Meds ordered this encounter   Medications  . amLODipine (NORVASC) 10 MG  tablet    Sig: Take 1 tablet (10 mg total) by mouth daily.    Dispense:  90 tablet    Refill:  1  . sertraline (ZOLOFT) 100 MG tablet    Sig: Take 2 tablets (200 mg total) by mouth daily.    Dispense:  180 tablet    Refill:  1  . LORazepam (ATIVAN) 0.5 MG tablet    Sig: Take 1 tablet (0.5 mg total) by mouth daily as needed for anxiety (panic attacks).    Dispense:  30 tablet    Refill:  1    Orders Placed This Encounter  Procedures  . Novel Coronavirus, NAA ([facility])  . SARS-COV-2, NAA 2 DAY TAT  . Lipid panel  . CBC with Differential/Platelet  . Comprehensive metabolic panel  . Cardiovascular Risk Assessment     Follow-up: Return in about 3 months (around 11/22/2020) for fasting.  An After Visit Summary was printed and given to the patient.  Rochel Brome, MD [facility] [phone]

## 2020-08-22 ENCOUNTER — Other Ambulatory Visit: Payer: Self-pay

## 2020-08-22 ENCOUNTER — Encounter: Payer: Self-pay | Admitting: Family Medicine

## 2020-08-22 ENCOUNTER — Ambulatory Visit (INDEPENDENT_AMBULATORY_CARE_PROVIDER_SITE_OTHER): Payer: Medicare HMO | Admitting: Family Medicine

## 2020-08-22 VITALS — BP 110/70 | HR 74 | Temp 97.4°F | Ht 62.0 in | Wt 224.0 lb

## 2020-08-22 DIAGNOSIS — F411 Generalized anxiety disorder: Secondary | ICD-10-CM | POA: Diagnosis not present

## 2020-08-22 DIAGNOSIS — K219 Gastro-esophageal reflux disease without esophagitis: Secondary | ICD-10-CM | POA: Diagnosis not present

## 2020-08-22 DIAGNOSIS — R69 Illness, unspecified: Secondary | ICD-10-CM | POA: Diagnosis not present

## 2020-08-22 DIAGNOSIS — I10 Essential (primary) hypertension: Secondary | ICD-10-CM

## 2020-08-22 DIAGNOSIS — Z01818 Encounter for other preprocedural examination: Secondary | ICD-10-CM | POA: Diagnosis not present

## 2020-08-22 DIAGNOSIS — F331 Major depressive disorder, recurrent, moderate: Secondary | ICD-10-CM

## 2020-08-22 DIAGNOSIS — E782 Mixed hyperlipidemia: Secondary | ICD-10-CM | POA: Diagnosis not present

## 2020-08-22 MED ORDER — SERTRALINE HCL 100 MG PO TABS: 200.0000 mg | ORAL_TABLET | Freq: Every day | ORAL | 1 refills | Status: DC

## 2020-08-22 MED ORDER — LORAZEPAM 0.5 MG PO TABS: 0.5000 mg | ORAL_TABLET | Freq: Every day | ORAL | 1 refills | Status: DC | PRN

## 2020-08-22 MED ORDER — AMLODIPINE BESYLATE 10 MG PO TABS: 10.0000 mg | ORAL_TABLET | Freq: Every day | ORAL | 1 refills | Status: DC

## 2020-08-22 NOTE — Patient Instructions (Signed)
[facility]: [phone]. [facility]: [phone]  May increase zoloft to 100 mg 2 daily.  Lorazepam refilled.

## 2020-08-23 LAB — CBC WITH DIFFERENTIAL/PLATELET
Basophils Absolute: 0 10*3/uL (ref 0.0–0.2)
Basos: 1 %
EOS (ABSOLUTE): 0 10*3/uL (ref 0.0–0.4)
Eos: 1 %
Hematocrit: 41.2 % (ref 34.0–46.6)
Hemoglobin: 14.1 g/dL (ref 11.1–15.9)
Immature Grans (Abs): 0 10*3/uL (ref 0.0–0.1)
Immature Granulocytes: 0 %
Lymphocytes Absolute: 1.2 10*3/uL (ref 0.7–3.1)
Lymphs: 28 %
MCH: 32.7 pg (ref 26.6–33.0)
MCHC: 34.2 g/dL (ref 31.5–35.7)
MCV: 96 fL (ref 79–97)
Monocytes Absolute: 0.5 10*3/uL (ref 0.1–0.9)
Monocytes: 11 %
Neutrophils Absolute: 2.6 10*3/uL (ref 1.4–7.0)
Neutrophils: 59 %
Platelets: 259 10*3/uL (ref 150–450)
RBC: 4.31 x10E6/uL (ref 3.77–5.28)
RDW: 12.9 % (ref 11.7–15.4)
WBC: 4.4 10*3/uL (ref 3.4–10.8)

## 2020-08-23 LAB — LIPID PANEL
Chol/HDL Ratio: 2.5 ratio (ref 0.0–4.4)
Cholesterol, Total: 325 mg/dL — ABNORMAL HIGH (ref 100–199)
HDL: 131 mg/dL (ref >39)
LDL Chol Calc (NIH): 186 mg/dL — ABNORMAL HIGH (ref 0–99)
Triglycerides: 59 mg/dL (ref 0–149)
VLDL Cholesterol Cal: 8 mg/dL (ref 5–40)

## 2020-08-23 LAB — COMPREHENSIVE METABOLIC PANEL
ALT: 20 IU/L (ref 0–32)
AST: 18 IU/L (ref 0–40)
Albumin/Globulin Ratio: 1.8 (ref 1.2–2.2)
Albumin: 4.3 g/dL (ref 3.8–4.9)
Alkaline Phosphatase: 98 IU/L (ref 44–121)
BUN/Creatinine Ratio: 18 (ref 9–23)
BUN: 12 mg/dL (ref 6–24)
Bilirubin Total: 0.4 mg/dL (ref 0.0–1.2)
CO2: 24 mmol/L (ref 20–29)
Calcium: 9 mg/dL (ref 8.7–10.2)
Chloride: 101 mmol/L (ref 96–106)
Creatinine, Ser: 0.65 mg/dL (ref 0.57–1.00)
Globulin, Total: 2.4 g/dL (ref 1.5–4.5)
Glucose: 94 mg/dL (ref 65–99)
Potassium: 4.7 mmol/L (ref 3.5–5.2)
Sodium: 141 mmol/L (ref 134–144)
Total Protein: 6.7 g/dL (ref 6.0–8.5)
eGFR: 105 mL/min/{1.73_m2} (ref >59)

## 2020-08-23 LAB — CARDIOVASCULAR RISK ASSESSMENT

## 2020-08-23 LAB — NOVEL CORONAVIRUS, NAA: SARS-CoV-2, NAA: NOT DETECTED

## 2020-08-24 ENCOUNTER — Other Ambulatory Visit: Payer: Self-pay

## 2020-08-24 MED ORDER — ATORVASTATIN CALCIUM 40 MG PO TABS
40.0000 mg | ORAL_TABLET | Freq: Every day | ORAL | 0 refills | Status: DC
Start: 2020-08-24 — End: 2020-10-08

## 2020-08-28 ENCOUNTER — Encounter: Payer: Self-pay | Admitting: Family Medicine

## 2020-08-28 DIAGNOSIS — I252 Old myocardial infarction: Secondary | ICD-10-CM | POA: Diagnosis not present

## 2020-08-28 DIAGNOSIS — R69 Illness, unspecified: Secondary | ICD-10-CM | POA: Diagnosis not present

## 2020-08-28 DIAGNOSIS — I10 Essential (primary) hypertension: Secondary | ICD-10-CM | POA: Diagnosis not present

## 2020-08-28 DIAGNOSIS — Z87891 Personal history of nicotine dependence: Secondary | ICD-10-CM | POA: Diagnosis not present

## 2020-08-28 DIAGNOSIS — K209 Esophagitis, unspecified without bleeding: Secondary | ICD-10-CM | POA: Diagnosis not present

## 2020-08-28 DIAGNOSIS — K222 Esophageal obstruction: Secondary | ICD-10-CM | POA: Diagnosis not present

## 2020-08-28 DIAGNOSIS — K219 Gastro-esophageal reflux disease without esophagitis: Secondary | ICD-10-CM | POA: Diagnosis not present

## 2020-08-28 DIAGNOSIS — J45909 Unspecified asthma, uncomplicated: Secondary | ICD-10-CM | POA: Diagnosis not present

## 2020-08-28 DIAGNOSIS — R1310 Dysphagia, unspecified: Secondary | ICD-10-CM | POA: Diagnosis not present

## 2020-08-28 DIAGNOSIS — K449 Diaphragmatic hernia without obstruction or gangrene: Secondary | ICD-10-CM | POA: Diagnosis not present

## 2020-09-09 ENCOUNTER — Encounter: Payer: Self-pay | Admitting: Family Medicine

## 2020-09-12 ENCOUNTER — Ambulatory Visit: Payer: Medicare HMO | Admitting: Cardiology

## 2020-09-20 ENCOUNTER — Telehealth: Payer: Self-pay | Admitting: *Deleted

## 2020-09-20 DIAGNOSIS — Z006 Encounter for examination for normal comparison and control in clinical research program: Secondary | ICD-10-CM

## 2020-09-20 NOTE — Telephone Encounter (Signed)
I called patient for 90-day Identify Study. Patient states still having chest pain on and off.I reminded patient I would call her in November for 1-year follow-up.

## 2020-10-01 ENCOUNTER — Other Ambulatory Visit: Payer: Self-pay | Admitting: Cardiology

## 2020-10-01 DIAGNOSIS — R079 Chest pain, unspecified: Secondary | ICD-10-CM

## 2020-10-03 DIAGNOSIS — K219 Gastro-esophageal reflux disease without esophagitis: Secondary | ICD-10-CM | POA: Diagnosis not present

## 2020-10-03 DIAGNOSIS — K5730 Diverticulosis of large intestine without perforation or abscess without bleeding: Secondary | ICD-10-CM | POA: Diagnosis not present

## 2020-10-08 ENCOUNTER — Encounter: Payer: Self-pay | Admitting: Cardiology

## 2020-10-08 ENCOUNTER — Other Ambulatory Visit: Payer: Self-pay

## 2020-10-08 ENCOUNTER — Ambulatory Visit: Payer: Medicare HMO | Admitting: Cardiology

## 2020-10-08 VITALS — BP 134/72 | HR 88 | Ht 63.0 in | Wt 222.0 lb

## 2020-10-08 DIAGNOSIS — E785 Hyperlipidemia, unspecified: Secondary | ICD-10-CM | POA: Diagnosis not present

## 2020-10-08 DIAGNOSIS — I428 Other cardiomyopathies: Secondary | ICD-10-CM | POA: Diagnosis not present

## 2020-10-08 DIAGNOSIS — I10 Essential (primary) hypertension: Secondary | ICD-10-CM | POA: Diagnosis not present

## 2020-10-08 DIAGNOSIS — Z86718 Personal history of other venous thrombosis and embolism: Secondary | ICD-10-CM | POA: Diagnosis not present

## 2020-10-08 NOTE — Patient Instructions (Signed)

## 2020-10-08 NOTE — Progress Notes (Signed)
Cardiology Office Note:    Date:  10/08/2020   ID:  BRINAE WOODS, DOB February 28, 1966, MRN [id number]  PCP:  Rochel Brome, MD  Cardiologist:  Jenne Campus, MD    Referring MD: Marge Duncans, PA-C   Chief Complaint  Patient presents with  . Follow-up  Am doing fine  History of Present Illness:    [name] is a 55 y.o. female with past medical history significant for cardiomyopathy likely normalization with last estimation of left ventricle ejection fraction December showing ejection fraction 6065%, cardiac catheterization done few years ago showing only nonobstructive disease, coronary CT angio done last year showed No significant coronary disease, essential hypertension, dyslipidemia. She comes today to my office for follow-up overall doing well denies have any chest pain tightness squeezing pressure burning chest.  She is very active and doing a lot of stuff have no difficulty doing it.  Past Medical History:  Diagnosis Date  . Allergic reaction to bee sting 09/09/2018  . Anxiety   . Asthma   . Bacterial vaginosis 07/07/2019  . Bulging lumbar disc 07/22/2015  . Cardiomyopathy ([place]) 07/18/2015   Overview:  Overview:  [place] 2009, normal coronaries  Last Assessment & Plan:  Relevant Hx: Course: Daily Update: Today's Plan: she has recently been seen by cards and is planned to have echo this Friday  Electronically signed by: Baldemar Friday, [place] 07/24/15 1042  . Carpal tunnel syndrome 07/22/2015  . Cervical strain 01/11/2019  . Chronic fatigue 07/22/2015  . Chronic rhinitis 07/22/2015  . Depression   . Dyslipidemia 07/18/2015  . Esophageal stricture 07/22/2015  . Essential hypertension   . Eustachian tube dysfunction 07/22/2015  . Excessive daytime sleepiness 05/16/2019  . Gastro-esophageal reflux disease without esophagitis   . Gastroesophageal reflux disease without esophagitis 07/18/2015   Last Assessment & Plan:  Relevant Hx: Course: Daily Update: Today's Plan:appears stable  with ppi  Electronically signed by: Baldemar Friday, [place] 07/24/15 1043  . High risk medication use 07/22/2015  . History of blood clots   . Hot flash, menopausal 07/22/2015  . Hyperlipidemia 07/22/2015   Last Assessment & Plan:  Relevant Hx: Course: Daily Update: Today's Plan:stable with meds  Electronically signed by: Baldemar Friday, [place] 07/24/15 1112  . Hypertension   . Localized swelling of both lower legs 07/22/2015  . Localized swelling of both lower legs 07/22/2015  . Low back strain, initial encounter 07/07/2019  . Lung mass   . Mixed hyperlipidemia 07/22/2015   Last Assessment & Plan:  Formatting of this note might be different from the original. Relevant Hx: Course: Daily Update: Today's Plan:stable with meds  Electronically signed by: Baldemar Friday, [place] 07/24/15 1112  . Morbid obesity ([place]) 07/22/2015  . Other insomnia 11/22/2018  . Pneumonia due to COVID-19 virus 01/17/2020  . Pulmonary embolus ([place]) 01/18/2020  . Screening for osteoporosis 02/27/2017   Formatting of this note might be different from the original. 2019: spine 3.1, L femur 1.3  . Seasonal allergic rhinitis due to pollen 07/22/2015  . Snores 07/22/2015   Last Assessment & Plan:  Relevant Hx: Course: Daily Update: Today's Plan:she was to have psg, but has not had this yet. Discussed that if this is present could be worse post-op with pain meds. None the less, she will have protective airway with/during surgery. She should still have psg study, but wants to wait until after knee repair  Electronically signed by: Baldemar Friday, FNP 07/24/15    Past Surgical  History:  Procedure Laterality Date  . ABDOMINAL HYSTERECTOMY    . CARPAL TUNNEL RELEASE Right   . CHOLECYSTECTOMY    . ECTOPIC PREGNANCY SURGERY     X 2  . FOOT SURGERY    . Knee replcement    . POLYPECTOMY    . TONSILECTOMY/ADENOIDECTOMY WITH MYRINGOTOMY  1972  . TOTAL HIP ARTHROPLASTY Right   . WISDOM TOOTH EXTRACTION      Current  Medications: Current Meds  Medication Sig  . albuterol (VENTOLIN HFA) 108 (90 Base) MCG/ACT inhaler Inhale 1-2 puffs into the lungs every 6 (six) hours as needed for wheezing or shortness of breath.  Marland Kitchen amLODipine (NORVASC) 10 MG tablet Take 1 tablet (10 mg total) by mouth daily.  Marland Kitchen aspirin EC 81 MG tablet Take 81 mg by mouth daily. Swallow whole.  . esomeprazole (NEXIUM) 20 MG capsule Take 20 mg by mouth daily at 12 noon.  . fluticasone (FLONASE) 50 MCG/ACT nasal spray Place 1 spray into both nostrils daily.  . furosemide (LASIX) 20 MG tablet TAKE 1 TABLET DAILY (Patient taking differently: Take 20 mg by mouth as needed for fluid or edema.)  . LORazepam (ATIVAN) 0.5 MG tablet Take 1 tablet (0.5 mg total) by mouth daily as needed for anxiety (panic attacks).  . Multiple Vitamin (MULTIVITAMIN) tablet Take 1 tablet by mouth daily.  . nitroGLYCERIN (NITROSTAT) 0.4 MG SL tablet Place 1 tablet (0.4 mg total) under the tongue every 5 (five) minutes as needed for chest pain (x3 total. call ems if chest pain does not resolve after one, call ems.).  Marland Kitchen ondansetron (ZOFRAN-ODT) 4 MG disintegrating tablet Take 4 mg by mouth as needed for nausea or vomiting.  . sertraline (ZOLOFT) 100 MG tablet Take 2 tablets (200 mg total) by mouth daily.  . [DISCONTINUED] atorvastatin (LIPITOR) 40 MG tablet Take 1 tablet (40 mg total) by mouth daily.  . [DISCONTINUED] levocetirizine (XYZAL) 5 MG tablet Take 1 tablet by mouth daily.     Allergies:   Demerol, Meperidine, Sulfamethoxazole, Citalopram hydrobromide, Hydrocodone, Hydrocodone-acetaminophen, Metoprolol tartrate, Oxycodone-acetaminophen, Diltiazem, and Metoprolol tartrate   Social History   Socioeconomic History  . Marital status: Divorced    Spouse name: NONE  . Number of children: 0  . Years of education: [place]  . Highest education level: Not on file  Occupational History  . Occupation: DIABLED  Tobacco Use  . Smoking status: Former Smoker     Types: Cigarettes    Quit date: 06/16/2006    Years since quitting: 14.3  . Smokeless tobacco: Never Used  Vaping Use  . Vaping Use: Never used  Substance and Sexual Activity  . Alcohol use: Yes    Alcohol/week: 3.0 - 4.0 standard drinks    Types: 3 - 4 Cans of beer per week  . Drug use: No  . Sexual activity: Yes  Other Topics Concern  . Not on file  Social History Narrative  . Not on file   Social Determinants of Health   Financial Resource Strain: Not on file  Food Insecurity: Not on file  Transportation Needs: Not on file  Physical Activity: Not on file  Stress: Not on file  Social Connections: Not on file     Family History: The patient's family history includes Dementia in her mother; Diabetes in her half-sister; Heart attack in her father; Heart disease in her father; Leukemia in her half-brother; Multiple myeloma in her half-brother. ROS:   Please see the history of  present illness.    All 14 point review of systems negative except as described per history of present illness  EKGs/Labs/Other Studies Reviewed:      Recent Labs: 05/04/2020: TSH 1.340 08/22/2020: ALT 20; BUN 12; Creatinine, Ser 0.65; Hemoglobin 14.1; Platelets 259; Potassium 4.7; Sodium 141  Recent Lipid Panel    Component Value Date/Time   CHOL 325 (H) 08/22/2020 1038   TRIG 59 08/22/2020 1038   HDL 131 08/22/2020 1038   CHOLHDL 2.5 08/22/2020 1038   LDLCALC 186 (H) 08/22/2020 1038    Physical Exam:    VS:  BP 134/72 (BP Location: Right Arm, Patient Position: Sitting)   Pulse 88   Ht _0  (1.6 m)   Wt 222 lb (100.7 kg)   SpO2 93%   BMI 39.33 kg/m     Wt Readings from Last 3 Encounters:  10/08/20 222 lb (100.7 kg)  08/22/20 224 lb (101.6 kg)  06/07/20 225 lb 11.2 oz (102.4 kg)     GEN:  Well nourished, well developed in no acute distress HEENT: Normal NECK: No JVD; No carotid bruits LYMPHATICS: No lymphadenopathy CARDIAC: RRR, no murmurs, no rubs, no gallops RESPIRATORY:   Clear to auscultation without rales, wheezing or rhonchi  ABDOMEN: Soft, non-tender, non-distended MUSCULOSKELETAL:  No edema; No deformity  SKIN: Warm and dry LOWER EXTREMITIES: no swelling NEUROLOGIC:  Alert and oriented x 3 PSYCHIATRIC:  Normal affect   ASSESSMENT:    1. Essential hypertension   2. Other cardiomyopathy ([place])   3. History of blood clots   4. Dyslipidemia    PLAN:    In order of problems listed above:  1. History of cardiomyopathy and normalization last echocardiogram reviewed with the patient normal ejection fraction. 2. Possibility of coronary artery disease coronary CT angio showing no significant blockages. 3. History of PE in face of COVID.  Doing well from that point review continue present medications. 4. Dyslipidemia I did review her K PN which show me her LDL of 186 and HDL 131, strange numbers this is from August 22, 2020.  She was unable to tolerate Lipitor she was put on different medication by her primary care physician.  She will have fasting lipid profile redone. 5. Overall we talked about healthy lifestyle and she understands she need to be L be more active and watch what she is eating.   Medication Adjustments/Labs and Tests Ordered: Current medicines are reviewed at length with the patient today.  Concerns regarding medicines are outlined above.  No orders of the defined types were placed in this encounter.  Medication changes: No orders of the defined types were placed in this encounter.   Signed, Park Liter, MD, [facility] 10/08/2020 11:00 AM    [place]

## 2020-10-09 DIAGNOSIS — M7541 Impingement syndrome of right shoulder: Secondary | ICD-10-CM | POA: Diagnosis not present

## 2020-10-09 DIAGNOSIS — M25511 Pain in right shoulder: Secondary | ICD-10-CM | POA: Diagnosis not present

## 2020-11-02 ENCOUNTER — Encounter: Payer: Self-pay | Admitting: Family Medicine

## 2020-11-02 ENCOUNTER — Telehealth (INDEPENDENT_AMBULATORY_CARE_PROVIDER_SITE_OTHER): Payer: Medicare HMO | Admitting: Family Medicine

## 2020-11-02 VITALS — BP 118/82 | HR 81 | Temp 97.1°F | Ht 63.0 in | Wt 219.0 lb

## 2020-11-02 DIAGNOSIS — H8113 Benign paroxysmal vertigo, bilateral: Secondary | ICD-10-CM

## 2020-11-02 DIAGNOSIS — J3089 Other allergic rhinitis: Secondary | ICD-10-CM | POA: Diagnosis not present

## 2020-11-02 MED ORDER — ONDANSETRON HCL 4 MG PO TABS: 4.0000 mg | ORAL_TABLET | Freq: Three times a day (TID) | ORAL | 0 refills | Status: DC | PRN

## 2020-11-02 MED ORDER — TRIAMCINOLONE ACETONIDE 40 MG/ML IJ SUSP
80.0000 mg | Freq: Once | INTRAMUSCULAR | Status: AC
  Administered 2020-11-02: 80 mg via INTRAMUSCULAR

## 2020-11-02 NOTE — Patient Instructions (Signed)
Benign Positional Vertigo Vertigo is the feeling that you or your surroundings are moving when they are not. Benign positional vertigo is the most common form of vertigo. This is usually a harmless condition (benign). This condition is positional. This means that symptoms are triggered by certain movements and positions. This condition can be dangerous if it occurs while you are doing something that could cause harm to you or others. This includes activities such as driving or operating machinery. What are the causes? The inner ear has fluid-filled canals that help your brain sense movement and balance. When the fluid moves, the brain receives messages about your body's position. With benign positional vertigo, crystals in the inner ear break free and disturb the inner ear area. This causes your brain to receive confusing messages about your body's position. What increases the risk? You are more likely to develop this condition if:  You are a woman.  You are 50 years of age or older.  You have recently had a head injury.  You have an inner ear disease. What are the signs or symptoms? Symptoms of this condition usually happen when you move your head or your eyes in different directions. Symptoms may start suddenly, and usually last for less than a minute. They include:  Loss of balance and falling.  Feeling like you are spinning or moving.  Feeling like your surroundings are spinning or moving.  Nausea and vomiting.  Blurred vision.  Dizziness.  Involuntary eye movement (nystagmus). Symptoms can be mild and cause only minor problems, or they can be severe and interfere with daily life. Episodes of benign positional vertigo may return (recur) over time. Symptoms may improve over time. How is this diagnosed? This condition may be diagnosed based on:  Your medical history.  Physical exam of the head, neck, and ears.  Positional tests to check for or stimulate vertigo. You may be  asked to turn your head and change positions, such as going from sitting to lying down. A health care provider will watch for symptoms of vertigo. You may be referred to a health care provider who specializes in ear, nose, and throat problems (ENT, or otolaryngologist) or a provider who specializes in disorders of the nervous system (neurologist). How is this treated? This condition may be treated in a session in which your health care provider moves your head in specific positions to help the displaced crystals in your inner ear move. Treatment for this condition may take several sessions. Surgery may be needed in severe cases, but this is rare. In some cases, benign positional vertigo may resolve on its own in 2-4 weeks.   Follow these instructions at home: Safety  Move slowly. Avoid sudden body or head movements or certain positions, as told by your health care provider.  Avoid driving until your health care provider says it is safe for you to do so.  Avoid operating heavy machinery until your health care provider says it is safe for you to do so.  Avoid doing any tasks that would be dangerous to you or others if vertigo occurs.  If you have trouble walking or keeping your balance, try using a cane for stability. If you feel dizzy or unstable, sit down right away.  Return to your normal activities as told by your health care provider. Ask your health care provider what activities are safe for you. General instructions  Take over-the-counter and prescription medicines only as told by your health care provider.  Drink enough fluid   to keep your urine pale yellow.  Keep all follow-up visits as told by your health care provider. This is important. Contact a health care provider if:  You have a fever.  Your condition gets worse or you develop new symptoms.  Your family or friends notice any behavioral changes.  You have nausea or vomiting that gets worse.  You have numbness or a  prickling and tingling sensation. Get help right away if you:  Have difficulty speaking or moving.  Are always dizzy.  Faint.  Develop severe headaches.  Have weakness in your legs or arms.  Have changes in your hearing or vision.  Develop a stiff neck.  Develop sensitivity to light. Summary  Vertigo is the feeling that you or your surroundings are moving when they are not. Benign positional vertigo is the most common form of vertigo.  This condition is caused by crystals in the inner ear that become displaced. This causes a disturbance in an area of the inner ear that helps your brain sense movement and balance.  Symptoms include loss of balance and falling, feeling that you or your surroundings are moving, nausea and vomiting, and blurred vision.  This condition can be diagnosed based on symptoms, a physical exam, and positional tests.  Follow safety instructions as told by your health care provider. You will also be told when to contact your health care provider in case of problems. This information is not intended to replace advice given to you by your health care provider. Make sure you discuss any questions you have with your health care provider. Document Revised: 04/26/2019 Document Reviewed: 11/11/2017 Elsevier Patient Education  2021 Elsevier Inc.   How to Perform the Epley Maneuver The Epley maneuver is an exercise that relieves symptoms of vertigo. Vertigo is the feeling that you or your surroundings are moving when they are not. When you feel vertigo, you may feel like the room is spinning and may have trouble walking. The Epley maneuver is used for a type of vertigo caused by a calcium deposit in a part of the inner ear. The maneuver involves changing head positions to help the deposit move out of the area. You can do this maneuver at home whenever you have symptoms of vertigo. You can repeat it in 24 hours if your vertigo has not gone away. Even though the Epley  maneuver may relieve your vertigo for a few weeks, it is possible that your symptoms will return. This maneuver relieves vertigo, but it does not relieve dizziness. What are the risks? If it is done correctly, the Epley maneuver is considered safe. Sometimes it can lead to dizziness or nausea that goes away after a short time. If you develop other symptoms--such as changes in vision, weakness, or numbness--stop doing the maneuver and call your health care provider. Supplies needed: A bed or table. A pillow. How to do the Epley maneuver Sit on the edge of a bed or table with your back straight and your legs extended or hanging over the edge of the bed or table. Turn your head halfway toward the affected ear or side as told by your health care provider. Lie backward quickly with your head turned until you are lying flat on your back. You may want to position a pillow under your shoulders. Hold this position for at least 30 seconds. If you feel dizzy or have symptoms of vertigo, continue to hold the position until the symptoms stop. Turn your head to the opposite direction until  your unaffected ear is facing the floor. Hold this position for at least 30 seconds. If you feel dizzy or have symptoms of vertigo, continue to hold the position until the symptoms stop. Turn your whole body to the same side as your head so that you are positioned on your side. Your head will now be nearly facedown. Hold for at least 30 seconds. If you feel dizzy or have symptoms of vertigo, continue to hold the position until the symptoms stop. Sit back up. You can repeat the maneuver in 24 hours if your vertigo does not go away.      Follow these instructions at home: For 24 hours after doing the Epley maneuver: Keep your head in an upright position. When lying down to sleep or rest, keep your head raised (elevated) with two or more pillows. Avoid excessive neck movements. Activity Do not drive or use machinery if you  feel dizzy. After doing the Epley maneuver, return to your normal activities as told by your health care provider. Ask your health care provider what activities are safe for you. General instructions Drink enough fluid to keep your urine pale yellow. Do not drink alcohol. Take over-the-counter and prescription medicines only as told by your health care provider. Keep all follow-up visits as told by your health care provider. This is important. Preventing vertigo symptoms Ask your health care provider if there is anything you should do at home to prevent vertigo. He or she may recommend that you: Keep your head elevated with two or more pillows while you sleep. Do not sleep on the side of your affected ear. Get up slowly from bed. Avoid sudden movements during the day. Avoid extreme head positions or movement, such as looking up or bending over. Contact a health care provider if: Your vertigo gets worse. You have other symptoms, including: Nausea. Vomiting. Headache. Get help right away if you: Have vision changes. Have a headache or neck pain that is severe or getting worse. Cannot stop vomiting. Have new numbness or weakness in any part of your body. Summary Vertigo is the feeling that you or your surroundings are moving when they are not. The Epley maneuver is an exercise that relieves symptoms of vertigo. If the Epley maneuver is done correctly, it is considered safe and relieves vertigo quickly. This information is not intended to replace advice given to you by your health care provider. Make sure you discuss any questions you have with your health care provider. Document Revised: 03/30/2019 Document Reviewed: 03/30/2019 Elsevier Patient Education  2021 [employer].

## 2020-11-02 NOTE — Progress Notes (Signed)
Date:  11/02/2020   ID:  [name], DOB 1966/01/10, MRN [id number] PCP:  Rochel Brome, MD   Evaluation Performed: acute  Chief Complaint:  congestion  History of Present Illness:    [name] is a 55 y.o. female complaining of vertigo. Turns her head and becomes dizzy. Has been around a lot of dust and mold. No fevers. Pt is congestion, Cleaned her ears with ear candle. Worsens with standing also.  Has not lost sense of smell or taste.  Has had sxs x  Using inhaler and flonase.   The patient does not have symptoms concerning for COVID-19 infection (fever, chills, cough, or new shortness of breath).    Past Medical History:  Diagnosis Date  . Allergic reaction to bee sting 09/09/2018  . Anxiety   . Asthma   . Bacterial vaginosis 07/07/2019  . Bulging lumbar disc 07/22/2015  . Cardiomyopathy ([place]) 07/18/2015   Overview:  Overview:  Taka Tsubo 2009, normal coronaries  Last Assessment & Plan:  Relevant Hx: Course: Daily Update: Today's Plan: she has recently been seen by cards and is planned to have echo this Friday  Electronically signed by: Baldemar Friday, [place] 07/24/15 1042  . Carpal tunnel syndrome 07/22/2015  . Cervical strain 01/11/2019  . Chronic fatigue 07/22/2015  . Chronic rhinitis 07/22/2015  . Depression   . Dyslipidemia 07/18/2015  . Esophageal stricture 07/22/2015  . Essential hypertension   . Eustachian tube dysfunction 07/22/2015  . Excessive daytime sleepiness 05/16/2019  . Gastro-esophageal reflux disease without esophagitis   . Gastroesophageal reflux disease without esophagitis 07/18/2015   Last Assessment & Plan:  Relevant Hx: Course: Daily Update: Today's Plan:appears stable with ppi  Electronically signed by: Baldemar Friday, [place] 07/24/15 1043  . High risk medication use 07/22/2015  . History of blood clots   . Hot flash, menopausal 07/22/2015  . Hyperlipidemia 07/22/2015   Last Assessment & Plan:  Relevant Hx: Course: Daily Update: Today's Plan:stable  with meds  Electronically signed by: Baldemar Friday, [place] 07/24/15 1112  . Hypertension   . Localized swelling of both lower legs 07/22/2015  . Localized swelling of both lower legs 07/22/2015  . Low back strain, initial encounter 07/07/2019  . Lung mass   . Mixed hyperlipidemia 07/22/2015   Last Assessment & Plan:  Formatting of this note might be different from the original. Relevant Hx: Course: Daily Update: Today's Plan:stable with meds  Electronically signed by: Baldemar Friday, [place] 07/24/15 1112  . Morbid obesity ([place]) 07/22/2015  . Other insomnia 11/22/2018  . Pneumonia due to COVID-19 virus 01/17/2020  . Pulmonary embolus ([place]) 01/18/2020  . Screening for osteoporosis 02/27/2017   Formatting of this note might be different from the original. 2019: spine 3.1, L femur 1.3  . Seasonal allergic rhinitis due to pollen 07/22/2015  . Snores 07/22/2015   Last Assessment & Plan:  Relevant Hx: Course: Daily Update: Today's Plan:she was to have psg, but has not had this yet. Discussed that if this is present could be worse post-op with pain meds. None the less, she will have protective airway with/during surgery. She should still have psg study, but wants to wait until after knee repair  Electronically signed by: Baldemar Friday, FNP 07/24/15    Past Surgical History:  Procedure Laterality Date  . ABDOMINAL HYSTERECTOMY    . CARPAL TUNNEL RELEASE Right   . CHOLECYSTECTOMY    . ECTOPIC PREGNANCY SURGERY     X 2  .  FOOT SURGERY    . Knee replcement    . POLYPECTOMY    . TONSILECTOMY/ADENOIDECTOMY WITH MYRINGOTOMY  1972  . TOTAL HIP ARTHROPLASTY Right   . WISDOM TOOTH EXTRACTION      Family History  Problem Relation Age of Onset  . Heart attack Father   . Heart disease Father   . Dementia Mother   . Diabetes Half-Sister   . Leukemia Half-Brother   . Multiple myeloma Half-Brother     Social History   Socioeconomic History  . Marital status: Divorced    Spouse name: NONE   . Number of children: 0  . Years of education: [place]  . Highest education level: Not on file  Occupational History  . Occupation: DIABLED  Tobacco Use  . Smoking status: Former Smoker    Types: Cigarettes    Quit date: 06/16/2006    Years since quitting: 14.3  . Smokeless tobacco: Never Used  Vaping Use  . Vaping Use: Never used  Substance and Sexual Activity  . Alcohol use: Yes    Alcohol/week: 3.0 - 4.0 standard drinks    Types: 3 - 4 Cans of beer per week  . Drug use: No  . Sexual activity: Yes  Other Topics Concern  . Not on file  Social History Narrative  . Not on file   Social Determinants of Health   Financial Resource Strain: Not on file  Food Insecurity: Not on file  Transportation Needs: Not on file  Physical Activity: Not on file  Stress: Not on file  Social Connections: Not on file  Intimate Partner Violence: Not on file    Outpatient Medications Prior to Visit  Medication Sig Dispense Refill  . albuterol (VENTOLIN HFA) 108 (90 Base) MCG/ACT inhaler Inhale 1-2 puffs into the lungs every 6 (six) hours as needed for wheezing or shortness of breath.    Marland Kitchen amLODipine (NORVASC) 10 MG tablet Take 1 tablet (10 mg total) by mouth daily. 90 tablet 1  . aspirin EC 81 MG tablet Take 81 mg by mouth daily. Swallow whole.    . esomeprazole (NEXIUM) 20 MG capsule Take 20 mg by mouth daily at 12 noon.    . fluticasone (FLONASE) 50 MCG/ACT nasal spray Place 1 spray into both nostrils daily.    . furosemide (LASIX) 20 MG tablet TAKE 1 TABLET DAILY (Patient taking differently: Take 20 mg by mouth as needed for fluid or edema.) 90 tablet 0  . LORazepam (ATIVAN) 0.5 MG tablet Take 1 tablet (0.5 mg total) by mouth daily as needed for anxiety (panic attacks). 30 tablet 1  . Multiple Vitamin (MULTIVITAMIN) tablet Take 1 tablet by mouth daily.    . nitroGLYCERIN (NITROSTAT) 0.4 MG SL tablet Place 1 tablet (0.4 mg total) under the tongue every 5 (five) minutes as needed  for chest pain (x3 total. call ems if chest pain does not resolve after one, call ems.). 25 tablet 0  . ondansetron (ZOFRAN-ODT) 4 MG disintegrating tablet Take 4 mg by mouth as needed for nausea or vomiting.    . sertraline (ZOLOFT) 100 MG tablet Take 2 tablets (200 mg total) by mouth daily. 180 tablet 1   No facility-administered medications prior to visit.    Allergies:   Demerol, Meperidine, Sulfamethoxazole, Citalopram hydrobromide, Hydrocodone, Hydrocodone-acetaminophen, Metoprolol tartrate, Oxycodone-acetaminophen, Diltiazem, and Metoprolol tartrate   Social History   Tobacco Use  . Smoking status: Former Smoker    Types: Cigarettes    Quit date:  06/16/2006    Years since quitting: 14.3  . Smokeless tobacco: Never Used  Vaping Use  . Vaping Use: Never used  Substance Use Topics  . Alcohol use: Yes    Alcohol/week: 3.0 - 4.0 standard drinks    Types: 3 - 4 Cans of beer per week  . Drug use: No     Review of Systems  Constitutional: Negative for chills, fever and malaise/fatigue.  HENT: Positive for congestion, ear pain and sinus pain. Negative for sore throat.   Respiratory: Negative for cough and shortness of breath.   Cardiovascular: Negative for chest pain.  Neurological: Positive for dizziness and headaches (sinus).     Labs/Other Tests and Data Reviewed:    Recent Labs: 05/04/2020: TSH 1.340 08/22/2020: ALT 20; BUN 12; Creatinine, Ser 0.65; Hemoglobin 14.1; Platelets 259; Potassium 4.7; Sodium 141   Recent Lipid Panel Lab Results  Component Value Date/Time   CHOL 325 (H) 08/22/2020 10:38 AM   TRIG 59 08/22/2020 10:38 AM   HDL 131 08/22/2020 10:38 AM   CHOLHDL 2.5 08/22/2020 10:38 AM   LDLCALC 186 (H) 08/22/2020 10:38 AM    Wt Readings from Last 3 Encounters:  11/02/20 219 lb (99.3 kg)  10/08/20 222 lb (100.7 kg)  08/22/20 224 lb (101.6 kg)     Objective:    Vital Signs:  BP 118/82   Pulse 81   Temp (!) 97.1 F (36.2 C)   Ht $R'5\' 3"'Wg$  (1.6 m)   Wt 219  lb (99.3 kg)   SpO2 99%   BMI 38.79 kg/m    Physical Exam Vitals reviewed.  Constitutional:      Appearance: Normal appearance. She is obese.  HENT:     Right Ear: Tympanic membrane, ear canal and external ear normal.     Left Ear: Tympanic membrane and external ear normal.     Ears:     Comments: Canal tender. No erythema    Nose: Congestion and rhinorrhea present.     Mouth/Throat:     Pharynx: Oropharynx is clear. No oropharyngeal exudate or posterior oropharyngeal erythema.  Cardiovascular:     Rate and Rhythm: Normal rate and regular rhythm.     Heart sounds: Normal heart sounds. No murmur heard.   Pulmonary:     Effort: Pulmonary effort is normal. No respiratory distress.     Breath sounds: Normal breath sounds.  Abdominal:     General: Abdomen is flat. Bowel sounds are normal.     Palpations: Abdomen is soft.     Tenderness: There is no abdominal tenderness.  Neurological:     Mental Status: She is alert and oriented to person, place, and time.     Comments: Positive hall pike maneuver BL.   Psychiatric:        Mood and Affect: Mood normal.        Behavior: Behavior normal.      ASSESSMENT & PLAN:   1. Benign paroxysmal positional vertigo due to bilateral vestibular disorder Epley maneuver recommended. - ondansetron (ZOFRAN) 4 MG tablet; Take 1 tablet (4 mg total) by mouth every 8 (eight) hours as needed for nausea or vomiting.  Dispense: 20 tablet; Refill: 0  2. Non-seasonal allergic rhinitis due to other allergic trigger Continue Allegra. Call back Monday if not improving and will sent antibiotic for possible sinusitis.  - triamcinolone acetonide (KENALOG-40) injection 80 mg   Meds ordered this encounter  Medications  . triamcinolone acetonide (KENALOG-40) injection 80 mg  . ondansetron (ZOFRAN)  4 MG tablet    Sig: Take 1 tablet (4 mg total) by mouth every 8 (eight) hours as needed for nausea or vomiting.    Dispense:  20 tablet    Refill:  0     COVID-19 Education: The signs and symptoms of COVID-19 were discussed with the patient and how to seek care for testing (follow up with PCP or arrange E-visit). The importance of social distancing was discussed today.  I spent 30 minutes with patient.   SignedRochel Brome, MD  11/02/2020 10:53 AM    [place]

## 2020-11-05 ENCOUNTER — Telehealth: Payer: Self-pay

## 2020-11-05 ENCOUNTER — Other Ambulatory Visit: Payer: Self-pay | Admitting: Family Medicine

## 2020-11-05 ENCOUNTER — Other Ambulatory Visit: Payer: Self-pay

## 2020-11-05 DIAGNOSIS — J3089 Other allergic rhinitis: Secondary | ICD-10-CM

## 2020-11-05 MED ORDER — AMOXICILLIN 875 MG PO TABS: 875.0000 mg | ORAL_TABLET | Freq: Two times a day (BID) | ORAL | 0 refills | Status: AC

## 2020-11-05 NOTE — Telephone Encounter (Signed)
I sent amoxicillin.  Pt may wish to do a covid 19 test since her sxs have progressed. She may have a home test.If not, she may come by office for nurse visit for covid test from car.

## 2020-11-05 NOTE — Telephone Encounter (Signed)
Pt called back this morning stating that she is still not feeling any better. Pts current symptoms include: vertigo, sinus symptoms, fatigue, nausea with standing, and breaking out with a cold sweat. Also, Pt stated that she feels that her symptoms are trying to go down into her chest. She was told to call back on Monday if she was not feeling better.  Pt was notified that a message will be sent to Dr. Sedalia Muta and a nurse or I will call her back today.

## 2020-11-05 NOTE — Telephone Encounter (Signed)
Patient informed.  She is going to come in for rapid covid test.

## 2020-11-22 NOTE — Progress Notes (Signed)
Subjective:  Patient ID: [name], female    DOB: July 15, 1965  Age: 55 y.o. MRN: [id number]  Chief Complaint  Patient presents with   Hyperlipidemia   Hypertension    HPI Essential hypertension Amlodipine 10 mg daily. Did not take medicines.   Mixed hyperlipidemia Takes Lipitor, but has not been taking it regularly. Last took 2 months ago.  Causes muscle pain.  Eating healthy. Not exercising.   GAD (generalized anxiety disorder) Ativan 0.5 once daily prn. Takes about twice a week.   Gastroesophageal reflux disease without esophagitis Nexium 20 mg once daily  Depression, major, recurrent, mild   Sertraline 100 mg 2 tablets daily.  Current Outpatient Medications on File Prior to Visit  Medication Sig Dispense Refill   albuterol (VENTOLIN HFA) 108 (90 Base) MCG/ACT inhaler Inhale 1-2 puffs into the lungs every 6 (six) hours as needed for wheezing or shortness of breath.     amLODipine (NORVASC) 10 MG tablet Take 1 tablet (10 mg total) by mouth daily. 90 tablet 1   aspirin EC 81 MG tablet Take 81 mg by mouth daily. Swallow whole.     esomeprazole (NEXIUM) 20 MG capsule Take 20 mg by mouth daily at 12 noon.     fluticasone (FLONASE) 50 MCG/ACT nasal spray Place 1 spray into both nostrils daily.     furosemide (LASIX) 20 MG tablet TAKE 1 TABLET DAILY (Patient taking differently: Take 20 mg by mouth as needed for fluid or edema.) 90 tablet 0   LORazepam (ATIVAN) 0.5 MG tablet Take 1 tablet (0.5 mg total) by mouth daily as needed for anxiety (panic attacks). 30 tablet 1   Multiple Vitamin (MULTIVITAMIN) tablet Take 1 tablet by mouth daily.     nitroGLYCERIN (NITROSTAT) 0.4 MG SL tablet Place 1 tablet (0.4 mg total) under the tongue every 5 (five) minutes as needed for chest pain (x3 total. call ems if chest pain does not resolve after one, call ems.). 25 tablet 0   ondansetron (ZOFRAN) 4 MG tablet Take 1 tablet (4 mg total) by mouth every 8 (eight) hours as needed for nausea or  vomiting. 20 tablet 0   ondansetron (ZOFRAN-ODT) 4 MG disintegrating tablet Take 4 mg by mouth as needed for nausea or vomiting.     sertraline (ZOLOFT) 100 MG tablet Take 2 tablets (200 mg total) by mouth daily. 180 tablet 1   No current facility-administered medications on file prior to visit.   Past Medical History:  Diagnosis Date   Allergic reaction to bee sting 09/09/2018   Anxiety    Asthma    Bacterial vaginosis 07/07/2019   Bulging lumbar disc 07/22/2015   Cardiomyopathy ([facility]) 07/18/2015   Overview:  Overview:  Taka Tsubo 2009, normal coronaries  Last Assessment & Plan:  Relevant Hx: Course: Daily Update: Today's Plan: she has recently been seen by cards and is planned to have echo this Friday  Electronically signed by: Jenelle Mages, FNP 07/24/15 1042   Carpal tunnel syndrome 07/22/2015   Cervical strain 01/11/2019   Chronic fatigue 07/22/2015   Chronic rhinitis 07/22/2015   Depression    Dyslipidemia 07/18/2015   Esophageal stricture 07/22/2015   Essential hypertension    Eustachian tube dysfunction 07/22/2015   Excessive daytime sleepiness 05/16/2019   Gastro-esophageal reflux disease without esophagitis    Gastroesophageal reflux disease without esophagitis 07/18/2015   Last Assessment & Plan:  Relevant Hx: Course: Daily Update: Today's Plan:appears stable with ppi  Electronically signed by: Jenelle Mages,  FNP 07/24/15 1043   High risk medication use 07/22/2015   History of blood clots    Hot flash, menopausal 07/22/2015   Hyperlipidemia 07/22/2015   Last Assessment & Plan:  Relevant Hx: Course: Daily Update: Today's Plan:stable with meds  Electronically signed by: Baldemar Friday, [place] 07/24/15 1112   Hypertension    Localized swelling of both lower legs 07/22/2015   Localized swelling of both lower legs 07/22/2015   Low back strain, initial encounter 07/07/2019   Lung mass    Mixed hyperlipidemia 07/22/2015   Last Assessment & Plan:  Formatting of this note might be  different from the original. Relevant Hx: Course: Daily Update: Today's Plan:stable with meds  Electronically signed by: Baldemar Friday, [place] 07/24/15 1112   Morbid obesity ([place]) 07/22/2015   Other insomnia 11/22/2018   Pneumonia due to COVID-19 virus 01/17/2020   Pulmonary embolus ([place]) 01/18/2020   Screening for osteoporosis 02/27/2017   Formatting of this note might be different from the original. 2019: spine 3.1, L femur 1.3   Seasonal allergic rhinitis due to pollen 07/22/2015   Snores 07/22/2015   Last Assessment & Plan:  Relevant Hx: Course: Daily Update: Today's Plan:she was to have psg, but has not had this yet. Discussed that if this is present could be worse post-op with pain meds. None the less, she will have protective airway with/during surgery. She should still have psg study, but wants to wait until after knee repair  Electronically signed by: Baldemar Friday, FNP 07/24/15   Past Surgical History:  Procedure Laterality Date   ABDOMINAL HYSTERECTOMY     CARPAL TUNNEL RELEASE Right    CHOLECYSTECTOMY     ECTOPIC PREGNANCY SURGERY     X 2   FOOT SURGERY     Knee replcement     POLYPECTOMY     TONSILECTOMY/ADENOIDECTOMY WITH MYRINGOTOMY  1972   TOTAL HIP ARTHROPLASTY Right    WISDOM TOOTH EXTRACTION      Family History  Problem Relation Age of Onset   Heart attack Father    Heart disease Father    Dementia Mother    Diabetes Half-Sister    Leukemia Half-Brother    Multiple myeloma Half-Brother    Social History   Socioeconomic History   Marital status: Divorced    Spouse name: NONE   Number of children: 0   Years of education: 74 +  [employer]   Highest education level: Not on file  Occupational History   Occupation: DIABLED  Tobacco Use   Smoking status: Former    Pack years: 0.00    Types: Cigarettes    Quit date: 06/16/2006    Years since quitting: 14.4   Smokeless tobacco: Never  Vaping Use   Vaping Use: Never used  Substance and Sexual  Activity   Alcohol use: Yes    Alcohol/week: 3.0 - 4.0 standard drinks    Types: 3 - 4 Cans of beer per week   Drug use: No   Sexual activity: Yes  Other Topics Concern   Not on file  Social History Narrative   Not on file   Social Determinants of Health   Financial Resource Strain: Not on file  Food Insecurity: Not on file  Transportation Needs: Not on file  Physical Activity: Not on file  Stress: Not on file  Social Connections: Not on file    Review of Systems  Constitutional:  Negative for chills, fatigue and fever.  HENT:  Positive  for congestion and rhinorrhea. Negative for ear pain and sore throat.   Respiratory:  Negative for cough and shortness of breath.   Cardiovascular:  Negative for chest pain.  Gastrointestinal:  Negative for abdominal pain, constipation, diarrhea, nausea and vomiting.  Genitourinary:  Negative for dysuria and urgency.  Musculoskeletal:  Negative for back pain and myalgias.  Neurological:  Negative for dizziness, weakness, light-headedness and headaches.  Psychiatric/Behavioral:  Negative for dysphoric mood. The patient is not nervous/anxious.     Objective:  BP 140/86   Pulse 72   Temp (!) 97.4 F (36.3 C)   Resp 18   Ht $R'5\' 3"'bv$  (1.6 m)   Wt 218 lb 3.2 oz (99 kg)   BMI 38.65 kg/m   BP/Weight 11/23/2020 11/02/2020 0/25/8527  Systolic BP 782 423 536  Diastolic BP 86 82 72  Wt. (Lbs) 218.2 219 222  BMI 38.65 38.79 39.33    Physical Exam Vitals reviewed.  Constitutional:      Appearance: Normal appearance. She is normal weight.  Neck:     Vascular: No carotid bruit.  Cardiovascular:     Rate and Rhythm: Normal rate and regular rhythm.     Heart sounds: Normal heart sounds.  Pulmonary:     Effort: Pulmonary effort is normal. No respiratory distress.     Breath sounds: Normal breath sounds.  Abdominal:     General: Abdomen is flat. Bowel sounds are normal.     Palpations: Abdomen is soft.     Tenderness: There is no abdominal  tenderness.  Neurological:     Mental Status: She is alert and oriented to person, place, and time.  Psychiatric:        Mood and Affect: Mood normal.        Behavior: Behavior normal.    Diabetic Foot Exam - Simple   No data filed      Lab Results  Component Value Date   WBC 4.4 08/22/2020   HGB 14.1 08/22/2020   HCT 41.2 08/22/2020   PLT 259 08/22/2020   GLUCOSE 94 08/22/2020   CHOL 325 (H) 08/22/2020   TRIG 59 08/22/2020   HDL 131 08/22/2020   LDLCALC 186 (H) 08/22/2020   ALT 20 08/22/2020   AST 18 08/22/2020   NA 141 08/22/2020   K 4.7 08/22/2020   CL 101 08/22/2020   CREATININE 0.65 08/22/2020   BUN 12 08/22/2020   CO2 24 08/22/2020   TSH 1.340 05/04/2020      Assessment & Plan:   1. Essential hypertension Little high today. Pt to work on diet an dexercise rather than adjusting medicines.  No changes to medicines.  Continue to work on eating a healthy diet and exercise.  Labs drawn today.  - Comprehensive metabolic panel - TSH  2. Mixed hyperlipidemia Recommend crestor. Intolerant to lipitor.  Recommend continue to work on eating healthy diet and exercise. - CBC with Differential/Platelet - Lipid panel  3. GAD (generalized anxiety disorder) Continue zoloft and lorazepam.  Well controlled.   4. Gastroesophageal reflux disease without esophagitis Continue nexium.  5. Depression, major, recurrent, mild ([place]) The current medical regimen is effective;  continue present plan and medications.  6. Need for vaccination - Pneumococcal polysaccharide vaccine 23-valent greater than or equal to 2yo subcutaneous/IM  7. Class 2 severe obesity due to excess calories with serious comorbidity and body mass index (BMI) of 38.0 to 38.9 in adult (HCC) Weight loss medicines: to research [place], Mancel Parsons Not a candidate for phentermine (  adipex) or qsymia (adipex/topiramate)  8. Visit for screening mammogram - MM DIGITAL SCREENING BILATERAL     Follow-up: Return  in about 3 months (around 02/23/2021) for fasting.  An After Visit Summary was printed and given to the patient.  Rochel Brome, MD [name] [phone]

## 2020-11-23 ENCOUNTER — Other Ambulatory Visit: Payer: Self-pay

## 2020-11-23 ENCOUNTER — Ambulatory Visit (INDEPENDENT_AMBULATORY_CARE_PROVIDER_SITE_OTHER): Payer: Medicare HMO | Admitting: Family Medicine

## 2020-11-23 ENCOUNTER — Encounter: Payer: Self-pay | Admitting: Family Medicine

## 2020-11-23 VITALS — BP 140/86 | HR 72 | Temp 97.4°F | Resp 18 | Ht 63.0 in | Wt 218.2 lb

## 2020-11-23 DIAGNOSIS — R69 Illness, unspecified: Secondary | ICD-10-CM | POA: Diagnosis not present

## 2020-11-23 DIAGNOSIS — Z23 Encounter for immunization: Secondary | ICD-10-CM | POA: Diagnosis not present

## 2020-11-23 DIAGNOSIS — Z6838 Body mass index (BMI) 38.0-38.9, adult: Secondary | ICD-10-CM | POA: Diagnosis not present

## 2020-11-23 DIAGNOSIS — K219 Gastro-esophageal reflux disease without esophagitis: Secondary | ICD-10-CM

## 2020-11-23 DIAGNOSIS — Z1231 Encounter for screening mammogram for malignant neoplasm of breast: Secondary | ICD-10-CM

## 2020-11-23 DIAGNOSIS — R7301 Impaired fasting glucose: Secondary | ICD-10-CM | POA: Diagnosis not present

## 2020-11-23 DIAGNOSIS — F411 Generalized anxiety disorder: Secondary | ICD-10-CM | POA: Diagnosis not present

## 2020-11-23 DIAGNOSIS — I10 Essential (primary) hypertension: Secondary | ICD-10-CM | POA: Diagnosis not present

## 2020-11-23 DIAGNOSIS — F330 Major depressive disorder, recurrent, mild: Secondary | ICD-10-CM

## 2020-11-23 DIAGNOSIS — E782 Mixed hyperlipidemia: Secondary | ICD-10-CM

## 2020-11-23 NOTE — Patient Instructions (Signed)
Weight loss medicines: Arville Lime Not a candidate for phentermine (adipex) or qsymia (adipex/topiramate)

## 2020-11-24 LAB — CBC WITH DIFFERENTIAL/PLATELET
Basophils Absolute: 0 10*3/uL (ref 0.0–0.2)
Basos: 1 %
EOS (ABSOLUTE): 0 10*3/uL (ref 0.0–0.4)
Eos: 0 %
Hematocrit: 41.5 % (ref 34.0–46.6)
Hemoglobin: 14.4 g/dL (ref 11.1–15.9)
Immature Grans (Abs): 0 10*3/uL (ref 0.0–0.1)
Immature Granulocytes: 0 %
Lymphocytes Absolute: 1.4 10*3/uL (ref 0.7–3.1)
Lymphs: 31 %
MCH: 34.3 pg — ABNORMAL HIGH (ref 26.6–33.0)
MCHC: 34.7 g/dL (ref 31.5–35.7)
MCV: 99 fL — ABNORMAL HIGH (ref 79–97)
Monocytes Absolute: 0.6 10*3/uL (ref 0.1–0.9)
Monocytes: 13 %
Neutrophils Absolute: 2.5 10*3/uL (ref 1.4–7.0)
Neutrophils: 55 %
Platelets: 327 10*3/uL (ref 150–450)
RBC: 4.2 x10E6/uL (ref 3.77–5.28)
RDW: 12.2 % (ref 11.7–15.4)
WBC: 4.5 10*3/uL (ref 3.4–10.8)

## 2020-11-24 LAB — COMPREHENSIVE METABOLIC PANEL
ALT: 27 IU/L (ref 0–32)
AST: 21 IU/L (ref 0–40)
Albumin/Globulin Ratio: 2.3 — ABNORMAL HIGH (ref 1.2–2.2)
Albumin: 4.5 g/dL (ref 3.8–4.9)
Alkaline Phosphatase: 109 IU/L (ref 44–121)
BUN/Creatinine Ratio: 17 (ref 9–23)
BUN: 11 mg/dL (ref 6–24)
Bilirubin Total: 0.5 mg/dL (ref 0.0–1.2)
CO2: 28 mmol/L (ref 20–29)
Calcium: 9.8 mg/dL (ref 8.7–10.2)
Chloride: 97 mmol/L (ref 96–106)
Creatinine, Ser: 0.66 mg/dL (ref 0.57–1.00)
Globulin, Total: 2 g/dL (ref 1.5–4.5)
Glucose: 110 mg/dL — ABNORMAL HIGH (ref 65–99)
Potassium: 4.6 mmol/L (ref 3.5–5.2)
Sodium: 138 mmol/L (ref 134–144)
Total Protein: 6.5 g/dL (ref 6.0–8.5)
eGFR: 104 mL/min/{1.73_m2} (ref >59)

## 2020-11-24 LAB — LIPID PANEL
Chol/HDL Ratio: 2.2 ratio (ref 0.0–4.4)
Cholesterol, Total: 326 mg/dL — ABNORMAL HIGH (ref 100–199)
HDL: 146 mg/dL
LDL Chol Calc (NIH): 172 mg/dL — ABNORMAL HIGH (ref 0–99)
Triglycerides: 62 mg/dL (ref 0–149)
VLDL Cholesterol Cal: 8 mg/dL (ref 5–40)

## 2020-11-24 LAB — TSH: TSH: 1.55 u[IU]/mL (ref 0.450–4.500)

## 2020-11-24 LAB — CARDIOVASCULAR RISK ASSESSMENT

## 2020-11-26 MED ORDER — FUROSEMIDE 20 MG PO TABS: 20.0000 mg | ORAL_TABLET | ORAL | 0 refills | Status: DC | PRN

## 2020-11-27 ENCOUNTER — Other Ambulatory Visit: Payer: Self-pay

## 2020-11-27 MED ORDER — ROSUVASTATIN CALCIUM 10 MG PO TABS: 10.0000 mg | ORAL_TABLET | Freq: Every day | ORAL | 2 refills | Status: DC

## 2020-11-28 LAB — HGB A1C W/O EAG: Hgb A1c MFr Bld: 5 % (ref 4.8–5.6)

## 2020-11-28 LAB — SPECIMEN STATUS REPORT

## 2020-12-06 LAB — HM MAMMOGRAPHY

## 2020-12-26 ENCOUNTER — Other Ambulatory Visit: Payer: Self-pay | Admitting: Family Medicine

## 2020-12-26 DIAGNOSIS — F411 Generalized anxiety disorder: Secondary | ICD-10-CM

## 2020-12-26 DIAGNOSIS — F331 Major depressive disorder, recurrent, moderate: Secondary | ICD-10-CM

## 2021-01-07 ENCOUNTER — Ambulatory Visit
Admission: RE | Admit: 2021-01-07 | Discharge: 2021-01-07 | Disposition: A | Discharge status: Home / Self Care | Payer: Medicare HMO | Source: Ambulatory Visit | Attending: Family Medicine | Admitting: Family Medicine

## 2021-01-07 ENCOUNTER — Other Ambulatory Visit: Payer: Self-pay

## 2021-01-07 ENCOUNTER — Ambulatory Visit (INDEPENDENT_AMBULATORY_CARE_PROVIDER_SITE_OTHER): Payer: Medicare HMO | Admitting: Nurse Practitioner

## 2021-01-07 ENCOUNTER — Encounter: Payer: Self-pay | Admitting: Nurse Practitioner

## 2021-01-07 VITALS — BP 130/94 | HR 94 | Temp 97.5°F | Ht 63.0 in | Wt 219.0 lb

## 2021-01-07 DIAGNOSIS — H9201 Otalgia, right ear: Secondary | ICD-10-CM

## 2021-01-07 DIAGNOSIS — J018 Other acute sinusitis: Secondary | ICD-10-CM | POA: Diagnosis not present

## 2021-01-07 DIAGNOSIS — I10 Essential (primary) hypertension: Secondary | ICD-10-CM

## 2021-01-07 DIAGNOSIS — Z1231 Encounter for screening mammogram for malignant neoplasm of breast: Secondary | ICD-10-CM | POA: Diagnosis not present

## 2021-01-07 DIAGNOSIS — G4452 New daily persistent headache (NDPH): Secondary | ICD-10-CM

## 2021-01-07 MED ORDER — AMOXICILLIN 500 MG PO CAPS: 500.0000 mg | ORAL_CAPSULE | Freq: Two times a day (BID) | ORAL | 0 refills | Status: AC

## 2021-01-07 NOTE — Patient Instructions (Addendum)
Rest and push fluids Continue Tylenol as needed Keep headache record Use Flonase nasal spray daily Resume Xyzal allergy medication Take Amoxicillin twice daily for 10 days Follow-up if no improvement in 2 weeks  Sinusitis, Adult Sinusitis is soreness and swelling (inflammation) of your sinuses. Sinuses are hollow spaces in the bones around your face. They are located: Around your eyes. In the middle of your forehead. Behind your nose. In your cheekbones. Your sinuses and nasal passages are lined with a fluid called mucus. Mucus drains out of your sinuses. Swelling can trap mucus in your sinuses. This lets germs (bacteria, virus, or fungus) grow, which leads to infection. Most of the time, this condition is caused bya virus. What are the causes? This condition is caused by: Allergies. Asthma. Germs. Things that block your nose or sinuses. Growths in the nose (nasal polyps). Chemicals or irritants in the air. Fungus (rare). What increases the risk? You are more likely to develop this condition if: You have a weak body defense system (immune system). You do a lot of swimming or diving. You use nasal sprays too much. You smoke. What are the signs or symptoms? The main symptoms of this condition are pain and a feeling of pressure around the sinuses. Other symptoms include: Stuffy nose (congestion). Runny nose (drainage). Swelling and warmth in the sinuses. Headache. Toothache. A cough that may get worse at night. Mucus that collects in the throat or the back of the nose (postnasal drip). Being unable to smell and taste. Being very tired (fatigue). A fever. Sore throat. Bad breath. How is this diagnosed? This condition is diagnosed based on: Your symptoms. Your medical history. A physical exam. Tests to find out if your condition is short-term (acute) or long-term (chronic). Your doctor may: Check your nose for growths (polyps). Check your sinuses using a tool that has a  light (endoscope). Check for allergies or germs. Do imaging tests, such as an MRI or CT scan. How is this treated? Treatment for this condition depends on the cause and whether it is short-term or long-term. If caused by a virus, your symptoms should go away on their own within 10 days. You may be given medicines to relieve symptoms. They include: Medicines that shrink swollen tissue in the nose. Medicines that treat allergies (antihistamines). A spray that treats swelling of the nostrils.  Rinses that help get rid of thick mucus in your nose (nasal saline washes). If caused by bacteria, your doctor may wait to see if you will get better without treatment. You may be given antibiotic medicine if you have: A very bad infection. A weak body defense system. If caused by growths in the nose, you may need to have surgery. Follow these instructions at home: Medicines Take, use, or apply over-the-counter and prescription medicines only as told by your doctor. These may include nasal sprays. If you were prescribed an antibiotic medicine, take it as told by your doctor. Do not stop taking the antibiotic even if you start to feel better. Hydrate and humidify  Drink enough water to keep your pee (urine) pale yellow. Use a cool mist humidifier to keep the humidity level in your home above 50%. Breathe in steam for 10-15 minutes, 3-4 times a day, or as told by your doctor. You can do this in the bathroom while a hot shower is running. Try not to spend time in cool or dry air.  Rest Rest as much as you can. Sleep with your head raised (elevated). Make  sure you get enough sleep each night. General instructions  Put a warm, moist washcloth on your face 3-4 times a day, or as often as told by your doctor. This will help with discomfort. Wash your hands often with soap and water. If there is no soap and water, use hand sanitizer. Do not smoke. Avoid being around people who are smoking (secondhand  smoke). Keep all follow-up visits as told by your doctor. This is important.  Contact a doctor if: You have a fever. Your symptoms get worse. Your symptoms do not get better within 10 days. Get help right away if: You have a very bad headache. You cannot stop throwing up (vomiting). You have very bad pain or swelling around your face or eyes. You have trouble seeing. You feel confused. Your neck is stiff. You have trouble breathing. Summary Sinusitis is swelling of your sinuses. Sinuses are hollow spaces in the bones around your face. This condition is caused by tissues in your nose that become inflamed or swollen. This traps germs. These can lead to infection. If you were prescribed an antibiotic medicine, take it as told by your doctor. Do not stop taking it even if you start to feel better. Keep all follow-up visits as told by your doctor. This is important. This information is not intended to replace advice given to you by your health care provider. Make sure you discuss any questions you have with your healthcare provider. Document Revised: 11/02/2017 Document Reviewed: 11/02/2017 Else[employer] Patient Education  2022 [employer]   Occipital Neuralgia  Occipital neuralgia is a type of headache that causes brief episodes of very bad pain in the back of the head. Pain from occipital neuralgia may spread (radiate) to other parts of the head. These headaches may be caused by irritation of the nerves that leave the spinal cord high up in the neck, just below the base of the skull (occipital nerves). The occipital nerves transmit sensations from the back of the head, the topof the head, and the areas behind the ears. What are the causes? This condition can occur without any known cause (primary headache syndrome). In other cases, this condition is caused by pressure on or irritation of one of the two occipital nerves. Pressure and irritation may be due to: Muscle spasm in the neck. Neck  injury. Wear and tear of the vertebrae in the neck (osteoarthritis). Disease of the disks that separate the vertebrae. Swollen blood vessels that put pressure on the occipital nerves. Infections. Tumors. Diabetes. What are the signs or symptoms? This condition causes brief burning, stabbing, electric, shocking, or shooting pain in the back of the head that can radiate to the top of the head. It can happen on one side or both sides of the head. It can also cause: Pain behind the eye. Pain triggered by neck movement or hair brushing. Scalp tenderness. Aching in the back of the head between episodes of very bad pain. Pain that gets worse with exposure to bright lights. How is this diagnosed? Your health care provider may diagnose the condition based on a physical exam and your symptoms. Tests may be done, such as: Imaging studies of the brain and neck (cervical spine), such as an MRI or CT scan. These look for causes of pinched nerves. Applying pressure to the nerves in the neck to try to re-create the pain. Injection of numbing medicine into the occipital nerve areas to see if pain goes away (diagnostic nerve block). How is this treated?  Treatment for this condition may begin with simple measures, such as: Rest. Massage. Applying heat or cold to the area. Over-the-counter pain relievers. If these measures do not work, you may need other treatments, including: Medicines, such as: Prescription-strength anti-inflammatory medicines. Muscle relaxants. Anti-seizure medicines, which can relieve pain. Antidepressants, which can relieve pain. Injected medicines, such as medicines that numb the area (local anesthetic) and steroids. Pulsed radiofrequency ablation. This is when wires are implanted to deliver electrical impulses that block pain signals from the occipital nerve. Surgery to relieve nerve pressure. Physical therapy. Follow these instructions at home: Managing pain     Avoid any  activities that cause pain. Rest when you have an attack of pain. Try gentle massage to relieve pain. Try a different pillow or sleeping position. If directed, apply heat to the affected area as often as told by your health care provider. Use the heat source that your health care provider recommends, such as a moist heat pack or a heating pad. Place a towel between your skin and the heat source. Leave the heat on for 20-30 minutes. Remove the heat if your skin turns bright red. This is especially important if you are unable to feel pain, heat, or cold. You have a greater risk of getting burned. If directed, put ice on the back of your head and neck area. To do this: Put ice in a plastic bag. Place a towel between your skin and the bag. Leave the ice on for 20 minutes, 2-3 times a day. Remove the ice if your skin turns bright red. This is very important. If you cannot feel pain, heat, or cold, you have a greater risk of damage to the area. General instructions Take over-the-counter and prescription medicines only as told by your health care provider. Avoid things that make your symptoms worse, such as bright lights. Try to stay active. Get regular exercise that does not cause pain. Ask your health care provider to suggest safe exercises for you. Work with a physical therapist to learn stretching exercises you can do at home. Practice good posture. Keep all follow-up visits. This is important. Contact a health care provider if: Your medicine is not working. You have new or worsening symptoms. Get help right away if: You have very bad head pain that does not go away. You have a sudden change in vision, balance, or speech. These symptoms may represent a serious problem that is an emergency. Do not wait to see if the symptoms will go away. Get medical help right away. Call your local emergency services (911 in the [place] Do not drive yourself to the hospital. Summary Occipital neuralgia is a  type of headache that causes brief episodes of very bad pain in the back of the head. Pain from occipital neuralgia may spread (radiate) to other parts of the head. Treatment for this condition includes rest, massage, and medicines. This information is not intended to replace advice given to you by your health care provider. Make sure you discuss any questions you have with your healthcare provider. Document Revised: 04/01/2020 Document Reviewed: 04/01/2020 Else[employer] Patient Education  2022 [employer]     Form - Headache Record There are many types and causes of headaches. A headache record can help guide your treatment plan. Use this form to record the details. Bring this form with you to your follow-up visits. Follow your health care provider's instructions on how to describe your headache. You may be asked to: Use a pain scale. This  is a tool to rate the intensity of your headache using words or numbers. Describe what your headache feels like, such as dull, achy, throbbing, or sharp. Headache record Date: _______________ Time (from start to end): ____________________ Location of the headache: _________________________ Intensity of the headache: ____________________ Description of the headache: ______________________________________________________________ Hours of sleep the night before the headache: __________ Food or drinks before the headache started: ______________________________________________________________________________________ Events before the headache started: _______________________________________________________________________________________________ Symptoms before the headache started: __________________________________________________________________________________________ Symptoms during the headache: __________________________________________________________________________________________________ Treatment:  ________________________________________________________________________________________________________________ Effect of treatment: _________________________________________________________________________________________________________ Other comments: ___________________________________________________________________________________________________________ Date: _______________ Time (from start to end): ____________________ Location of the headache: _________________________ Intensity of the headache: ____________________ Description of the headache: ______________________________________________________________ Hours of sleep the night before the headache: __________ Food or drinks before the headache started: ______________________________________________________________________________________ Events before the headache started: ____________________________________________________________________________________________ Symptoms before the headache started: _________________________________________________________________________________________ Symptoms during the headache: _______________________________________________________________________________________________ Treatment: ________________________________________________________________________________________________________________ Effect of treatment: _________________________________________________________________________________________________________ Other comments: ___________________________________________________________________________________________________________ Date: _______________ Time (from start to end): ____________________ Location of the headache: _________________________ Intensity of the headache: ____________________ Description of the headache: ______________________________________________________________ Hours of sleep the night before the headache: __________ Food or drinks before the headache started:  ______________________________________________________________________________________ Events before the headache started: ____________________________________________________________________________________________ Symptoms before the headache started: _________________________________________________________________________________________ Symptoms during the headache: _______________________________________________________________________________________________ Treatment: ________________________________________________________________________________________________________________ Effect of treatment: _________________________________________________________________________________________________________ Other comments: ___________________________________________________________________________________________________________ Date: _______________ Time (from start to end): ____________________ Location of the headache: _________________________ Intensity of the headache: ____________________ Description of the headache: ______________________________________________________________ Hours of sleep the night before the headache: _________ Food or drinks before the headache started: ______________________________________________________________________________________ Events before the headache started: ____________________________________________________________________________________________ Symptoms before the headache started: _________________________________________________________________________________________ Symptoms during the headache: _______________________________________________________________________________________________ Treatment: ________________________________________________________________________________________________________________ Effect of treatment: _________________________________________________________________________________________________________ Other  comments: ___________________________________________________________________________________________________________ Date: _______________ Time (from start to end): ____________________ Location of the headache: _________________________ Intensity of the headache: ____________________ Description of the headache: ______________________________________________________________ Hours of sleep the night before the headache: _________ Food or drinks before the headache started: ______________________________________________________________________________________ Events before the headache started: ____________________________________________________________________________________________ Symptoms before the headache started: _________________________________________________________________________________________ Symptoms during the headache: _______________________________________________________________________________________________ Treatment: ________________________________________________________________________________________________________________ Effect of treatment: _________________________________________________________________________________________________________ Other comments: ___________________________________________________________________________________________________________ This information is not intended to replace advice given to you by your health care provider. Make sure you discuss any questions you have with your healthcare provider. Document Revised: 06/21/2018 Document Reviewed: 06/21/2018 [employer] Patient Education  2022 [employer].

## 2021-01-07 NOTE — Progress Notes (Signed)
Acute Office Visit  Subjective:    Patient ID: [name], female    DOB: 16-Jun-1966, 55 y.o.   MRN: [id number]  Chief Complaint  Patient presents with   Headache        HPI [name] is a 55 year old Caucasian female that presents with headache beginning at left posterior head pain radiating superiorly above left eye. She has a past history of chronic allergic rhinitis. Denies nausea, vomiting, photophobia, or vision changes. Onset began 2-days-ago. Tylenol 1,000 mg and rest alleviates pain. She denies previous prolonged headaches or migraines. States she uses Flonase nasal spray and Xyzal during spring months and heavy pollen seasons but has not used them recently. She tells me she did get overheated two-days-ago while mowing her grass. [name] states she had COVID-19 pneumonia in 2021 that caused right lower lobe pulmonary embolism. She was taking Eliquis for some time but has transitioned to ASA 81 mg. She wears prescription eyeglasses. Last eye exam Dec 2021 with new glasses. BP is elevated in the office today at 138/92. [name] does have a history of hypertension that is treated with Amlodipine 10 mg. States she has been taking Amlodipine 5 mg due to past history of hypotension. Repeat BP 130/94, she tells me she will take additional 5 mg after returning home.   Past Medical History:  Diagnosis Date   Allergic reaction to bee sting 09/09/2018   Anxiety    Asthma    Bacterial vaginosis 07/07/2019   Bulging lumbar disc 07/22/2015   Cardiomyopathy ([place]) 07/18/2015   Overview:  Overview:  [place] 2009, normal coronaries  Last Assessment & Plan:  Relevant Hx: Course: Daily Update: Today's Plan: she has recently been seen by cards and is planned to have echo this Friday  Electronically signed by: Baldemar Friday, [place] 07/24/15 1042   Carpal tunnel syndrome 07/22/2015   Cervical strain 01/11/2019   Chronic fatigue 07/22/2015   Chronic rhinitis 07/22/2015   Depression    Dyslipidemia 07/18/2015    Esophageal stricture 07/22/2015   Essential hypertension    Eustachian tube dysfunction 07/22/2015   Excessive daytime sleepiness 05/16/2019   Gastro-esophageal reflux disease without esophagitis    Gastroesophageal reflux disease without esophagitis 07/18/2015   Last Assessment & Plan:  Relevant Hx: Course: Daily Update: Today's Plan:appears stable with ppi  Electronically signed by: Baldemar Friday, [place] 07/24/15 1043   High risk medication use 07/22/2015   History of blood clots    Hot flash, menopausal 07/22/2015   Hyperlipidemia 07/22/2015   Last Assessment & Plan:  Relevant Hx: Course: Daily Update: Today's Plan:stable with meds  Electronically signed by: Baldemar Friday, [place] 07/24/15 1112   Hypertension    Localized swelling of both lower legs 07/22/2015   Localized swelling of both lower legs 07/22/2015   Low back strain, initial encounter 07/07/2019   Lung mass    Mixed hyperlipidemia 07/22/2015   Last Assessment & Plan:  Formatting of this note might be different from the original. Relevant Hx: Course: Daily Update: Today's Plan:stable with meds  Electronically signed by: Baldemar Friday, [place] 07/24/15 1112   Morbid obesity ([place]) 07/22/2015   Other insomnia 11/22/2018   Pneumonia due to COVID-19 virus 01/17/2020   Pulmonary embolus ([place]) 01/18/2020   Screening for osteoporosis 02/27/2017   Formatting of this note might be different from the original. 2019: spine 3.1, L femur 1.3   Seasonal allergic rhinitis due to pollen 07/22/2015   Snores 07/22/2015   Last Assessment &  Plan:  Relevant Hx: Course: Daily Update: Today's Plan:she was to have psg, but has not had this yet. Discussed that if this is present could be worse post-op with pain meds. None the less, she will have protective airway with/during surgery. She should still have psg study, but wants to wait until after knee repair  Electronically signed by: Baldemar Friday, FNP 07/24/15    Past Surgical History:  Procedure  Laterality Date   ABDOMINAL HYSTERECTOMY     CARPAL TUNNEL RELEASE Right    CHOLECYSTECTOMY     ECTOPIC PREGNANCY SURGERY     X 2   FOOT SURGERY     Knee replcement     POLYPECTOMY     TONSILECTOMY/ADENOIDECTOMY WITH MYRINGOTOMY  1972   TOTAL HIP ARTHROPLASTY Right    WISDOM TOOTH EXTRACTION      Family History  Problem Relation Age of Onset   Heart attack Father    Heart disease Father    Dementia Mother    Diabetes Half-Sister    Leukemia Half-Brother    Multiple myeloma Half-Brother     Social History   Socioeconomic History   Marital status: Divorced    Spouse name: NONE   Number of children: 0   Years of education: 3 +  [employer]   Highest education level: Not on file  Occupational History   Occupation: DIABLED  Tobacco Use   Smoking status: Former    Types: Cigarettes    Quit date: 06/16/2006    Years since quitting: 14.5   Smokeless tobacco: Never  Vaping Use   Vaping Use: Never used  Substance and Sexual Activity   Alcohol use: Yes    Alcohol/week: 3.0 - 4.0 standard drinks    Types: 3 - 4 Cans of beer per week   Drug use: No   Sexual activity: Yes  Other Topics Concern   Not on file  Social History Narrative   Not on file   Social Determinants of Health   Financial Resource Strain: Not on file  Food Insecurity: Not on file  Transportation Needs: Not on file  Physical Activity: Not on file  Stress: Not on file  Social Connections: Not on file  Intimate Partner Violence: Not on file    Outpatient Medications Prior to Visit  Medication Sig Dispense Refill   albuterol (VENTOLIN HFA) 108 (90 Base) MCG/ACT inhaler Inhale 1-2 puffs into the lungs every 6 (six) hours as needed for wheezing or shortness of breath.     amLODipine (NORVASC) 10 MG tablet Take 1 tablet (10 mg total) by mouth daily. 90 tablet 1   aspirin EC 81 MG tablet Take 81 mg by mouth daily. Swallow whole.     esomeprazole (NEXIUM) 20 MG capsule Take 20 mg by mouth daily at 12  noon.     fluticasone (FLONASE) 50 MCG/ACT nasal spray Place 1 spray into both nostrils daily.     furosemide (LASIX) 20 MG tablet Take 1 tablet (20 mg total) by mouth as needed for fluid or edema. 1 tablet 0   LORazepam (ATIVAN) 0.5 MG tablet Take 1 tablet (0.5 mg total) by mouth daily as needed for anxiety (panic attacks). 30 tablet 1   Multiple Vitamin (MULTIVITAMIN) tablet Take 1 tablet by mouth daily.     nitroGLYCERIN (NITROSTAT) 0.4 MG SL tablet Place 1 tablet (0.4 mg total) under the tongue every 5 (five) minutes as needed for chest pain (x3 total. call ems if chest pain does not  resolve after one, call ems.). 25 tablet 0   ondansetron (ZOFRAN) 4 MG tablet Take 1 tablet (4 mg total) by mouth every 8 (eight) hours as needed for nausea or vomiting. 20 tablet 0   ondansetron (ZOFRAN-ODT) 4 MG disintegrating tablet Take 4 mg by mouth as needed for nausea or vomiting.     rosuvastatin (CRESTOR) 10 MG tablet Take 1 tablet (10 mg total) by mouth daily. 30 tablet 2   sertraline (ZOLOFT) 100 MG tablet TAKE 1 AND 1/2 TABLET BY MOUTH DAILY 105 tablet 0   No facility-administered medications prior to visit.    Allergies  Allergen Reactions   Demerol Anaphylaxis   Meperidine Anaphylaxis   Sulfamethoxazole Itching    With redness to skin    Citalopram Hydrobromide Other (See Comments)    Unknown   Hydrocodone    Hydrocodone-Acetaminophen Other (See Comments)    Unknown   Metoprolol Tartrate Other (See Comments)    Chest discomfort, palpations   Oxycodone-Acetaminophen Other (See Comments)    Unknown   Diltiazem Rash   Metoprolol Tartrate Palpitations    Review of Systems  Constitutional:  Negative for appetite change, fatigue and fever.  HENT:  Positive for ear pain (right ear), postnasal drip, rhinorrhea and sinus pain. Negative for congestion, sinus pressure and sore throat.   Eyes:  Positive for visual disturbance (wears prescription glasses). Negative for pain.  Respiratory:   Negative for cough, chest tightness, shortness of breath and wheezing.   Cardiovascular:  Negative for chest pain and palpitations.  Gastrointestinal:  Negative for abdominal pain, constipation, diarrhea, nausea and vomiting.  Endocrine: Negative.   Genitourinary:  Negative for dysuria and hematuria.  Musculoskeletal:  Negative for arthralgias, back pain, joint swelling and myalgias.  Skin:  Negative for rash.  Allergic/Immunologic: Positive for environmental allergies.  Neurological:  Positive for headaches. Negative for dizziness and weakness.  Hematological:  Bruises/bleeds easily.  Psychiatric/Behavioral:  Negative for dysphoric mood. The patient is not nervous/anxious.       Objective:    Physical Exam Vitals reviewed.  Constitutional:      Appearance: Normal appearance. She is well-developed.  HENT:     Right Ear: Tympanic membrane, ear canal and external ear normal.     Left Ear: Tympanic membrane, ear canal and external ear normal.     Nose: Nose normal.     Mouth/Throat:     Mouth: Mucous membranes are moist.  Eyes:     General: No visual field deficit.    Extraocular Movements: Extraocular movements intact.     Right eye: No nystagmus.     Left eye: No nystagmus.     Pupils: Pupils are equal, round, and reactive to light.  Cardiovascular:     Rate and Rhythm: Normal rate and regular rhythm.     Pulses: Normal pulses.     Heart sounds: Normal heart sounds.  Pulmonary:     Effort: Pulmonary effort is normal.     Breath sounds: Normal breath sounds.  Abdominal:     Palpations: Abdomen is soft.  Musculoskeletal:        General: Normal range of motion.     Cervical back: Normal range of motion. No rigidity.  Skin:    General: Skin is warm and dry.     Capillary Refill: Capillary refill takes less than 2 seconds.  Neurological:     Mental Status: She is alert and oriented to person, place, and time.     Cranial Nerves: No  facial asymmetry.     Sensory: No sensory  deficit.     Motor: No weakness.     Coordination: Romberg sign negative. Coordination normal.     Gait: Gait normal.  Psychiatric:        Mood and Affect: Mood normal.        Behavior: Behavior normal.        Thought Content: Thought content normal.        Cognition and Memory: Cognition is not impaired. Memory is not impaired.        Judgment: Judgment normal.    BP (!) 138/92 (BP Location: Left Arm, Patient Position: Sitting)   Pulse 94   Temp (!) 97.5 F (36.4 C) (Temporal)   Ht $R'5\' 3"'BG$  (1.6 m)   Wt 219 lb (99.3 kg)   SpO2 97%   BMI 38.79 kg/m   BP (!) 130/94   Pulse 94   Temp (!) 97.5 F (36.4 C) (Temporal)   Ht $R'5\' 3"'NL$  (1.6 m)   Wt 219 lb (99.3 kg)   SpO2 97%   BMI 38.79 kg/m   Wt Readings from Last 3 Encounters:  01/07/21 219 lb (99.3 kg)  11/23/20 218 lb 3.2 oz (99 kg)  11/02/20 219 lb (99.3 kg)    Health Maintenance Due  Topic Date Due   COVID-19 Vaccine (1) Never done   HIV Screening  Never done   Hepatitis C Screening  Never done   TETANUS/TDAP  Never done   Zoster Vaccines- Shingrix (1 of 2) Never done   PAP SMEAR-Modifier  Never done    Lab Results  Component Value Date   TSH 1.550 11/23/2020   Lab Results  Component Value Date   WBC 4.5 11/23/2020   HGB 14.4 11/23/2020   HCT 41.5 11/23/2020   MCV 99 (H) 11/23/2020   PLT 327 11/23/2020   Lab Results  Component Value Date   NA 138 11/23/2020   K 4.6 11/23/2020   CO2 28 11/23/2020   GLUCOSE 110 (H) 11/23/2020   BUN 11 11/23/2020   CREATININE 0.66 11/23/2020   BILITOT 0.5 11/23/2020   ALKPHOS 109 11/23/2020   AST 21 11/23/2020   ALT 27 11/23/2020   PROT 6.5 11/23/2020   ALBUMIN 4.5 11/23/2020   CALCIUM 9.8 11/23/2020   EGFR 104 11/23/2020   Lab Results  Component Value Date   CHOL 326 (H) 11/23/2020   Lab Results  Component Value Date   HDL 146 11/23/2020   Lab Results  Component Value Date   LDLCALC 172 (H) 11/23/2020   Lab Results  Component Value Date   TRIG 62  11/23/2020   Lab Results  Component Value Date   CHOLHDL 2.2 11/23/2020   Lab Results  Component Value Date   HGBA1C 5.0 11/23/2020         Assessment & Plan:   1. New daily persistent headache - Comprehensive metabolic panel - CBC with Differential/Platelet  2. Other acute sinusitis, recurrence not specified - amoxicillin (AMOXIL) 500 MG capsule; Take 1 capsule (500 mg total) by mouth 2 (two) times daily for 10 days.  Dispense: 14 capsule; Refill: 0  3. Acute otalgia, right -tylenol as directed  4. Uncontrolled hypertension  -Monitor BP at home -Amlodipine 10 mg daily -DASH diet  Rest and push fluids Continue Tylenol as needed Keep headache record Use Flonase nasal spray daily Resume Xyzal allergy medication Take Amoxicillin twice daily for 10 days Follow-up if no improvement in 2 weeks  I,Lauren M Auman,acting as a Education administrator for [employer], NP.,have documented all relevant documentation on the behalf of Rip Harbour, NP,as directed by  Rip Harbour, NP while in the presence of Rip Harbour, NP.   I, Rip Harbour, NP, have reviewed all documentation for this visit. The documentation on 01/07/21 for the exam, diagnosis, procedures, and orders are all accurate and complete.    Follow-up: PRN  Varney Baas, DNP 01/07/21 at 10:43 pm

## 2021-01-08 LAB — CBC WITH DIFFERENTIAL/PLATELET
Basophils Absolute: 0 10*3/uL (ref 0.0–0.2)
Basos: 1 %
EOS (ABSOLUTE): 0 10*3/uL (ref 0.0–0.4)
Eos: 1 %
Hematocrit: 40.4 % (ref 34.0–46.6)
Hemoglobin: 14.1 g/dL (ref 11.1–15.9)
Immature Grans (Abs): 0 10*3/uL (ref 0.0–0.1)
Immature Granulocytes: 0 %
Lymphocytes Absolute: 1.6 10*3/uL (ref 0.7–3.1)
Lymphs: 30 %
MCH: 34.1 pg — ABNORMAL HIGH (ref 26.6–33.0)
MCHC: 34.9 g/dL (ref 31.5–35.7)
MCV: 98 fL — ABNORMAL HIGH (ref 79–97)
Monocytes Absolute: 0.6 10*3/uL (ref 0.1–0.9)
Monocytes: 12 %
Neutrophils Absolute: 3 10*3/uL (ref 1.4–7.0)
Neutrophils: 56 %
Platelets: 321 10*3/uL (ref 150–450)
RBC: 4.13 x10E6/uL (ref 3.77–5.28)
RDW: 12.1 % (ref 11.7–15.4)
WBC: 5.3 10*3/uL (ref 3.4–10.8)

## 2021-01-08 LAB — COMPREHENSIVE METABOLIC PANEL
ALT: 21 IU/L (ref 0–32)
AST: 23 IU/L (ref 0–40)
Albumin/Globulin Ratio: 2.5 — ABNORMAL HIGH (ref 1.2–2.2)
Albumin: 4.8 g/dL (ref 3.8–4.9)
Alkaline Phosphatase: 99 IU/L (ref 44–121)
BUN/Creatinine Ratio: 21 (ref 9–23)
BUN: 14 mg/dL (ref 6–24)
Bilirubin Total: 0.4 mg/dL (ref 0.0–1.2)
CO2: 25 mmol/L (ref 20–29)
Calcium: 10 mg/dL (ref 8.7–10.2)
Chloride: 99 mmol/L (ref 96–106)
Creatinine, Ser: 0.66 mg/dL (ref 0.57–1.00)
Globulin, Total: 1.9 g/dL (ref 1.5–4.5)
Glucose: 95 mg/dL (ref 65–99)
Potassium: 5.1 mmol/L (ref 3.5–5.2)
Sodium: 141 mmol/L (ref 134–144)
Total Protein: 6.7 g/dL (ref 6.0–8.5)
eGFR: 104 mL/min/{1.73_m2} (ref >59)

## 2021-01-14 DIAGNOSIS — I499 Cardiac arrhythmia, unspecified: Secondary | ICD-10-CM | POA: Diagnosis not present

## 2021-01-14 DIAGNOSIS — I252 Old myocardial infarction: Secondary | ICD-10-CM | POA: Diagnosis not present

## 2021-01-14 DIAGNOSIS — Z008 Encounter for other general examination: Secondary | ICD-10-CM | POA: Diagnosis not present

## 2021-01-14 DIAGNOSIS — I10 Essential (primary) hypertension: Secondary | ICD-10-CM | POA: Diagnosis not present

## 2021-01-14 DIAGNOSIS — F411 Generalized anxiety disorder: Secondary | ICD-10-CM | POA: Diagnosis not present

## 2021-01-14 DIAGNOSIS — J309 Allergic rhinitis, unspecified: Secondary | ICD-10-CM | POA: Diagnosis not present

## 2021-01-14 DIAGNOSIS — R69 Illness, unspecified: Secondary | ICD-10-CM | POA: Diagnosis not present

## 2021-01-14 DIAGNOSIS — K219 Gastro-esophageal reflux disease without esophagitis: Secondary | ICD-10-CM | POA: Diagnosis not present

## 2021-01-14 DIAGNOSIS — E785 Hyperlipidemia, unspecified: Secondary | ICD-10-CM | POA: Diagnosis not present

## 2021-01-14 DIAGNOSIS — G8929 Other chronic pain: Secondary | ICD-10-CM | POA: Diagnosis not present

## 2021-02-10 ENCOUNTER — Telehealth: Payer: Self-pay

## 2021-02-10 NOTE — Telephone Encounter (Signed)
Called pt schedule an appt for 02/16/21 @ 10AM.  KP

## 2021-02-16 ENCOUNTER — Ambulatory Visit (INDEPENDENT_AMBULATORY_CARE_PROVIDER_SITE_OTHER): Payer: Medicare HMO

## 2021-02-16 VITALS — Temp 98.7°F | Ht 63.0 in | Wt 222.0 lb

## 2021-02-16 DIAGNOSIS — Z0000 Encounter for general adult medical examination without abnormal findings: Secondary | ICD-10-CM | POA: Diagnosis not present

## 2021-02-16 DIAGNOSIS — Z13820 Encounter for screening for osteoporosis: Secondary | ICD-10-CM

## 2021-02-16 NOTE — Progress Notes (Addendum)
Subjective:   [name] is a 55 y.o. female who presents for Medicare Annual (Subsequent) preventive examination.  I connected with  [name] on 02/16/21 by a Audio enabled telemedicine application and verified that I am speaking with the correct person using two identifiers.   I discussed the limitations of evaluation and management by telemedicine. The patient expressed understanding and agreed to proceed.   Location of patient: Home Location of provider: Home  Review of Systems    Defer to PCP      Objective:    Today's Vitals   02/16/21 0848 02/16/21 0852  Temp: 98.7 F (37.1 C)   TempSrc: Oral   Weight: 222 lb (100.7 kg)   Height: 5\' 3"  (1.6 m)   PainSc: 0-No pain 0-No pain   Body mass index is 39.33 kg/m.  Advanced Directives 02/16/2021 06/07/2020  Does Patient Have a Medical Advance Directive? No No  Would patient like information on creating a medical advance directive? Yes (ED - Information included in AVS) No - Patient declined    Current Medications (verified) Outpatient Encounter Medications as of 02/16/2021  Medication Sig   albuterol (VENTOLIN HFA) 108 (90 Base) MCG/ACT inhaler Inhale 1-2 puffs into the lungs every 6 (six) hours as needed for wheezing or shortness of breath.   amLODipine (NORVASC) 10 MG tablet Take 1 tablet (10 mg total) by mouth daily.   aspirin EC 81 MG tablet Take 81 mg by mouth daily. Swallow whole.   esomeprazole (NEXIUM) 20 MG capsule Take 20 mg by mouth daily at 12 noon.   fluticasone (FLONASE) 50 MCG/ACT nasal spray Place 1 spray into both nostrils daily.   furosemide (LASIX) 20 MG tablet Take 1 tablet (20 mg total) by mouth as needed for fluid or edema.   LORazepam (ATIVAN) 0.5 MG tablet Take 1 tablet (0.5 mg total) by mouth daily as needed for anxiety (panic attacks).   Multiple Vitamin (MULTIVITAMIN) tablet Take 1 tablet by mouth daily.   nitroGLYCERIN (NITROSTAT) 0.4 MG SL tablet Place 1 tablet (0.4 mg total) under the  tongue every 5 (five) minutes as needed for chest pain (x3 total. call ems if chest pain does not resolve after one, call ems.).   ondansetron (ZOFRAN) 4 MG tablet Take 1 tablet (4 mg total) by mouth every 8 (eight) hours as needed for nausea or vomiting.   rosuvastatin (CRESTOR) 10 MG tablet Take 1 tablet (10 mg total) by mouth daily.   sertraline (ZOLOFT) 100 MG tablet TAKE 1 AND 1/2 TABLET BY MOUTH DAILY   [DISCONTINUED] ondansetron (ZOFRAN-ODT) 4 MG disintegrating tablet Take 4 mg by mouth as needed for nausea or vomiting.   No facility-administered encounter medications on file as of 02/16/2021.    Allergies (verified) Demerol, Meperidine, Sulfamethoxazole, Citalopram hydrobromide, Hydrocodone, Hydrocodone-acetaminophen, Metoprolol tartrate, Oxycodone-acetaminophen, Diltiazem, and Metoprolol tartrate   History: Past Medical History:  Diagnosis Date   Allergic reaction to bee sting 09/09/2018   Anxiety    Asthma    Bacterial vaginosis 07/07/2019   Bulging lumbar disc 07/22/2015   Cardiomyopathy (HCC) 07/18/2015   Overview:  Overview:  [place] 2009, normal coronaries  Last Assessment & Plan:  Relevant Hx: Course: Daily Update: Today's Plan: she has recently been seen by cards and is planned to have echo this Friday  Electronically signed by: Friday, FNP 07/24/15 1042   Carpal tunnel syndrome 07/22/2015   Cervical strain 01/11/2019   Chronic fatigue 07/22/2015   Chronic rhinitis 07/22/2015  Depression    Dyslipidemia 07/18/2015   Esophageal stricture 07/22/2015   Essential hypertension    Eustachian tube dysfunction 07/22/2015   Excessive daytime sleepiness 05/16/2019   Gastro-esophageal reflux disease without esophagitis    Gastroesophageal reflux disease without esophagitis 07/18/2015   Last Assessment & Plan:  Relevant Hx: Course: Daily Update: Today's Plan:appears stable with ppi  Electronically signed by: Baldemar Friday, [place] 07/24/15 1043   High risk medication use  07/22/2015   History of blood clots    Hot flash, menopausal 07/22/2015   Hyperlipidemia 07/22/2015   Last Assessment & Plan:  Relevant Hx: Course: Daily Update: Today's Plan:stable with meds  Electronically signed by: Baldemar Friday, [place] 07/24/15 1112   Hypertension    Localized swelling of both lower legs 07/22/2015   Localized swelling of both lower legs 07/22/2015   Low back strain, initial encounter 07/07/2019   Lung mass    Mixed hyperlipidemia 07/22/2015   Last Assessment & Plan:  Formatting of this note might be different from the original. Relevant Hx: Course: Daily Update: Today's Plan:stable with meds  Electronically signed by: Baldemar Friday, [place] 07/24/15 1112   Morbid obesity ([place]) 07/22/2015   Other insomnia 11/22/2018   Pneumonia due to COVID-19 virus 01/17/2020   Pulmonary embolus ([place]) 01/18/2020   Screening for osteoporosis 02/27/2017   Formatting of this note might be different from the original. 2019: spine 3.1, L femur 1.3   Seasonal allergic rhinitis due to pollen 07/22/2015   Snores 07/22/2015   Last Assessment & Plan:  Relevant Hx: Course: Daily Update: Today's Plan:she was to have psg, but has not had this yet. Discussed that if this is present could be worse post-op with pain meds. None the less, she will have protective airway with/during surgery. She should still have psg study, but wants to wait until after knee repair  Electronically signed by: Baldemar Friday, FNP 07/24/15   Past Surgical History:  Procedure Laterality Date   ABDOMINAL HYSTERECTOMY     CARPAL TUNNEL RELEASE Right    CHOLECYSTECTOMY     ECTOPIC PREGNANCY SURGERY     X 2   FOOT SURGERY     Knee replcement     POLYPECTOMY     TONSILECTOMY/ADENOIDECTOMY WITH MYRINGOTOMY  1972   TOTAL HIP ARTHROPLASTY Right    WISDOM TOOTH EXTRACTION     Family History  Problem Relation Age of Onset   Heart attack Father    Heart disease Father    Dementia Mother    Diabetes Half-Sister    Leukemia  Half-Brother    Multiple myeloma Half-Brother        Clinical Intake:  Pre-visit preparation completed: Yes  Pain : No/denies pain Pain Score: 0-No pain     Nutritional Status: BMI > 30  Obese Nutritional Risks: None Diabetes: No  How often do you need to have someone help you when you read instructions, pamphlets, or other written materials from your doctor or pharmacy?: 1 - Never What is the last grade level you completed in school?: Some College  Diabetic?  No  Interpreter Needed?: No      Activities of Daily Living In your present state of health, do you have any difficulty performing the following activities: 02/16/2021  Hearing? N  Vision? N  Difficulty concentrating or making decisions? N  Walking or climbing stairs? N  Dressing or bathing? N  Doing errands, shopping? N  Preparing Food and eating ? N  Using the  Toilet? N  In the past six months, have you accidently leaked urine? N  Do you have problems with loss of bowel control? N  Managing your Medications? N  Managing your Finances? N  Housekeeping or managing your Housekeeping? N  Some recent data might be hidden    Patient Care Team: Rochel Brome, MD as PCP - General (Family Medicine) Park Liter, MD as Consulting Physician (Cardiology)  Indicate any recent Medical Services you may have received from other than [facility] providers in the past year (date may be approximate).     Assessment:   This is a routine wellness examination for [name].  Hearing/Vision screen No results found.  Dietary issues and exercise activities discussed: Current Exercise Habits: The patient does not participate in regular exercise at present, Exercise limited by: None identified   Goals Addressed   None    Depression Screen PHQ 2/9 Scores 02/16/2021 08/22/2020 04/03/2020  PHQ - 2 Score 0 3 4  PHQ- 9 Score - 12 10    Fall Risk Fall Risk  02/16/2021  Falls in the past year? 1  Number falls in past yr: 1   Injury with Fall? 0  Risk for fall due to : History of fall(s)  Follow up Falls prevention discussed       Cognitive Function:     6CIT Screen 02/16/2021  What Year? 0 points  What month? 0 points  What time? 0 points  Count back from 20 0 points  Months in reverse 0 points  Repeat phrase 0 points  Total Score 0    Immunizations Immunization History  Administered Date(s) Administered   Influenza Inj Mdck Quad Pf 04/03/2020   Influenza, Quadrivalent, Recombinant, Inj, Pf 04/17/2017   Influenza,inj,Quad PF,6+ Mos 05/16/2019   Influenza-Unspecified 03/17/2018   Pneumococcal Polysaccharide-23 11/23/2020    TDAP status: Due, Education has been provided regarding the importance of this vaccine. Advised may receive this vaccine at local pharmacy or Health Dept. Aware to provide a copy of the vaccination record if obtained from local pharmacy or Health Dept. Verbalized acceptance and understanding.  Patient has an appointment made for 02/20/21 to get flu vaccine.  Pneumococcal vaccine status: Up to date  Covid-19 vaccine status: Declined, Education has been provided regarding the importance of this vaccine but patient still declined. Advised may receive this vaccine at local pharmacy or Health Dept.or vaccine clinic. Aware to provide a copy of the vaccination record if obtained from local pharmacy or Health Dept. Verbalized acceptance and understanding.  Qualifies for Shingles Vaccine? Yes   Zostavax completed No   Shingrix Completed?: No.    Education has been provided regarding the importance of this vaccine. Patient has been advised to call insurance company to determine out of pocket expense if they have not yet received this vaccine. Advised may also receive vaccine at local pharmacy or Health Dept. Verbalized acceptance and understanding.  Screening Tests Health Maintenance  Topic Date Due   HIV Screening  Never done   Hepatitis C Screening  Never done   PAP SMEAR-Modifier   Never done   INFLUENZA VACCINE  01/14/2021   COVID-19 Vaccine (1) 03/04/2021 (Originally 11/08/1970)   Zoster Vaccines- Shingrix (1 of 2) 05/18/2021 (Originally 11/07/1984)   TETANUS/TDAP  02/16/2022 (Originally [birth year]07/1984)   Pneumococcal Vaccine 44-55 Years old (2 - PCV) 11/23/2021   MAMMOGRAM  01/08/2023   COLONOSCOPY (Pts 45-29yrs Insurance coverage will need to be confirmed)  12/21/2023   HPV VACCINES  Aged Out  Health Maintenance  Health Maintenance Due  Topic Date Due   HIV Screening  Never done   Hepatitis C Screening  Never done   PAP SMEAR-Modifier  Never done   INFLUENZA VACCINE  01/14/2021    Colorectal cancer screening: Type of screening: Colonoscopy. Completed 12/21/2018. Repeat every 5 years  Mammogram status: Completed 01/09/21. Repeat every year  Bone Density status: Ordered 02/16/21. Pt provided with contact info and advised to call to schedule appt.  Lung Cancer Screening: (Low Dose CT Chest recommended if Age 46-80 years, 30 pack-year currently smoking OR have quit w/in 15years.) does not qualify.   Lung Cancer Screening Referral: N/A  Additional Screening:  Hepatitis C Screening: does not qualify; Completed N/A  Vision Screening: Recommended annual ophthalmology exams for early detection of glaucoma and other disorders of the eye. Is the patient up to date with their annual eye exam?  Yes  Who is the provider or what is the name of the office in which the patient attends annual eye exams? [place] [facility] If pt is not established with a provider, would they like to be referred to a provider to establish care?  N/A .   Dental Screening: Recommended annual dental exams for proper oral hygiene  Community Resource Referral / Chronic Care Management: CRR required this visit?  No   CCM required this visit?  No      Plan:     I have personally reviewed and noted the following in the patient's chart:   Medical and social history Use of alcohol,  tobacco or illicit drugs  Current medications and supplements including opioid prescriptions.  Functional ability and status Nutritional status Physical activity Advanced directives List of other physicians Hospitalizations, surgeries, and ER visits in previous 12 months Vitals Screenings to include cognitive, depression, and falls Referrals and appointments  In addition, I have reviewed and discussed with patient certain preventive protocols, quality metrics, and best practice recommendations. A written personalized care plan for preventive services as well as general preventive health recommendations were provided to patient.     Burna Forts, [place]   02/16/2021   Nurse Notes: Non-Face to face 30 minute visit encounter.   [name] , Thank you for taking time to come for your Medicare Wellness Visit. I appreciate your ongoing commitment to your health goals. Please review the following plan we discussed and let me know if I can assist you in the future.   These are the goals we discussed:  Goals   None     This is a list of the screening recommended for you and due dates:  Health Maintenance  Topic Date Due   HIV Screening  Never done   Hepatitis C Screening: USPSTF Recommendation to screen - Ages 81-79 yo.  Never done   Pap Smear  Never done   Flu Shot  01/14/2021   COVID-19 Vaccine (1) 03/04/2021*   Zoster (Shingles) Vaccine (1 of 2) 05/18/2021*   Tetanus Vaccine  02/16/2022*   Pneumococcal Vaccination (2 - PCV) 11/23/2021   Mammogram  01/08/2023   Colon Cancer Screening  12/21/2023   HPV Vaccine  Aged Out  *Topic was postponed. The date shown is not the original due date.     I will assess pt at her upcoming appt as I do not see that she meets criteria for bone density. kc

## 2021-02-20 ENCOUNTER — Ambulatory Visit: Payer: Medicare HMO

## 2021-02-21 ENCOUNTER — Telehealth: Payer: Self-pay | Admitting: Family Medicine

## 2021-02-21 NOTE — Telephone Encounter (Signed)
   [name] has been scheduled for the following appointment:  WHAT: BONE DENSITY WHERE: [facility] DATE: 04/19/21 TIME: 1:30 PM ARRIVAL TIME  Patient has been made aware.

## 2021-02-25 ENCOUNTER — Ambulatory Visit: Payer: Medicare HMO | Admitting: Family Medicine

## 2021-02-26 ENCOUNTER — Ambulatory Visit: Payer: Medicare HMO

## 2021-03-06 ENCOUNTER — Ambulatory Visit (INDEPENDENT_AMBULATORY_CARE_PROVIDER_SITE_OTHER): Payer: Medicare HMO

## 2021-03-06 DIAGNOSIS — Z23 Encounter for immunization: Secondary | ICD-10-CM | POA: Diagnosis not present

## 2021-04-18 ENCOUNTER — Other Ambulatory Visit: Payer: Self-pay

## 2021-04-18 ENCOUNTER — Ambulatory Visit: Payer: Medicare HMO | Admitting: Cardiology

## 2021-04-18 ENCOUNTER — Encounter: Payer: Self-pay | Admitting: Cardiology

## 2021-04-18 VITALS — BP 170/100 | HR 104 | Ht 63.0 in | Wt 220.8 lb

## 2021-04-18 DIAGNOSIS — I10 Essential (primary) hypertension: Secondary | ICD-10-CM

## 2021-04-18 DIAGNOSIS — R2243 Localized swelling, mass and lump, lower limb, bilateral: Secondary | ICD-10-CM | POA: Diagnosis not present

## 2021-04-18 DIAGNOSIS — E782 Mixed hyperlipidemia: Secondary | ICD-10-CM | POA: Diagnosis not present

## 2021-04-18 DIAGNOSIS — I420 Dilated cardiomyopathy: Secondary | ICD-10-CM | POA: Diagnosis not present

## 2021-04-18 NOTE — Patient Instructions (Signed)
Medication Instructions:  Your physician recommends that you continue on your current medications as directed. Please refer to the Current Medication list given to you today.  *If you need a refill on your cardiac medications before your next appointment, please call your pharmacy*   Lab Work: Your physician recommends that you return for lab work in: TODAY Lipids If you have labs (blood work) drawn today and your tests are completely normal, you will receive your results only by: MyChart Message (if you have MyChart) OR A paper copy in the mail If you have any lab test that is abnormal or we need to change your treatment, we will call you to review the results.   Testing/Procedures: None   Follow-Up: At [facility], you and your health needs are our priority.  As part of our continuing mission to provide you with exceptional heart care, we have created designated Provider Care Teams.  These Care Teams include your primary Cardiologist (physician) and Advanced Practice Providers (APPs -  Physician Assistants and Nurse Practitioners) who all work together to provide you with the care you need, when you need it.  We recommend signing up for the patient portal called "MyChart".  Sign up information is provided on this After Visit Summary.  MyChart is used to connect with patients for Virtual Visits (Telemedicine).  Patients are able to view lab/test results, encounter notes, upcoming appointments, etc.  Non-urgent messages can be sent to your provider as well.   To learn more about what you can do with MyChart, go to [email or web address].    Your next appointment:   6 month(s)  The format for your next appointment:   In Person  Provider:   Brian Munley, MD   Other Instructions   

## 2021-04-18 NOTE — Progress Notes (Signed)
Cardiology Office Note:    Date:  04/18/2021   ID:  [name], DOB [birth year], MRN [id number]  PCP:  Rochel Brome, MD  Cardiologist:  Jenne Campus, MD    Referring MD: Rochel Brome, MD   No chief complaint on file. Doing well  History of Present Illness:    [name] is a 55 y.o. female with past medical history significant for cardiomyopathy however normalization, last echocardiogram done in May 23, 2020 showing normal left ventricle ejection fraction, essential hypertension, dyslipidemia with intolerance to multiple statin. She comes today 2 months of follow-up overall she is doing well.  She is asymptomatic.  There is no chest pain tightness squeezing pressure burning chest she is busy remodeling her apartment.  Past Medical History:  Diagnosis Date   Allergic reaction to bee sting 09/09/2018   Anxiety    Asthma    Bacterial vaginosis 07/07/2019   Bulging lumbar disc 07/22/2015   Cardiomyopathy ([place]) 07/18/2015   Overview:  Overview:  Taka Tsubo 2009, normal coronaries  Last Assessment & Plan:  Relevant Hx: Course: Daily Update: Today's Plan: she has recently been seen by cards and is planned to have echo this Friday  Electronically signed by: Baldemar Friday, [place] 07/24/15 1042   Carpal tunnel syndrome 07/22/2015   Cervical strain 01/11/2019   Chronic fatigue 07/22/2015   Chronic rhinitis 07/22/2015   Depression    Dyslipidemia 07/18/2015   Esophageal stricture 07/22/2015   Essential hypertension    Eustachian tube dysfunction 07/22/2015   Excessive daytime sleepiness 05/16/2019   Gastro-esophageal reflux disease without esophagitis    Gastroesophageal reflux disease without esophagitis 07/18/2015   Last Assessment & Plan:  Relevant Hx: Course: Daily Update: Today's Plan:appears stable with ppi  Electronically signed by: Baldemar Friday, [place] 07/24/15 1043   High risk medication use 07/22/2015   History of blood clots    Hot flash, menopausal 07/22/2015    Hyperlipidemia 07/22/2015   Last Assessment & Plan:  Relevant Hx: Course: Daily Update: Today's Plan:stable with meds  Electronically signed by: Baldemar Friday, [place] 07/24/15 1112   Hypertension    Localized swelling of both lower legs 07/22/2015   Localized swelling of both lower legs 07/22/2015   Low back strain, initial encounter 07/07/2019   Lung mass    Mixed hyperlipidemia 07/22/2015   Last Assessment & Plan:  Formatting of this note might be different from the original. Relevant Hx: Course: Daily Update: Today's Plan:stable with meds  Electronically signed by: Baldemar Friday, [place] 07/24/15 1112   Morbid obesity ([place]) 07/22/2015   Other insomnia 11/22/2018   Pneumonia due to COVID-19 virus 01/17/2020   Pulmonary embolus ([place]) 01/18/2020   Screening for osteoporosis 02/27/2017   Formatting of this note might be different from the original. 2019: spine 3.1, L femur 1.3   Seasonal allergic rhinitis due to pollen 07/22/2015   Snores 07/22/2015   Last Assessment & Plan:  Relevant Hx: Course: Daily Update: Today's Plan:she was to have psg, but has not had this yet. Discussed that if this is present could be worse post-op with pain meds. None the less, she will have protective airway with/during surgery. She should still have psg study, but wants to wait until after knee repair  Electronically signed by: Baldemar Friday, FNP 07/24/15    Past Surgical History:  Procedure Laterality Date   ABDOMINAL HYSTERECTOMY     CARPAL TUNNEL RELEASE Right    CHOLECYSTECTOMY     ECTOPIC  PREGNANCY SURGERY     X 2   FOOT SURGERY     Knee replcement     POLYPECTOMY     TONSILECTOMY/ADENOIDECTOMY WITH MYRINGOTOMY  1972   TOTAL HIP ARTHROPLASTY Right    WISDOM TOOTH EXTRACTION      Current Medications: Current Meds  Medication Sig   albuterol (VENTOLIN HFA) 108 (90 Base) MCG/ACT inhaler Inhale 1-2 puffs into the lungs every 6 (six) hours as needed for wheezing or shortness of breath.   amLODipine  (NORVASC) 10 MG tablet Take 1 tablet (10 mg total) by mouth daily.   aspirin EC 81 MG tablet Take 81 mg by mouth daily. Swallow whole.   esomeprazole (NEXIUM) 20 MG capsule Take 20 mg by mouth daily at 12 noon.   fluticasone (FLONASE) 50 MCG/ACT nasal spray Place 1 spray into both nostrils daily.   furosemide (LASIX) 20 MG tablet Take 1 tablet (20 mg total) by mouth as needed for fluid or edema.   LORazepam (ATIVAN) 0.5 MG tablet Take 1 tablet (0.5 mg total) by mouth daily as needed for anxiety (panic attacks).   Multiple Vitamin (MULTIVITAMIN) tablet Take 1 tablet by mouth daily.   nitroGLYCERIN (NITROSTAT) 0.4 MG SL tablet Place 1 tablet (0.4 mg total) under the tongue every 5 (five) minutes as needed for chest pain (x3 total. call ems if chest pain does not resolve after one, call ems.).   ondansetron (ZOFRAN) 4 MG tablet Take 1 tablet (4 mg total) by mouth every 8 (eight) hours as needed for nausea or vomiting.   sertraline (ZOLOFT) 100 MG tablet TAKE 1 AND 1/2 TABLET BY MOUTH DAILY     Allergies:   Demerol, Meperidine, Sulfamethoxazole, Citalopram hydrobromide, Hydrocodone, Hydrocodone-acetaminophen, Metoprolol tartrate, Oxycodone-acetaminophen, Diltiazem, and Metoprolol tartrate   Social History   Socioeconomic History   Marital status: Divorced    Spouse name: NONE   Number of children: 0   Years of education: 12 +  [employer]   Highest education level: Not on file  Occupational History   Occupation: DIABLED  Tobacco Use   Smoking status: Former    Types: Cigarettes    Quit date: 06/16/2006    Years since quitting: 14.8   Smokeless tobacco: Never  Vaping Use   Vaping Use: Never used  Substance and Sexual Activity   Alcohol use: Yes    Alcohol/week: 3.0 - 4.0 standard drinks    Types: 3 - 4 Cans of beer per week   Drug use: No   Sexual activity: Yes  Other Topics Concern   Not on file  Social History Narrative   Not on file   Social Determinants of Health    Financial Resource Strain: Low Risk    Difficulty of Paying Living Expenses: Not hard at all  Food Insecurity: No Food Insecurity   Worried About Charity fundraiser in the Last Year: Never true   [place] in the Last Year: Never true  Transportation Needs: No Transportation Needs   Lack of Transportation (Medical): No   Lack of Transportation (Non-Medical): No  Physical Activity: Inactive   Days of Exercise per Week: 0 days   Minutes of Exercise per Session: 0 min  Stress: No Stress Concern Present   Feeling of Stress : Not at all  Social Connections: Socially Isolated   Frequency of Communication with Friends and Family: More than three times a week   Frequency of Social Gatherings with Friends and Family: Twice a week  Attends Religious Services: Never   Active Member of Clubs or Organizations: No   Attends Archivist Meetings: Never   Marital Status: Divorced     Family History: The patient's family history includes Dementia in her mother; Diabetes in her half-sister; Heart attack in her father; Heart disease in her father; Leukemia in her half-brother; Multiple myeloma in her half-brother. ROS:   Please see the history of present illness.    All 14 point review of systems negative except as described per history of present illness  EKGs/Labs/Other Studies Reviewed:      Recent Labs: 11/23/2020: TSH 1.550 01/07/2021: ALT 21; BUN 14; Creatinine, Ser 0.66; Hemoglobin 14.1; Platelets 321; Potassium 5.1; Sodium 141  Recent Lipid Panel    Component Value Date/Time   CHOL 326 (H) 11/23/2020 1022   TRIG 62 11/23/2020 1022   HDL 146 11/23/2020 1022   CHOLHDL 2.2 11/23/2020 1022   LDLCALC 172 (H) 11/23/2020 1022    Physical Exam:    VS:  BP (!) 170/100 (BP Location: Right Arm, Patient Position: Sitting, Cuff Size: Normal)   Pulse (!) 104   Ht $R'5\' 3"'ic$  (1.6 m)   Wt 220 lb 12.8 oz (100.2 kg)   SpO2 98%   BMI 39.11 kg/m     Wt Readings from Last 3  Encounters:  04/18/21 220 lb 12.8 oz (100.2 kg)  02/16/21 222 lb (100.7 kg)  01/07/21 219 lb (99.3 kg)     GEN:  Well nourished, well developed in no acute distress HEENT: Normal NECK: No JVD; No carotid bruits LYMPHATICS: No lymphadenopathy CARDIAC: RRR, no murmurs, no rubs, no gallops RESPIRATORY:  Clear to auscultation without rales, wheezing or rhonchi  ABDOMEN: Soft, non-tender, non-distended MUSCULOSKELETAL:  No edema; No deformity  SKIN: Warm and dry LOWER EXTREMITIES: no swelling NEUROLOGIC:  Alert and oriented x 3 PSYCHIATRIC:  Normal affect   ASSESSMENT:    1. Localized swelling of both lower legs   2. Mixed hyperlipidemia   3. Essential hypertension   4. Dilated cardiomyopathy (HCC)    PLAN:    In order of problems listed above:  Swelling of lower extremities doing well does not have any Mixed dyslipidemia with intolerance to multiple statin we will check fasting lipid profile today and she will be referred to lipid clinic Essential hypertension: Looks like under control however she claimed at her home her blood pressure is good I asked her to check blood pressure at home and bring results to me. History of cardiomyopathy now with normalization   Medication Adjustments/Labs and Tests Ordered: Current medicines are reviewed at length with the patient today.  Concerns regarding medicines are outlined above.  Orders Placed This Encounter  Procedures   Lipid panel   Medication changes: No orders of the defined types were placed in this encounter.   Signed, Park Liter, MD, [facility] 04/18/2021 4:22 PM    [place]

## 2021-04-19 LAB — LIPID PANEL
Chol/HDL Ratio: 2.5 ratio (ref 0.0–4.4)
Cholesterol, Total: 300 mg/dL — ABNORMAL HIGH (ref 100–199)
HDL: 120 mg/dL (ref >39)
LDL Chol Calc (NIH): 157 mg/dL — ABNORMAL HIGH (ref 0–99)
Triglycerides: 137 mg/dL (ref 0–149)
VLDL Cholesterol Cal: 23 mg/dL (ref 5–40)

## 2021-04-19 NOTE — Addendum Note (Signed)
Addended by: Delorse Limber I on: 04/19/2021 11:07 AM   Modules accepted: Orders

## 2021-04-22 ENCOUNTER — Telehealth: Payer: Self-pay | Admitting: Emergency Medicine

## 2021-04-22 DIAGNOSIS — E782 Mixed hyperlipidemia: Secondary | ICD-10-CM

## 2021-04-22 NOTE — Telephone Encounter (Signed)
Patient informed of results. Referral placed.  

## 2021-04-22 NOTE — Telephone Encounter (Signed)
-----   Message from Dawn Lea, MD sent at 04/22/2021 11:06 AM EST ----- Cholesterol still poorly controlled, she most likely required more aggressive approach to this.  Please refer her to lipid clinic

## 2021-05-18 ENCOUNTER — Other Ambulatory Visit: Payer: Self-pay | Admitting: Family Medicine

## 2021-05-18 DIAGNOSIS — F331 Major depressive disorder, recurrent, moderate: Secondary | ICD-10-CM

## 2021-05-18 DIAGNOSIS — F411 Generalized anxiety disorder: Secondary | ICD-10-CM

## 2021-05-28 DIAGNOSIS — I83893 Varicose veins of bilateral lower extremities with other complications: Secondary | ICD-10-CM | POA: Diagnosis not present

## 2021-05-29 ENCOUNTER — Other Ambulatory Visit: Payer: Self-pay | Admitting: Family Medicine

## 2021-05-30 MED ORDER — FUROSEMIDE 20 MG PO TABS: 20.0000 mg | ORAL_TABLET | ORAL | 0 refills | Status: DC | PRN

## 2021-06-05 DIAGNOSIS — M1812 Unilateral primary osteoarthritis of first carpometacarpal joint, left hand: Secondary | ICD-10-CM | POA: Diagnosis not present

## 2021-06-05 DIAGNOSIS — G5602 Carpal tunnel syndrome, left upper limb: Secondary | ICD-10-CM | POA: Diagnosis not present

## 2021-06-13 ENCOUNTER — Encounter: Payer: Self-pay | Admitting: Physician Assistant

## 2021-06-13 ENCOUNTER — Ambulatory Visit (INDEPENDENT_AMBULATORY_CARE_PROVIDER_SITE_OTHER): Payer: Medicare HMO | Admitting: Physician Assistant

## 2021-06-13 VITALS — BP 118/72 | HR 103 | Temp 99.7°F | Resp 20 | Ht 63.0 in | Wt 216.2 lb

## 2021-06-13 DIAGNOSIS — J40 Bronchitis, not specified as acute or chronic: Secondary | ICD-10-CM | POA: Diagnosis not present

## 2021-06-13 LAB — POCT INFLUENZA A/B
Influenza A, POC: NEGATIVE
Influenza B, POC: NEGATIVE

## 2021-06-13 LAB — POC COVID19 BINAXNOW: SARS Coronavirus 2 Ag: NEGATIVE

## 2021-06-13 MED ORDER — PREDNISONE 20 MG PO TABS: ORAL_TABLET | ORAL | 0 refills | Status: AC

## 2021-06-13 MED ORDER — AZITHROMYCIN 250 MG PO TABS: ORAL_TABLET | ORAL | 0 refills | Status: DC

## 2021-06-13 MED ORDER — PROMETHAZINE-DM 6.25-15 MG/5ML PO SYRP: 5.0000 mL | ORAL_SOLUTION | Freq: Four times a day (QID) | ORAL | 0 refills | Status: DC | PRN

## 2021-06-13 NOTE — Progress Notes (Signed)
Acute Office Visit  Subjective:    Patient ID: [name], female    DOB: 05-Aug-1965, 55 y.o.   MRN: [id number]  Chief Complaint  Patient presents with   Cough   Fever    HPI: Patient is in today for complaints of cough, congestion and low grade fever since yesterday - states cough is productive of thick phlegm She has had some sore throat and sinus congestion Has had exposure to people with similar symptoms Pt is vaping  Past Medical History:  Diagnosis Date   Allergic reaction to bee sting 09/09/2018   Anxiety    Asthma    Bacterial vaginosis 07/07/2019   Bulging lumbar disc 07/22/2015   Cardiomyopathy ([place]) 07/18/2015   Overview:  Overview:  Taka Tsubo 2009, normal coronaries  Last Assessment & Plan:  Relevant Hx: Course: Daily Update: Today's Plan: she has recently been seen by cards and is planned to have echo this Friday  Electronically signed by: Baldemar Friday, [place] 07/24/15 1042   Carpal tunnel syndrome 07/22/2015   Cervical strain 01/11/2019   Chronic fatigue 07/22/2015   Chronic rhinitis 07/22/2015   Depression    Dyslipidemia 07/18/2015   Esophageal stricture 07/22/2015   Essential hypertension    Eustachian tube dysfunction 07/22/2015   Excessive daytime sleepiness 05/16/2019   Gastro-esophageal reflux disease without esophagitis    Gastroesophageal reflux disease without esophagitis 07/18/2015   Last Assessment & Plan:  Relevant Hx: Course: Daily Update: Today's Plan:appears stable with ppi  Electronically signed by: Baldemar Friday, [place] 07/24/15 1043   High risk medication use 07/22/2015   History of blood clots    Hot flash, menopausal 07/22/2015   Hyperlipidemia 07/22/2015   Last Assessment & Plan:  Relevant Hx: Course: Daily Update: Today's Plan:stable with meds  Electronically signed by: Baldemar Friday, [place] 07/24/15 1112   Hypertension    Localized swelling of both lower legs 07/22/2015   Localized swelling of both lower legs 07/22/2015   Low back  strain, initial encounter 07/07/2019   Lung mass    Mixed hyperlipidemia 07/22/2015   Last Assessment & Plan:  Formatting of this note might be different from the original. Relevant Hx: Course: Daily Update: Today's Plan:stable with meds  Electronically signed by: Baldemar Friday, [place] 07/24/15 1112   Morbid obesity ([place]) 07/22/2015   Other insomnia 11/22/2018   Pneumonia due to COVID-19 virus 01/17/2020   Pulmonary embolus ([place]) 01/18/2020   Screening for osteoporosis 02/27/2017   Formatting of this note might be different from the original. 2019: spine 3.1, L femur 1.3   Seasonal allergic rhinitis due to pollen 07/22/2015   Snores 07/22/2015   Last Assessment & Plan:  Relevant Hx: Course: Daily Update: Today's Plan:she was to have psg, but has not had this yet. Discussed that if this is present could be worse post-op with pain meds. None the less, she will have protective airway with/during surgery. She should still have psg study, but wants to wait until after knee repair  Electronically signed by: Baldemar Friday, FNP 07/24/15    Past Surgical History:  Procedure Laterality Date   ABDOMINAL HYSTERECTOMY     CARPAL TUNNEL RELEASE Right    CHOLECYSTECTOMY     ECTOPIC PREGNANCY SURGERY     X 2   FOOT SURGERY     Knee replcement     POLYPECTOMY     TONSILECTOMY/ADENOIDECTOMY WITH MYRINGOTOMY  1972   TOTAL HIP ARTHROPLASTY Right  WISDOM TOOTH EXTRACTION      Family History  Problem Relation Age of Onset   Heart attack Father    Heart disease Father    Dementia Mother    Diabetes Half-Sister    Leukemia Half-Brother    Multiple myeloma Half-Brother     Social History   Socioeconomic History   Marital status: Divorced    Spouse name: NONE   Number of children: 0   Years of education: 68 +  [employer]   Highest education level: Not on file  Occupational History   Occupation: DIABLED  Tobacco Use   Smoking status: Former    Types: Cigarettes    Quit date: 06/16/2006     Years since quitting: 15.0   Smokeless tobacco: Never  Vaping Use   Vaping Use: Never used  Substance and Sexual Activity   Alcohol use: Yes    Alcohol/week: 3.0 - 4.0 standard drinks    Types: 3 - 4 Cans of beer per week   Drug use: No   Sexual activity: Yes  Other Topics Concern   Not on file  Social History Narrative   Not on file   Social Determinants of Health   Financial Resource Strain: Low Risk    Difficulty of Paying Living Expenses: Not hard at all  Food Insecurity: No Food Insecurity   Worried About Charity fundraiser in the Last Year: Never true   [place] in the Last Year: Never true  Transportation Needs: No Transportation Needs   Lack of Transportation (Medical): No   Lack of Transportation (Non-Medical): No  Physical Activity: Inactive   Days of Exercise per Week: 0 days   Minutes of Exercise per Session: 0 min  Stress: No Stress Concern Present   Feeling of Stress : Not at all  Social Connections: Socially Isolated   Frequency of Communication with Friends and Family: More than three times a week   Frequency of Social Gatherings with Friends and Family: Twice a week   Attends Religious Services: Never   Marine scientist or Organizations: No   Attends Music therapist: Never   Marital Status: Divorced  Human resources officer Violence: Not At Risk   Fear of Current or Ex-Partner: No   Emotionally Abused: No   Physically Abused: No   Sexually Abused: No    Outpatient Medications Prior to Visit  Medication Sig Dispense Refill   albuterol (VENTOLIN HFA) 108 (90 Base) MCG/ACT inhaler Inhale 1-2 puffs into the lungs every 6 (six) hours as needed for wheezing or shortness of breath.     amLODipine (NORVASC) 10 MG tablet Take 1 tablet (10 mg total) by mouth daily. 90 tablet 1   aspirin EC 81 MG tablet Take 81 mg by mouth daily. Swallow whole.     esomeprazole (NEXIUM) 20 MG capsule Take 20 mg by mouth daily at 12 noon.     fluticasone  (FLONASE) 50 MCG/ACT nasal spray Place 1 spray into both nostrils daily.     furosemide (LASIX) 20 MG tablet Take 1 tablet (20 mg total) by mouth as needed for fluid or edema. 30 tablet 0   LORazepam (ATIVAN) 0.5 MG tablet Take 1 tablet (0.5 mg total) by mouth daily as needed for anxiety (panic attacks). 30 tablet 1   Multiple Vitamin (MULTIVITAMIN) tablet Take 1 tablet by mouth daily.     nitroGLYCERIN (NITROSTAT) 0.4 MG SL tablet Place 1 tablet (0.4 mg total) under the tongue  every 5 (five) minutes as needed for chest pain (x3 total. call ems if chest pain does not resolve after one, call ems.). 25 tablet 0   ondansetron (ZOFRAN) 4 MG tablet Take 1 tablet (4 mg total) by mouth every 8 (eight) hours as needed for nausea or vomiting. 20 tablet 0   rosuvastatin (CRESTOR) 10 MG tablet Take 1 tablet (10 mg total) by mouth daily. (Patient not taking: Reported on 04/18/2021) 30 tablet 2   sertraline (ZOLOFT) 100 MG tablet TAKE 1- 1/2 TABLET BY MOUTH ONCE DAILY. 105 tablet 0   No facility-administered medications prior to visit.    Allergies  Allergen Reactions   Demerol Anaphylaxis   Meperidine Anaphylaxis   Sulfamethoxazole Itching    With redness to skin    Citalopram Hydrobromide Other (See Comments)    Unknown   Hydrocodone    Hydrocodone-Acetaminophen Other (See Comments)    Unknown   Metoprolol Tartrate Other (See Comments)    Chest discomfort, palpations   Oxycodone-Acetaminophen Other (See Comments)    Unknown   Diltiazem Rash   Metoprolol Tartrate Palpitations    Review of Systems CONSTITUTIONAL:see HPI E/N/T: see HPI CARDIOVASCULAR: Negative for chest pain, dizziness, palpitations and pedal edema.  RESPIRATORY: see HPI GASTROINTESTINAL: Negative for abdominal pain, acid reflux symptoms, constipation, diarrhea, nausea and vomiting.  MSK: Negative for arthralgias and myalgias.  INTEGUMENTARY: Negative for rash.       Objective:    Physical Exam PHYSICAL EXAM:   VS: BP  118/72    Pulse (!) 103    Temp 99.7 F (37.6 C)    Resp 20    Ht 5' 3" (1.6 m)    Wt 216 lb 3.2 oz (98.1 kg)    SpO2 94%    BMI 38.30 kg/m   GEN: Well nourished, well developed, in no acute distress  HEENT: normal external ears and nose - normal external auditory canals and TMS - - Lips, Teeth and Gums - normal  Oropharynx - erythema/pnd Cardiac: RRR; no murmurs, rubs, or gallops,no edema  Respiratory:  faint exp rhonchi - clears with coughing  Office Visit on 06/13/2021  Component Date Value Ref Range Status   SARS Coronavirus 2 Ag 06/13/2021 Negative  Negative Final   Influenza A, POC 06/13/2021 Negative  Negative Final   Influenza B, POC 06/13/2021 Negative  Negative Final     BP 118/72    Pulse (!) 103    Temp 99.7 F (37.6 C)    Resp 20    Ht 5' 3" (1.6 m)    Wt 216 lb 3.2 oz (98.1 kg)    SpO2 94%    BMI 38.30 kg/m  Wt Readings from Last 3 Encounters:  06/13/21 216 lb 3.2 oz (98.1 kg)  04/18/21 220 lb 12.8 oz (100.2 kg)  02/16/21 222 lb (100.7 kg)    Health Maintenance Due  Topic Date Due   COVID-19 Vaccine (1) Never done   HIV Screening  Never done   Hepatitis C Screening  Never done   Zoster Vaccines- Shingrix (1 of 2) Never done   PAP SMEAR-Modifier  Never done    There are no preventive care reminders to display for this patient.   Lab Results  Component Value Date   TSH 1.550 11/23/2020   Lab Results  Component Value Date   WBC 5.3 01/07/2021   HGB 14.1 01/07/2021   HCT 40.4 01/07/2021   MCV 98 (H) 01/07/2021   PLT 321 01/07/2021  Lab Results  Component Value Date   NA 141 01/07/2021   K 5.1 01/07/2021   CO2 25 01/07/2021   GLUCOSE 95 01/07/2021   BUN 14 01/07/2021   CREATININE 0.66 01/07/2021   BILITOT 0.4 01/07/2021   ALKPHOS 99 01/07/2021   AST 23 01/07/2021   ALT 21 01/07/2021   PROT 6.7 01/07/2021   ALBUMIN 4.8 01/07/2021   CALCIUM 10.0 01/07/2021   EGFR 104 01/07/2021   Lab Results  Component Value Date   CHOL 300 (H) 04/18/2021    Lab Results  Component Value Date   HDL 120 04/18/2021   Lab Results  Component Value Date   LDLCALC 157 (H) 04/18/2021   Lab Results  Component Value Date   TRIG 137 04/18/2021   Lab Results  Component Value Date   CHOLHDL 2.5 04/18/2021   Lab Results  Component Value Date   HGBA1C 5.0 11/23/2020       Assessment & Plan:   Problem List Items Addressed This Visit       Respiratory   Bronchitis - Primary   Relevant Medications   azithromycin (ZITHROMAX) 250 MG tablet   predniSONE (DELTASONE) 20 MG tablet   promethazine-dextromethorphan (PROMETHAZINE-DM) 6.25-15 MG/5ML syrup   Other Relevant Orders   POC COVID-19 (Completed)   Influenza A/B (Completed)   Meds ordered this encounter  Medications   azithromycin (ZITHROMAX) 250 MG tablet    Sig: Take 2 tablets on day 1, then 1 tablet daily on days 2 through 5    Dispense:  6 tablet    Refill:  0    Order Specific Question:   Supervising Provider    Answer:   Rochel Brome [[id number]]   predniSONE (DELTASONE) 20 MG tablet    Sig: Take 3 tablets (60 mg total) by mouth daily with breakfast for 3 days, THEN 2 tablets (40 mg total) daily with breakfast for 3 days, THEN 1 tablet (20 mg total) daily with breakfast for 3 days.    Dispense:  18 tablet    Refill:  0    Order Specific Question:   Supervising Provider    Answer:   Shelton Silvas   promethazine-dextromethorphan (PROMETHAZINE-DM) 6.25-15 MG/5ML syrup    Sig: Take 5 mLs by mouth 4 (four) times daily as needed for cough.    Dispense:  118 mL    Refill:  0    Order Specific Question:   Supervising Provider    AnswerShelton Silvas    Orders Placed This Encounter  Procedures   POC COVID-19   Influenza A/B     Follow-up: Return if symptoms worsen or fail to improve.  An After Visit Summary was printed and given to the patient.  Yetta Flock [facility] [phone]

## 2021-06-18 ENCOUNTER — Other Ambulatory Visit: Payer: Self-pay | Admitting: Physician Assistant

## 2021-06-18 ENCOUNTER — Telehealth: Payer: Self-pay

## 2021-06-18 DIAGNOSIS — R5383 Other fatigue: Secondary | ICD-10-CM | POA: Diagnosis not present

## 2021-06-18 DIAGNOSIS — R059 Cough, unspecified: Secondary | ICD-10-CM | POA: Diagnosis not present

## 2021-06-18 DIAGNOSIS — J40 Bronchitis, not specified as acute or chronic: Secondary | ICD-10-CM

## 2021-06-18 DIAGNOSIS — J209 Acute bronchitis, unspecified: Secondary | ICD-10-CM | POA: Diagnosis not present

## 2021-06-18 DIAGNOSIS — R509 Fever, unspecified: Secondary | ICD-10-CM | POA: Diagnosis not present

## 2021-06-18 MED ORDER — AZITHROMYCIN 250 MG PO TABS: ORAL_TABLET | ORAL | 0 refills | Status: AC

## 2021-06-18 NOTE — Telephone Encounter (Signed)
Spoke with pt, she is now having productive cough. She has been able to clean the house.   Terrill Mohr 06/18/21 2:41 PM

## 2021-06-18 NOTE — Telephone Encounter (Signed)
Patient called stating that she feels like she needs a nebulizer, she was seen last week and the patient stated that she tested negative for all and is still coughing and feels a little short of breath and is using her inhailer but is wanting to know if you would send in a nebulizer for her?

## 2021-06-19 NOTE — Telephone Encounter (Signed)
Attempted to call pt. Xray resulted normal. Recommendations: Gay Filler sent in new zpack, finish prednisone and use albuterol inhaler as directed.   Royce Macadamia, [place] 06/19/21 8:19 AM

## 2021-06-19 NOTE — Telephone Encounter (Signed)
Patient returned call. She VU. She is questioning if she is ok to continue with jury duty on Monday. Date was pushed to then. If not she will need note.   Lorita Officer, CCMA 06/19/21 10:08 AM

## 2021-06-19 NOTE — Telephone Encounter (Signed)
Made pt aware.   [name] 06/19/21 12:23 PM

## 2021-06-24 ENCOUNTER — Encounter: Payer: Self-pay | Admitting: Family Medicine

## 2021-07-17 DIAGNOSIS — Z1159 Encounter for screening for other viral diseases: Secondary | ICD-10-CM | POA: Diagnosis not present

## 2021-07-17 DIAGNOSIS — Z202 Contact with and (suspected) exposure to infections with a predominantly sexual mode of transmission: Secondary | ICD-10-CM | POA: Diagnosis not present

## 2021-07-17 DIAGNOSIS — Z01419 Encounter for gynecological examination (general) (routine) without abnormal findings: Secondary | ICD-10-CM | POA: Diagnosis not present

## 2021-07-17 DIAGNOSIS — R69 Illness, unspecified: Secondary | ICD-10-CM | POA: Diagnosis not present

## 2021-07-17 LAB — OB RESULTS CONSOLE GC/CHLAMYDIA: Chlamydia: NEGATIVE

## 2021-07-17 LAB — HM HEPATITIS C SCREENING LAB: HM Hepatitis Screen: NEGATIVE

## 2021-07-17 LAB — HM HIV SCREENING LAB: HM HIV Screening: NEGATIVE

## 2021-07-31 DIAGNOSIS — I83893 Varicose veins of bilateral lower extremities with other complications: Secondary | ICD-10-CM | POA: Diagnosis not present

## 2021-09-09 ENCOUNTER — Telehealth (INDEPENDENT_AMBULATORY_CARE_PROVIDER_SITE_OTHER): Payer: Medicare HMO | Admitting: Internal Medicine

## 2021-09-09 ENCOUNTER — Encounter: Payer: Self-pay | Admitting: Internal Medicine

## 2021-09-09 VITALS — Wt 219.6 lb

## 2021-09-09 DIAGNOSIS — I10 Essential (primary) hypertension: Secondary | ICD-10-CM | POA: Diagnosis not present

## 2021-09-09 DIAGNOSIS — M791 Myalgia, unspecified site: Secondary | ICD-10-CM | POA: Diagnosis not present

## 2021-09-09 DIAGNOSIS — E785 Hyperlipidemia, unspecified: Secondary | ICD-10-CM

## 2021-09-09 DIAGNOSIS — I700 Atherosclerosis of aorta: Secondary | ICD-10-CM | POA: Diagnosis not present

## 2021-09-09 DIAGNOSIS — T466X5A Adverse effect of antihyperlipidemic and antiarteriosclerotic drugs, initial encounter: Secondary | ICD-10-CM

## 2021-09-09 MED ORDER — EZETIMIBE 10 MG PO TABS: 10.0000 mg | ORAL_TABLET | Freq: Every day | ORAL | 3 refills | Status: DC

## 2021-09-09 NOTE — Progress Notes (Signed)
? ?Virtual Visit via Video Note  ? ?This visit type was conducted due to national recommendations for restrictions regarding the COVID-19 Pandemic (e.g. social distancing) in an effort to limit this patient's exposure and mitigate transmission in our community.  Due to her co-morbid illnesses, this patient is at least at moderate risk for complications without adequate follow up.  This format is felt to be most appropriate for this patient at this time.  All issues noted in this document were discussed and addressed.  A limited physical exam was performed with this format.  Please refer to the patient's chart for her consent to telehealth for [facility]. ? ?   ? ?Date:  09/09/2021  ? ?ID:  [name], DOB Nov 24, 1965, MRN [id number] ?The patient was identified using 2 identifiers. ? ?Evaluation Performed:  New Patient Evaluation ? ?Patient Location:  ?[place] ?[place] [id number] ? ?Provider location:   ?[address], [address] ?[place], [place] [address] ? ?PCP:  Rochel Brome, MD  ?Cardiologist:  None ?Electrophysiologist:  None  ? ?Chief Complaint:  Manage dyslipidemia ? ?History of Present Illness:   ? ?[name] is a 56 y.o. female who presents via audio/video conferencing for a telehealth visit today.  This is a pleasant 56 year old female followed by Dr. Raliegh Ip with a history of Takatsubo cardiomyopathy and normalization of LVEF.  Recently she had coronary CT angiography in 2021 which showed no coronary disease and no coronary calcium.  She does have some aortic atherosclerosis.  There is a family history of heart disease in her father.  She has had elevated cholesterol in the past with total around 300, triglycerides 137, HDL 120 and LDL 157.  The HDL is remarkably high and 1 would wonder if this is cardioprotective for her or not.  The fact is that she has no coronary artery disease on her CT which is reassuring.  That being said in the past her LDL cholesterols been over 200, which could  argue that she has a genetic dyslipidemia or possible familial hyperlipidemia.  In this instance, we do see high HDL cholesterol which is not cardioprotective.  Ultimately we may need to do genetic testing for this.  Given the fact that there was no detectable coronary disease, however options are limited.  I do not think she will qualify for PCSK9 inhibitor unless we were certain she had a genetic dyslipidemia.  She unfortunately cannot take statins having had myalgias on a number of different statins.  Therefore we discussed several alternatives. ? ?The patient does not have symptoms concerning for COVID-19 infection (fever, chills, cough, or new SHORTNESS OF BREATH).  ? ? ?Prior CV studies:   ?The following studies were reviewed today: ? ?Labwork ? ?PMHx:  ?Past Medical History:  ?Diagnosis Date  ? Allergic reaction to bee sting 09/09/2018  ? Anxiety   ? Asthma   ? Bacterial vaginosis 07/07/2019  ? Bulging lumbar disc 07/22/2015  ? Cardiomyopathy ([place]) 07/18/2015  ? Overview:  Overview:  Taka Tsubo 2009, normal coronaries  Last Assessment & Plan:  Relevant Hx: Course: Daily Update: Today's Plan: she has recently been seen by cards and is planned to have echo this Friday  Electronically signed by: Baldemar Friday, [place] 07/24/15 1042  ? Carpal tunnel syndrome 07/22/2015  ? Cervical strain 01/11/2019  ? Chronic fatigue 07/22/2015  ? Chronic rhinitis 07/22/2015  ? Depression   ? Dyslipidemia 07/18/2015  ? Esophageal stricture 07/22/2015  ? Essential hypertension   ?  Eustachian tube dysfunction 07/22/2015  ? Excessive daytime sleepiness 05/16/2019  ? Gastro-esophageal reflux disease without esophagitis   ? Gastroesophageal reflux disease without esophagitis 07/18/2015  ? Last Assessment & Plan:  Relevant Hx: Course: Daily Update: Today's Plan:appears stable with ppi  Electronically signed by: Baldemar Friday, [place] 07/24/15 1043  ? High risk medication use 07/22/2015  ? History of blood clots   ? Hot flash, menopausal 07/22/2015   ? Hyperlipidemia 07/22/2015  ? Last Assessment & Plan:  Relevant Hx: Course: Daily Update: Today's Plan:stable with meds  Electronically signed by: Baldemar Friday, [place] 07/24/15 1112  ? Hypertension   ? Localized swelling of both lower legs 07/22/2015  ? Localized swelling of both lower legs 07/22/2015  ? Low back strain, initial encounter 07/07/2019  ? Lung mass   ? Mixed hyperlipidemia 07/22/2015  ? Last Assessment & Plan:  Formatting of this note might be different from the original. Relevant Hx: Course: Daily Update: Today's Plan:stable with meds  Electronically signed by: Baldemar Friday, [place] 07/24/15 1112  ? Morbid obesity ([place]) 07/22/2015  ? Other insomnia 11/22/2018  ? Pneumonia due to COVID-19 virus 01/17/2020  ? Pulmonary embolus ([place]) 01/18/2020  ? Screening for osteoporosis 02/27/2017  ? Formatting of this note might be different from the original. 2019: spine 3.1, L femur 1.3  ? Seasonal allergic rhinitis due to pollen 07/22/2015  ? Snores 07/22/2015  ? Last Assessment & Plan:  Relevant Hx: Course: Daily Update: Today's Plan:she was to have psg, but has not had this yet. Discussed that if this is present could be worse post-op with pain meds. None the less, she will have protective airway with/during surgery. She should still have psg study, but wants to wait until after knee repair  Electronically signed by: Baldemar Friday, FNP 07/24/15  ? ? ?Past Surgical History:  ?Procedure Laterality Date  ? ABDOMINAL HYSTERECTOMY    ? CARPAL TUNNEL RELEASE Right   ? CHOLECYSTECTOMY    ? ECTOPIC PREGNANCY SURGERY    ? X 2  ? FOOT SURGERY    ? Knee replcement    ? POLYPECTOMY    ? TONSILECTOMY/ADENOIDECTOMY WITH MYRINGOTOMY  1972  ? TOTAL HIP ARTHROPLASTY Right   ? WISDOM TOOTH EXTRACTION    ? ? ?FAMHx:  ?Family History  ?Problem Relation Age of Onset  ? Heart attack Father   ? Heart disease Father   ? Dementia Mother   ? Diabetes Half-Sister   ? Leukemia Half-Brother   ? Multiple myeloma Half-Brother    ? ? ?SOCHx:  ? reports that she quit smoking about 15 years ago. Her smoking use included cigarettes. She has never used smokeless tobacco. She reports current alcohol use of about 3.0 - 4.0 standard drinks per week. She reports that she does not use drugs. ? ?ALLERGIES:  ?Allergies  ?Allergen Reactions  ? Demerol Anaphylaxis  ? Meperidine Anaphylaxis  ? Sulfamethoxazole Itching  ?  With redness to skin   ? Citalopram Hydrobromide Other (See Comments)  ?  Unknown  ? Hydrocodone   ? Hydrocodone-Acetaminophen Other (See Comments)  ?  Unknown  ? Metoprolol Tartrate Other (See Comments)  ?  Chest discomfort, palpations  ? Oxycodone-Acetaminophen Other (See Comments)  ?  Unknown  ? Rosuvastatin Other (See Comments)  ?  myalgias  ? Diltiazem Rash  ? Metoprolol Tartrate Palpitations  ? ? ?MEDS: ? ?Current Meds  ?Medication Sig  ? albuterol (VENTOLIN HFA) 108 (90 Base) MCG/ACT inhaler Inhale 1-2  puffs into the lungs every 6 (six) hours as needed for wheezing or shortness of breath.  ? amLODipine (NORVASC) 10 MG tablet Take 5-10 mg by mouth daily.  ? aspirin EC 81 MG tablet Take 81 mg by mouth daily. Swallow whole.  ? esomeprazole (NEXIUM) 20 MG capsule Take 20 mg by mouth daily at 12 noon.  ? ezetimibe (ZETIA) 10 MG tablet Take 1 tablet (10 mg total) by mouth daily.  ? fluticasone (FLONASE) 50 MCG/ACT nasal spray Place 1 spray into both nostrils daily.  ? furosemide (LASIX) 20 MG tablet Take 1 tablet (20 mg total) by mouth as needed for fluid or edema.  ? KRILL OIL PO Take 400 mg by mouth daily.  ? LORazepam (ATIVAN) 0.5 MG tablet Take 1 tablet (0.5 mg total) by mouth daily as needed for anxiety (panic attacks).  ? Multiple Vitamin (MULTIVITAMIN) tablet Take 1 tablet by mouth daily.  ? nitroGLYCERIN (NITROSTAT) 0.4 MG SL tablet Place 1 tablet (0.4 mg total) under the tongue every 5 (five) minutes as needed for chest pain (x3 total. call ems if chest pain does not resolve after one, call ems.).  ? ondansetron (ZOFRAN) 4 MG  tablet Take 1 tablet (4 mg total) by mouth every 8 (eight) hours as needed for nausea or vomiting.  ? promethazine-dextromethorphan (PROMETHAZINE-DM) 6.25-15 MG/5ML syrup Take 5 mLs by mouth 4 (four) times daily

## 2021-09-09 NOTE — Patient Instructions (Signed)
Medication Instructions:  ?START zetia 10mg  daily ? ?*If you need a refill on your cardiac medications before your next appointment, please call your pharmacy* ? ? ?Lab Work: ?FASTING lipid panel in 3-4 months to assess cholesterol  ?-- complete before next visit with Dr.  ? ?If you have labs (blood work) drawn today and your tests are completely normal, you will receive your results only by: ?MyChart Message (if you have MyChart) OR ?A paper copy in the mail ?If you have any lab test that is abnormal or we need to change your treatment, we will call you to review the results. ? ? ?Testing/Procedures: ?NONE ? ? ?Follow-Up: ?At [facility], you and your health needs are our priority.  As part of our continuing mission to provide you with exceptional heart care, we have created designated Provider Care Teams.  These Care Teams include your primary Cardiologist (physician) and Advanced Practice Providers (APPs -  Physician Assistants and Nurse Practitioners) who all work together to provide you with the care you need, when you need it. ? ?We recommend signing up for the patient portal called "MyChart".  Sign up information is provided on this After Visit Summary.  MyChart is used to connect with patients for Virtual Visits (Telemedicine).  Patients are able to view lab/test results, encounter notes, upcoming appointments, etc.  Non-urgent messages can be sent to your provider as well.   ?To learn more about what you can do with MyChart, go to [facility].   ? ?Your next appointment:   ?3-4 month(s) ? ?The format for your next appointment:   ?In Person or Virtual ? ?Provider:   ?[email or web address], MD ? ?

## 2021-09-13 ENCOUNTER — Other Ambulatory Visit: Payer: Self-pay | Admitting: Family Medicine

## 2021-09-13 DIAGNOSIS — F331 Major depressive disorder, recurrent, moderate: Secondary | ICD-10-CM

## 2021-09-13 DIAGNOSIS — F411 Generalized anxiety disorder: Secondary | ICD-10-CM

## 2021-10-07 ENCOUNTER — Encounter: Payer: Self-pay | Admitting: Family Medicine

## 2021-10-16 ENCOUNTER — Ambulatory Visit (INDEPENDENT_AMBULATORY_CARE_PROVIDER_SITE_OTHER): Payer: Medicare HMO | Admitting: Cardiology

## 2021-10-16 ENCOUNTER — Other Ambulatory Visit: Payer: Self-pay | Admitting: Nurse Practitioner

## 2021-10-16 ENCOUNTER — Encounter: Payer: Self-pay | Admitting: Cardiology

## 2021-10-16 ENCOUNTER — Other Ambulatory Visit: Payer: Self-pay

## 2021-10-16 ENCOUNTER — Ambulatory Visit (INDEPENDENT_AMBULATORY_CARE_PROVIDER_SITE_OTHER): Payer: Medicare HMO | Admitting: Nurse Practitioner

## 2021-10-16 ENCOUNTER — Encounter: Payer: Self-pay | Admitting: Nurse Practitioner

## 2021-10-16 VITALS — BP 136/86 | HR 90 | Temp 97.3°F | Ht 63.0 in | Wt 221.0 lb

## 2021-10-16 VITALS — BP 158/100 | HR 91 | Ht 63.0 in | Wt 219.0 lb

## 2021-10-16 DIAGNOSIS — F411 Generalized anxiety disorder: Secondary | ICD-10-CM

## 2021-10-16 DIAGNOSIS — K219 Gastro-esophageal reflux disease without esophagitis: Secondary | ICD-10-CM

## 2021-10-16 DIAGNOSIS — E785 Hyperlipidemia, unspecified: Secondary | ICD-10-CM | POA: Diagnosis not present

## 2021-10-16 DIAGNOSIS — F330 Major depressive disorder, recurrent, mild: Secondary | ICD-10-CM

## 2021-10-16 DIAGNOSIS — I10 Essential (primary) hypertension: Secondary | ICD-10-CM

## 2021-10-16 DIAGNOSIS — Z1231 Encounter for screening mammogram for malignant neoplasm of breast: Secondary | ICD-10-CM | POA: Diagnosis not present

## 2021-10-16 DIAGNOSIS — I420 Dilated cardiomyopathy: Secondary | ICD-10-CM

## 2021-10-16 DIAGNOSIS — Z6839 Body mass index (BMI) 39.0-39.9, adult: Secondary | ICD-10-CM | POA: Diagnosis not present

## 2021-10-16 DIAGNOSIS — E782 Mixed hyperlipidemia: Secondary | ICD-10-CM | POA: Diagnosis not present

## 2021-10-16 DIAGNOSIS — R69 Illness, unspecified: Secondary | ICD-10-CM | POA: Diagnosis not present

## 2021-10-16 DIAGNOSIS — F331 Major depressive disorder, recurrent, moderate: Secondary | ICD-10-CM

## 2021-10-16 MED ORDER — NITROGLYCERIN 0.4 MG SL SUBL: 0.4000 mg | SUBLINGUAL_TABLET | SUBLINGUAL | 0 refills | Status: DC | PRN

## 2021-10-16 MED ORDER — LORAZEPAM 0.5 MG PO TABS: 0.5000 mg | ORAL_TABLET | Freq: Every day | ORAL | 1 refills | Status: DC | PRN

## 2021-10-16 MED ORDER — FUROSEMIDE 20 MG PO TABS: 20.0000 mg | ORAL_TABLET | ORAL | 3 refills | Status: DC | PRN

## 2021-10-16 NOTE — Patient Instructions (Signed)

## 2021-10-16 NOTE — Patient Instructions (Signed)
Continue medications ?We will call you lab results and mammogram for July 27th, 2023 ?Follow-up in 46-months ? ? ?Managing Anxiety, Adult ?After being diagnosed with anxiety, you may be relieved to know why you have felt or behaved a certain way. You may also feel overwhelmed about the treatment ahead and what it will mean for your life. With care and support, you can manage this condition. ?How to manage lifestyle changes ?Managing stress and anxiety ? ?Stress is your body's reaction to life changes and events, both good and bad. Most stress will last just a few hours, but stress can be ongoing and can lead to more than just stress. Although stress can play a major role in anxiety, it is not the same as anxiety. Stress is usually caused by something external, such as a deadline, test, or competition. Stress normally passes after the triggering event has ended.  ?Anxiety is caused by something internal, such as imagining a terrible outcome or worrying that something will go wrong that will devastate you. Anxiety often does not go away even after the triggering event is over, and it can become long-term (chronic) worry. It is important to understand the differences between stress and anxiety and to manage your stress effectively so that it does not lead to an anxious response. ?Talk with your health care provider or a counselor to learn more about reducing anxiety and stress. He or she may suggest tension reduction techniques, such as: ?Music therapy. Spend time creating or listening to music that you enjoy and that inspires you. ?Mindfulness-based meditation. Practice being aware of your normal breaths while not trying to control your breathing. It can be done while sitting or walking. ?Centering prayer. This involves focusing on a word, phrase, or sacred image that means something to you and brings you peace. ?Deep breathing. To do this, expand your stomach and inhale slowly through your nose. Hold your breath for  3-5 seconds. Then exhale slowly, letting your stomach muscles relax. ?Self-talk. Learn to notice and identify thought patterns that lead to anxiety reactions and change those patterns to thoughts that feel peaceful. ?Muscle relaxation. Taking time to tense muscles and then relax them. ?Choose a tension reduction technique that fits your lifestyle and personality. These techniques take time and practice. Set aside 5-15 minutes a day to do them. Therapists can offer counseling and training in these techniques. The training to help with anxiety may be covered by some insurance plans. ?Other things you can do to manage stress and anxiety include: ?Keeping a stress diary. This can help you learn what triggers your reaction and then learn ways to manage your response. ?Thinking about how you react to certain situations. You may not be able to control everything, but you can control your response. ?Making time for activities that help you relax and not feeling guilty about spending your time in this way. ?Doing visual imagery. This involves imagining or creating mental pictures to help you relax. ?Practicing yoga. Through yoga poses, you can lower tension and promote relaxation. ? ?Medicines ?Medicines can help ease symptoms. Medicines for anxiety include: ?Antidepressant medicines. These are usually prescribed for long-term daily control. ?Anti-anxiety medicines. These may be added in severe cases, especially when panic attacks occur. ?Medicines will be prescribed by a health care provider. When used together, medicines, psychotherapy, and tension reduction techniques may be the most effective treatment. ?Relationships ?Relationships can play a big part in helping you recover. Try to spend more time connecting with trusted friends and  family members. ?Consider going to couples counseling if you have a partner, taking family education classes, or going to family therapy. ?Therapy can help you and others better understand  your condition. ?How to recognize changes in your anxiety ?Everyone responds differently to treatment for anxiety. Recovery from anxiety happens when symptoms decrease and stop interfering with your daily activities at home or work. This may mean that you will start to: ?Have better concentration and focus. Worry will interfere less in your daily thinking. ?Sleep better. ?Be less irritable. ?Have more energy. ?Have improved memory. ?It is also important to recognize when your condition is getting worse. Contact your health care provider if your symptoms interfere with home or work and you feel like your condition is not improving. ?Follow these instructions at home: ?Activity ?Exercise. Adults should do the following: ?Exercise for at least 150 minutes each week. The exercise should increase your heart rate and make you sweat (moderate-intensity exercise). ?Strengthening exercises at least twice a week. ?Get the right amount and quality of sleep. Most adults need 7-9 hours of sleep each night. ?Lifestyle ? ?Eat a healthy diet that includes plenty of vegetables, fruits, whole grains, low-fat dairy products, and lean protein. ?Do not eat a lot of foods that are high in fats, added sugars, or salt (sodium). ?Make choices that simplify your life. ?Do not use any products that contain nicotine or tobacco. These products include cigarettes, chewing tobacco, and vaping devices, such as e-cigarettes. If you need help quitting, ask your health care provider. ?Avoid caffeine, alcohol, and certain over-the-counter cold medicines. These may make you feel worse. Ask your pharmacist which medicines to avoid. ?General instructions ?Take over-the-counter and prescription medicines only as told by your health care provider. ?Keep all follow-up visits. This is important. ?Where to find support ?You can get help and support from these sources: ?Self-help groups. ?Online and [employer]. ?A trusted spiritual  leader. ?Couples counseling. ?Family education classes. ?Family therapy. ?Where to find more information ?You may find that joining a support group helps you deal with your anxiety. The following sources can help you locate counselors or support groups near you: ?Mental Health America: www.mentalhealthamerica.net ?Anxiety and Depression Association of America (ADAA): ProgramCam.de ?National Alliance on Mental Illness (NAMI): www.nami.org ?Contact a health care provider if: ?You have a hard time staying focused or finishing daily tasks. ?You spend many hours a day feeling worried about everyday life. ?You become exhausted by worry. ?You start to have headaches or frequently feel tense. ?You develop chronic nausea or diarrhea. ?Get help right away if: ?You have a racing heart and shortness of breath. ?You have thoughts of hurting yourself or others. ?If you ever feel like you may hurt yourself or others, or have thoughts about taking your own life, get help right away. Go to your nearest emergency department or: ?Call your local emergency services (911 in the [place] ?Call a suicide crisis helpline, such as the National Suicide Prevention Lifeline at 762-226-8798 or 988 in the [place] This is open 24 hours a day in the [place] ?Text the Crisis Text Line at 862-662-0966 (in the [place] ?Summary ?Taking steps to learn and use tension reduction techniques can help calm you and help prevent triggering an anxiety reaction. ?When used together, medicines, psychotherapy, and tension reduction techniques may be the most effective treatment. ?Family, friends, and partners can play a big part in supporting you. ?This information is not intended to replace advice given to you by your health care provider. Make  sure you discuss any questions you have with your health care provider. ?Document Revised: 12/26/2020 Document Reviewed: 09/23/2020 ?Elsevier Patient Education ? 2023 [employer] ? ?

## 2021-10-16 NOTE — Progress Notes (Signed)
?Cardiology Office Note:   ? ?Date:  10/16/2021  ? ?ID:  [name], DOB Oct 21, 1965, MRN [id number] ? ?PCP:  Rochel Brome, MD  ?Cardiologist:  Jenne Campus, MD   ? ?Referring MD: Rochel Brome, MD  ? ?Chief Complaint  ?Patient presents with  ? Medication Refill  ?  Lasix and Nitroglycerin  ? ? ?History of Present Illness:   ? ?[name] is a 56 y.o. female with past medical history significant for nonischemic cardiomyopathy with normalization of left ventricle ejection fraction, coronary CT angio showed calcium score 0 normal coronary arteries.  Also history of essential hypertension, dyslipidemia with intolerance to multiple statins.  She is in my office today for follow-up overall doing very well.  She is excited about remodeling her apartment she is trying to sell it she brought some camper at the beach and she is planning to move in there at least for the summer.  Denies have any chest pain tightness squeezing pressure burning, no palpitations no dizziness no swelling of lower extremity ? ?Past Medical History:  ?Diagnosis Date  ? Allergic reaction to bee sting 09/09/2018  ? Anxiety   ? Asthma   ? Bacterial vaginosis 07/07/2019  ? Bulging lumbar disc 07/22/2015  ? Cardiomyopathy ([place]) 07/18/2015  ? Overview:  Overview:  Taka Tsubo 2009, normal coronaries  Last Assessment & Plan:  Relevant Hx: Course: Daily Update: Today's Plan: she has recently been seen by cards and is planned to have echo this Friday  Electronically signed by: Baldemar Friday, [place] 07/24/15 1042  ? Carpal tunnel syndrome 07/22/2015  ? Cervical strain 01/11/2019  ? Chronic fatigue 07/22/2015  ? Chronic rhinitis 07/22/2015  ? Depression   ? Dyslipidemia 07/18/2015  ? Esophageal stricture 07/22/2015  ? Essential hypertension   ? Eustachian tube dysfunction 07/22/2015  ? Excessive daytime sleepiness 05/16/2019  ? Gastro-esophageal reflux disease without esophagitis   ? Gastroesophageal reflux disease without esophagitis 07/18/2015  ? Last Assessment &  Plan:  Relevant Hx: Course: Daily Update: Today's Plan:appears stable with ppi  Electronically signed by: Baldemar Friday, [place] 07/24/15 1043  ? High risk medication use 07/22/2015  ? History of blood clots   ? Hot flash, menopausal 07/22/2015  ? Hyperlipidemia 07/22/2015  ? Last Assessment & Plan:  Relevant Hx: Course: Daily Update: Today's Plan:stable with meds  Electronically signed by: Baldemar Friday, [place] 07/24/15 1112  ? Hypertension   ? Localized swelling of both lower legs 07/22/2015  ? Localized swelling of both lower legs 07/22/2015  ? Low back strain, initial encounter 07/07/2019  ? Lung mass   ? Mixed hyperlipidemia 07/22/2015  ? Last Assessment & Plan:  Formatting of this note might be different from the original. Relevant Hx: Course: Daily Update: Today's Plan:stable with meds  Electronically signed by: Baldemar Friday, [place] 07/24/15 1112  ? Morbid obesity ([place]) 07/22/2015  ? Other insomnia 11/22/2018  ? Pneumonia due to COVID-19 virus 01/17/2020  ? Pulmonary embolus ([place]) 01/18/2020  ? Screening for osteoporosis 02/27/2017  ? Formatting of this note might be different from the original. 2019: spine 3.1, L femur 1.3  ? Seasonal allergic rhinitis due to pollen 07/22/2015  ? Snores 07/22/2015  ? Last Assessment & Plan:  Relevant Hx: Course: Daily Update: Today's Plan:she was to have psg, but has not had this yet. Discussed that if this is present could be worse post-op with pain meds. None the less, she will have protective airway with/during surgery. She should still  have psg study, but wants to wait until after knee repair  Electronically signed by: Baldemar Friday, FNP 07/24/15  ? ? ?Past Surgical History:  ?Procedure Laterality Date  ? ABDOMINAL HYSTERECTOMY    ? CARPAL TUNNEL RELEASE Right   ? CHOLECYSTECTOMY    ? ECTOPIC PREGNANCY SURGERY    ? X 2  ? FOOT SURGERY    ? Knee replcement    ? POLYPECTOMY    ? TONSILECTOMY/ADENOIDECTOMY WITH MYRINGOTOMY  1972  ? TOTAL HIP ARTHROPLASTY Right   ?  WISDOM TOOTH EXTRACTION    ? ? ?Current Medications: ?Current Meds  ?Medication Sig  ? albuterol (VENTOLIN HFA) 108 (90 Base) MCG/ACT inhaler Inhale 1-2 puffs into the lungs every 6 (six) hours as needed for wheezing or shortness of breath.  ? amLODipine (NORVASC) 10 MG tablet TAKE 1 TABLET BY MOUTH ONCE DAILY. (Patient taking differently: Take 10 mg by mouth daily.)  ? aspirin EC 81 MG tablet Take 81 mg by mouth daily. Swallow whole.  ? esomeprazole (NEXIUM) 20 MG capsule Take 20 mg by mouth daily at 12 noon.  ? ezetimibe (ZETIA) 10 MG tablet Take 1 tablet (10 mg total) by mouth daily.  ? fluticasone (FLONASE) 50 MCG/ACT nasal spray Place 1 spray into both nostrils daily.  ? furosemide (LASIX) 20 MG tablet Take 1 tablet (20 mg total) by mouth as needed for fluid or edema.  ? KRILL OIL PO Take 400 mg by mouth daily.  ? LORazepam (ATIVAN) 0.5 MG tablet Take 1 tablet (0.5 mg total) by mouth daily as needed for anxiety (panic attacks).  ? Multiple Vitamin (MULTIVITAMIN) tablet Take 1 tablet by mouth daily.  ? nitroGLYCERIN (NITROSTAT) 0.4 MG SL tablet Place 1 tablet (0.4 mg total) under the tongue every 5 (five) minutes as needed for chest pain (x3 total. call ems if chest pain does not resolve after one, call ems.).  ? ondansetron (ZOFRAN) 4 MG tablet Take 1 tablet (4 mg total) by mouth every 8 (eight) hours as needed for nausea or vomiting.  ? sertraline (ZOLOFT) 100 MG tablet TAKE 1- 1/2 TABLET BY MOUTH ONCE DAILY. (Patient taking differently: Take 100 mg by mouth daily.)  ? [DISCONTINUED] promethazine-dextromethorphan (PROMETHAZINE-DM) 6.25-15 MG/5ML syrup Take 5 mLs by mouth 4 (four) times daily as needed for cough.  ?  ? ?Allergies:   Demerol, Meperidine, Sulfamethoxazole, Citalopram hydrobromide, Hydrocodone, Hydrocodone-acetaminophen, Metoprolol tartrate, Oxycodone-acetaminophen, Rosuvastatin, Diltiazem, and Metoprolol tartrate  ? ?Social History  ? ?Socioeconomic History  ? Marital status: Divorced  ?  Spouse  name: NONE  ? Number of children: 0  ? Years of education: 66 +  [place]  ? Highest education level: Not on file  ?Occupational History  ? Occupation: DIABLED  ?Tobacco Use  ? Smoking status: Former  ?  Types: Cigarettes  ?  Quit date: 06/16/2006  ?  Years since quitting: 15.3  ? Smokeless tobacco: Never  ?Vaping Use  ? Vaping Use: Never used  ?Substance and Sexual Activity  ? Alcohol use: Yes  ?  Alcohol/week: 3.0 - 4.0 standard drinks  ?  Types: 3 - 4 Cans of beer per week  ? Drug use: No  ? Sexual activity: Yes  ?Other Topics Concern  ? Not on file  ?Social History Narrative  ? Not on file  ? ?Social Determinants of Health  ? ?Financial Resource Strain: Low Risk   ? Difficulty of Paying Living Expenses: Not hard at all  ?Food Insecurity: No Food Insecurity  ?  Worried About Charity fundraiser in the Last Year: Never true  ? Ran Out of Food in the Last Year: Never true  ?Transportation Needs: No Transportation Needs  ? Lack of Transportation (Medical): No  ? Lack of Transportation (Non-Medical): No  ?Physical Activity: Inactive  ? Days of Exercise per Week: 0 days  ? Minutes of Exercise per Session: 0 min  ?Stress: No Stress Concern Present  ? Feeling of Stress : Not at all  ?Social Connections: Socially Isolated  ? Frequency of Communication with Friends and Family: More than three times a week  ? Frequency of Social Gatherings with Friends and Family: Twice a week  ? Attends Religious Services: Never  ? Active Member of Clubs or Organizations: No  ? Attends Archivist Meetings: Never  ? Marital Status: Divorced  ?  ? ?Family History: ?The patient's family history includes Dementia in her mother; Diabetes in her half-sister; Heart attack in her father; Heart disease in her father; Leukemia in her half-brother; Multiple myeloma in her half-brother. ?ROS:   ?Please see the history of present illness.    ?All 14 point review of systems negative except as described per history of present  illness ? ?EKGs/Labs/Other Studies Reviewed:   ? ? ? ?Recent Labs: ?11/23/2020: TSH 1.550 ?01/07/2021: ALT 21; BUN 14; Creatinine, Ser 0.66; Hemoglobin 14.1; Platelets 321; Potassium 5.1; Sodium 141  ?Recent Lipid Panel ?

## 2021-10-16 NOTE — Progress Notes (Signed)
? ?Subjective:  ?Patient ID: [name], female    DOB: 31-Oct-1965  Age: 56 y.o. MRN: [id number] ? ?Chief Complaint  ?Patient presents with  ? Hyperlipidemia  ? Hypertension  ? Anxiety  ? Depression  ? Gastroesophageal Reflux  ? ? ?HPI ? [name] is a 56 year old Caucasian female that presents for follow-up hyperlipidemia, hypertension, GERD, and anxiety with depression. Reports recent increase in life stress related to live-in boyfriends adult son's substance abuse problem.  ?Lipid/Cholesterol, Follow-up ? ?Last lipid panel Other pertinent labs  ?Lab Results  ?Component Value Date  ? CHOL 300 (H) 04/18/2021  ? HDL 120 04/18/2021  ? LDLCALC 157 (H) 04/18/2021  ? TRIG 137 04/18/2021  ? CHOLHDL 2.5 04/18/2021  ? Lab Results  ?Component Value Date  ? ALT 21 01/07/2021  ? AST 23 01/07/2021  ? PLT 321 01/07/2021  ? TSH 1.550 11/23/2020  ?  ? ?She was last seen for this 9 months ago.  ?Management since that visit includes Zetia. ?    She reports excellent compliance with treatment. ?She is not having side effects.  ? ? ?Hypertension, follow-up: ? ?BP at that visit was 140/86. Management since that visit includes amlodipine. ?    She reports excellent compliance with treatment. ?She is not having side effects.  ?She is following a Regular diet. ?She is not exercising. ?She does not smoke. ? ?Pertinent labs: ?Lab Results  ?Component Value Date  ? CHOL 300 (H) 04/18/2021  ? HDL 120 04/18/2021  ? LDLCALC 157 (H) 04/18/2021  ? TRIG 137 04/18/2021  ? CHOLHDL 2.5 04/18/2021  ? Lab Results  ?Component Value Date  ? NA 141 01/07/2021  ? K 5.1 01/07/2021  ? CREATININE 0.66 01/07/2021  ? EGFR 104 01/07/2021  ? GFRNONAA 106 05/04/2020  ? GLUCOSE 95 01/07/2021  ?  ? ? ?Anxiety, Follow-up ? ?She was last seen for anxiety 9 months ago. ?Changes made at last visit include none.  ?She reports excellent compliance with treatment. ?She reports excellent tolerance of treatment. ?She is not having side effects.  ?        She feels her anxiety  is moderate and Worse since last visit. ? ? ?GAD-7 Results ? ?  10/16/2021  ?  2:49 PM  ?GAD-7 Generalized Anxiety Disorder Screening Tool  ?1. Feeling Nervous, Anxious, or on Edge 3  ?2. Not Being Able to Stop or Control Worrying 1  ?3. Worrying Too Much About Different Things 2  ?4. Trouble Relaxing 1  ?5. Being So Restless it's Hard To Sit Still 1  ?6. Becoming Easily Annoyed or Irritable 2  ?7. Feeling Afraid As If Something Awful Might Happen 0  ?Total GAD-7 Score 10  ?Difficulty At Work, Home, or Getting  Along With Others? Somewhat difficult  ? ? ? ?Depression, Follow-up ? ?She  was last seen for this 9 months ago. ?Changes made at last visit include sertraline. ?She reports excellent compliance with treatment. ?She is not having side effects.  ?        She reports excellent tolerance of treatment. ?Current symptoms include: difficulty concentrating and fatigue ?She feels she is Worse since last visit. ? ? ?  10/16/2021  ?  2:47 PM 02/16/2021  ?  8:57 AM 08/22/2020  ?  3:06 PM  ?Depression screen PHQ 2/9  ?Decreased Interest 1 0 1  ?Down, Depressed, Hopeless 2 0 2  ?PHQ - 2 Score 3 0 3  ?Altered sleeping 3  2  ?Tired, decreased  energy 3  2  ?Change in appetite 2  0  ?Feeling bad or failure about yourself  0  1  ?Trouble concentrating 3  2  ?Moving slowly or fidgety/restless 1  2  ?Suicidal thoughts 0  0  ?PHQ-9 Score 15  12  ?Difficult doing work/chores Somewhat difficult  Very difficult  ?  ? ?GERD, Follow up: ? ?The patient was last seen for GERD 9 months ago. ?Currently taking Nexium. ?    She reports excellent compliance with treatment. ?She is not having side effects. . ?    She IS experiencing belching. ?She is NOT experiencing chest pain, cough, or difficulty swallowing ? ? ?Current Outpatient Medications on File Prior to Visit  ?Medication Sig Dispense Refill  ? albuterol (VENTOLIN HFA) 108 (90 Base) MCG/ACT inhaler Inhale 1-2 puffs into the lungs every 6 (six) hours as needed for wheezing or shortness of  breath.    ? amLODipine (NORVASC) 10 MG tablet TAKE 1 TABLET BY MOUTH ONCE DAILY. (Patient taking differently: Take 10 mg by mouth daily.) 90 tablet 0  ? aspirin EC 81 MG tablet Take 81 mg by mouth daily. Swallow whole.    ? esomeprazole (NEXIUM) 20 MG capsule Take 20 mg by mouth daily at 12 noon.    ? ezetimibe (ZETIA) 10 MG tablet Take 1 tablet (10 mg total) by mouth daily. 90 tablet 3  ? fluticasone (FLONASE) 50 MCG/ACT nasal spray Place 1 spray into both nostrils daily.    ? KRILL OIL PO Take 400 mg by mouth daily.    ? LORazepam (ATIVAN) 0.5 MG tablet Take 1 tablet (0.5 mg total) by mouth daily as needed for anxiety (panic attacks). 30 tablet 1  ? Multiple Vitamin (MULTIVITAMIN) tablet Take 1 tablet by mouth daily.    ? ondansetron (ZOFRAN) 4 MG tablet Take 1 tablet (4 mg total) by mouth every 8 (eight) hours as needed for nausea or vomiting. 20 tablet 0  ? sertraline (ZOLOFT) 100 MG tablet TAKE 1- 1/2 TABLET BY MOUTH ONCE DAILY. (Patient taking differently: Take 100 mg by mouth daily.) 105 tablet 1  ? ?No current facility-administered medications on file prior to visit.  ? ?Past Medical History:  ?Diagnosis Date  ? Allergic reaction to bee sting 09/09/2018  ? Anxiety   ? Asthma   ? Bacterial vaginosis 07/07/2019  ? Bulging lumbar disc 07/22/2015  ? Cardiomyopathy ([place]) 07/18/2015  ? Overview:  Overview:  Taka Tsubo 2009, normal coronaries  Last Assessment & Plan:  Relevant Hx: Course: Daily Update: Today's Plan: she has recently been seen by cards and is planned to have echo this Friday  Electronically signed by: Baldemar Friday, [place] 07/24/15 1042  ? Carpal tunnel syndrome 07/22/2015  ? Cervical strain 01/11/2019  ? Chronic fatigue 07/22/2015  ? Chronic rhinitis 07/22/2015  ? Depression   ? Dyslipidemia 07/18/2015  ? Esophageal stricture 07/22/2015  ? Essential hypertension   ? Eustachian tube dysfunction 07/22/2015  ? Excessive daytime sleepiness 05/16/2019  ? Gastro-esophageal reflux disease without esophagitis   ?  Gastroesophageal reflux disease without esophagitis 07/18/2015  ? Last Assessment & Plan:  Relevant Hx: Course: Daily Update: Today's Plan:appears stable with ppi  Electronically signed by: Baldemar Friday, [place] 07/24/15 1043  ? High risk medication use 07/22/2015  ? History of blood clots   ? Hot flash, menopausal 07/22/2015  ? Hyperlipidemia 07/22/2015  ? Last Assessment & Plan:  Relevant Hx: Course: Daily Update: Today's Plan:stable with meds  Electronically  signed by: Baldemar Friday, [place] 07/24/15 1112  ? Hypertension   ? Localized swelling of both lower legs 07/22/2015  ? Localized swelling of both lower legs 07/22/2015  ? Low back strain, initial encounter 07/07/2019  ? Lung mass   ? Mixed hyperlipidemia 07/22/2015  ? Last Assessment & Plan:  Formatting of this note might be different from the original. Relevant Hx: Course: Daily Update: Today's Plan:stable with meds  Electronically signed by: Baldemar Friday, [place] 07/24/15 1112  ? Morbid obesity ([place]) 07/22/2015  ? Other insomnia 11/22/2018  ? Pneumonia due to COVID-19 virus 01/17/2020  ? Pulmonary embolus ([place]) 01/18/2020  ? Screening for osteoporosis 02/27/2017  ? Formatting of this note might be different from the original. 2019: spine 3.1, L femur 1.3  ? Seasonal allergic rhinitis due to pollen 07/22/2015  ? Snores 07/22/2015  ? Last Assessment & Plan:  Relevant Hx: Course: Daily Update: Today's Plan:she was to have psg, but has not had this yet. Discussed that if this is present could be worse post-op with pain meds. None the less, she will have protective airway with/during surgery. She should still have psg study, but wants to wait until after knee repair  Electronically signed by: Baldemar Friday, FNP 07/24/15  ? ?Past Surgical History:  ?Procedure Laterality Date  ? ABDOMINAL HYSTERECTOMY    ? CARPAL TUNNEL RELEASE Right   ? CHOLECYSTECTOMY    ? ECTOPIC PREGNANCY SURGERY    ? X 2  ? FOOT SURGERY    ? Knee replcement    ? POLYPECTOMY    ?  TONSILECTOMY/ADENOIDECTOMY WITH MYRINGOTOMY  1972  ? TOTAL HIP ARTHROPLASTY Right   ? WISDOM TOOTH EXTRACTION    ?  ?Family History  ?Problem Relation Age of Onset  ? Heart attack Father   ? Heart disease Father   ?

## 2021-10-17 LAB — CBC WITH DIFFERENTIAL/PLATELET
Basophils Absolute: 0 10*3/uL (ref 0.0–0.2)
Basos: 1 %
EOS (ABSOLUTE): 0.1 10*3/uL (ref 0.0–0.4)
Eos: 1 %
Hematocrit: 39.1 % (ref 34.0–46.6)
Hemoglobin: 13.9 g/dL (ref 11.1–15.9)
Immature Grans (Abs): 0 10*3/uL (ref 0.0–0.1)
Immature Granulocytes: 0 %
Lymphocytes Absolute: 1.5 10*3/uL (ref 0.7–3.1)
Lymphs: 25 %
MCH: 33.3 pg — ABNORMAL HIGH (ref 26.6–33.0)
MCHC: 35.5 g/dL (ref 31.5–35.7)
MCV: 94 fL (ref 79–97)
Monocytes Absolute: 0.5 10*3/uL (ref 0.1–0.9)
Monocytes: 9 %
Neutrophils Absolute: 3.7 10*3/uL (ref 1.4–7.0)
Neutrophils: 64 %
Platelets: 309 10*3/uL (ref 150–450)
RBC: 4.17 x10E6/uL (ref 3.77–5.28)
RDW: 11.6 % — ABNORMAL LOW (ref 11.7–15.4)
WBC: 5.8 10*3/uL (ref 3.4–10.8)

## 2021-10-17 LAB — COMPREHENSIVE METABOLIC PANEL
ALT: 16 IU/L (ref 0–32)
AST: 21 IU/L (ref 0–40)
Albumin/Globulin Ratio: 1.9 (ref 1.2–2.2)
Albumin: 4.5 g/dL (ref 3.8–4.9)
Alkaline Phosphatase: 113 IU/L (ref 44–121)
BUN/Creatinine Ratio: 18 (ref 9–23)
BUN: 11 mg/dL (ref 6–24)
Bilirubin Total: 0.3 mg/dL (ref 0.0–1.2)
CO2: 25 mmol/L (ref 20–29)
Calcium: 9.3 mg/dL (ref 8.7–10.2)
Chloride: 102 mmol/L (ref 96–106)
Creatinine, Ser: 0.62 mg/dL (ref 0.57–1.00)
Globulin, Total: 2.4 g/dL (ref 1.5–4.5)
Glucose: 105 mg/dL — ABNORMAL HIGH (ref 70–99)
Potassium: 4.8 mmol/L (ref 3.5–5.2)
Sodium: 141 mmol/L (ref 134–144)
Total Protein: 6.9 g/dL (ref 6.0–8.5)
eGFR: 105 mL/min/{1.73_m2} (ref >59)

## 2021-10-17 LAB — VITAMIN D 25 HYDROXY (VIT D DEFICIENCY, FRACTURES): Vit D, 25-Hydroxy: 30 ng/mL (ref 30.0–100.0)

## 2021-10-17 LAB — TSH: TSH: 2.59 u[IU]/mL (ref 0.450–4.500)

## 2021-10-28 ENCOUNTER — Other Ambulatory Visit: Payer: Self-pay

## 2021-10-28 MED ORDER — FUROSEMIDE 20 MG PO TABS: 20.0000 mg | ORAL_TABLET | ORAL | 0 refills | Status: DC | PRN

## 2021-10-30 ENCOUNTER — Ambulatory Visit: Payer: Medicare HMO | Admitting: Nurse Practitioner

## 2021-10-31 DIAGNOSIS — E782 Mixed hyperlipidemia: Secondary | ICD-10-CM | POA: Diagnosis not present

## 2021-10-31 LAB — LIPID PANEL
Chol/HDL Ratio: 2.5 ratio (ref 0.0–4.4)
Cholesterol, Total: 276 mg/dL — ABNORMAL HIGH (ref 100–199)
HDL: 112 mg/dL (ref >39)
LDL Chol Calc (NIH): 145 mg/dL — ABNORMAL HIGH (ref 0–99)
Triglycerides: 114 mg/dL (ref 0–149)
VLDL Cholesterol Cal: 19 mg/dL (ref 5–40)

## 2021-11-06 ENCOUNTER — Ambulatory Visit: Payer: Medicare HMO | Admitting: Nurse Practitioner

## 2021-11-14 ENCOUNTER — Ambulatory Visit: Payer: Medicare HMO | Admitting: Nurse Practitioner

## 2021-11-25 ENCOUNTER — Encounter: Payer: Self-pay | Admitting: Family Medicine

## 2021-11-25 ENCOUNTER — Ambulatory Visit (INDEPENDENT_AMBULATORY_CARE_PROVIDER_SITE_OTHER): Payer: Medicare HMO | Admitting: Family Medicine

## 2021-11-25 VITALS — BP 122/72 | HR 84 | Temp 97.1°F | Resp 16 | Ht 63.0 in | Wt 221.0 lb

## 2021-11-25 DIAGNOSIS — F331 Major depressive disorder, recurrent, moderate: Secondary | ICD-10-CM

## 2021-11-25 DIAGNOSIS — R42 Dizziness and giddiness: Secondary | ICD-10-CM | POA: Insufficient documentation

## 2021-11-25 DIAGNOSIS — F411 Generalized anxiety disorder: Secondary | ICD-10-CM

## 2021-11-25 DIAGNOSIS — R69 Illness, unspecified: Secondary | ICD-10-CM | POA: Diagnosis not present

## 2021-11-25 DIAGNOSIS — M25561 Pain in right knee: Secondary | ICD-10-CM | POA: Diagnosis not present

## 2021-11-25 HISTORY — DX: Dizziness and giddiness: R42

## 2021-11-25 MED ORDER — MECLIZINE HCL 25 MG PO TABS: 25.0000 mg | ORAL_TABLET | Freq: Three times a day (TID) | ORAL | 0 refills | Status: DC | PRN

## 2021-11-25 MED ORDER — FUROSEMIDE 20 MG PO TABS: 20.0000 mg | ORAL_TABLET | ORAL | 0 refills | Status: DC | PRN

## 2021-11-25 NOTE — Progress Notes (Signed)
Acute Office Visit  Subjective:    Patient ID: [name], female    DOB: Aug 17, 1965, 56 y.o.   MRN: [id number]  Chief Complaint  Patient presents with   Otalgia    HPI: Patient is in today with complaints of discomfort in her left ear.  The symptoms started 1 month ago with congestion in her ear with occasional pain.  Her balance has been effected with 2 falls over the last month.  Dizziness comes on with change in position from sitting to standing.   Past Medical History:  Diagnosis Date   Allergic reaction to bee sting 09/09/2018   Anxiety    Asthma    Bacterial vaginosis 07/07/2019   Bulging lumbar disc 07/22/2015   Cardiomyopathy ([place]) 07/18/2015   Overview:  Overview:  [place] 2009, normal coronaries  Last Assessment & Plan:  Relevant Hx: Course: Daily Update: Today's Plan: she has recently been seen by cards and is planned to have echo this Friday  Electronically signed by: Baldemar Friday, [place] 07/24/15 1042   Carpal tunnel syndrome 07/22/2015   Cervical strain 01/11/2019   Chronic fatigue 07/22/2015   Chronic rhinitis 07/22/2015   Depression    Dyslipidemia 07/18/2015   Esophageal stricture 07/22/2015   Essential hypertension    Eustachian tube dysfunction 07/22/2015   Excessive daytime sleepiness 05/16/2019   Gastro-esophageal reflux disease without esophagitis    Gastroesophageal reflux disease without esophagitis 07/18/2015   Last Assessment & Plan:  Relevant Hx: Course: Daily Update: Today's Plan:appears stable with ppi  Electronically signed by: Baldemar Friday, [place] 07/24/15 1043   High risk medication use 07/22/2015   History of blood clots    Hot flash, menopausal 07/22/2015   Hyperlipidemia 07/22/2015   Last Assessment & Plan:  Relevant Hx: Course: Daily Update: Today's Plan:stable with meds  Electronically signed by: Baldemar Friday, [place] 07/24/15 1112   Hypertension    Localized swelling of both lower legs 07/22/2015   Localized swelling of both lower  legs 07/22/2015   Low back strain, initial encounter 07/07/2019   Lung mass    Mixed hyperlipidemia 07/22/2015   Last Assessment & Plan:  Formatting of this note might be different from the original. Relevant Hx: Course: Daily Update: Today's Plan:stable with meds  Electronically signed by: Baldemar Friday, [place] 07/24/15 1112   Morbid obesity ([place]) 07/22/2015   Other insomnia 11/22/2018   Pneumonia due to COVID-19 virus 01/17/2020   Pulmonary embolus ([place]) 01/18/2020   Screening for osteoporosis 02/27/2017   Formatting of this note might be different from the original. 2019: spine 3.1, L femur 1.3   Seasonal allergic rhinitis due to pollen 07/22/2015   Snores 07/22/2015   Last Assessment & Plan:  Relevant Hx: Course: Daily Update: Today's Plan:she was to have psg, but has not had this yet. Discussed that if this is present could be worse post-op with pain meds. None the less, she will have protective airway with/during surgery. She should still have psg study, but wants to wait until after knee repair  Electronically signed by: Baldemar Friday, FNP 07/24/15    Past Surgical History:  Procedure Laterality Date   ABDOMINAL HYSTERECTOMY     CARPAL TUNNEL RELEASE Right    CHOLECYSTECTOMY     ECTOPIC PREGNANCY SURGERY     X 2   FOOT SURGERY     Knee replcement     POLYPECTOMY     TONSILECTOMY/ADENOIDECTOMY WITH MYRINGOTOMY  1972   TOTAL  HIP ARTHROPLASTY Right    WISDOM TOOTH EXTRACTION      Family History  Problem Relation Age of Onset   Heart attack Father    Heart disease Father    Dementia Mother    Diabetes Half-Sister    Leukemia Half-Brother    Multiple myeloma Half-Brother     Social History   Socioeconomic History   Marital status: Divorced    Spouse name: NONE   Number of children: 0   Years of education: 49 +  [employer]   Highest education level: Not on file  Occupational History   Occupation: DIABLED  Tobacco Use   Smoking status: Former    Types:  Cigarettes    Quit date: 06/16/2006    Years since quitting: 15.4   Smokeless tobacco: Never  Vaping Use   Vaping Use: Never used  Substance and Sexual Activity   Alcohol use: Yes    Alcohol/week: 3.0 - 4.0 standard drinks of alcohol    Types: 3 - 4 Cans of beer per week   Drug use: No   Sexual activity: Yes  Other Topics Concern   Not on file  Social History Narrative   Not on file   Social Determinants of Health   Financial Resource Strain: Low Risk  (02/16/2021)   Overall Financial Resource Strain (CARDIA)    Difficulty of Paying Living Expenses: Not hard at all  Food Insecurity: No Food Insecurity (02/16/2021)   Hunger Vital Sign    Worried About Running Out of Food in the Last Year: Never true    [place] in the Last Year: Never true  Transportation Needs: No Transportation Needs (02/16/2021)   PRAPARE - Hydrologist (Medical): No    Lack of Transportation (Non-Medical): No  Physical Activity: Inactive (02/16/2021)   Exercise Vital Sign    Days of Exercise per Week: 0 days    Minutes of Exercise per Session: 0 min  Stress: No Stress Concern Present (02/16/2021)   Gay    Feeling of Stress : Not at all  Social Connections: Socially Isolated (02/16/2021)   Social Connection and Isolation Panel [NHANES]    Frequency of Communication with Friends and Family: More than three times a week    Frequency of Social Gatherings with Friends and Family: Twice a week    Attends Religious Services: Never    Marine scientist or Organizations: No    Attends Archivist Meetings: Never    Marital Status: Divorced  Human resources officer Violence: Not At Risk (02/16/2021)   Humiliation, Afraid, Rape, and Kick questionnaire    Fear of Current or Ex-Partner: No    Emotionally Abused: No    Physically Abused: No    Sexually Abused: No    Outpatient Medications Prior to Visit   Medication Sig Dispense Refill   albuterol (VENTOLIN HFA) 108 (90 Base) MCG/ACT inhaler Inhale 1-2 puffs into the lungs every 6 (six) hours as needed for wheezing or shortness of breath.     aspirin EC 81 MG tablet Take 81 mg by mouth daily. Swallow whole.     esomeprazole (NEXIUM) 20 MG capsule Take 20 mg by mouth daily at 12 noon.     ezetimibe (ZETIA) 10 MG tablet Take 1 tablet (10 mg total) by mouth daily. 90 tablet 3   fluticasone (FLONASE) 50 MCG/ACT nasal spray Place 1 spray into both nostrils daily.  KRILL OIL PO Take 400 mg by mouth daily.     LORazepam (ATIVAN) 0.5 MG tablet Take 1 tablet (0.5 mg total) by mouth daily as needed for anxiety (panic attacks). 30 tablet 1   Multiple Vitamin (MULTIVITAMIN) tablet Take 1 tablet by mouth daily.     nitroGLYCERIN (NITROSTAT) 0.4 MG SL tablet Place 1 tablet (0.4 mg total) under the tongue every 5 (five) minutes as needed for chest pain (x3 total. call ems if chest pain does not resolve after one, call ems.). 25 tablet 0   ondansetron (ZOFRAN) 4 MG tablet Take 1 tablet (4 mg total) by mouth every 8 (eight) hours as needed for nausea or vomiting. 20 tablet 0   amLODipine (NORVASC) 10 MG tablet TAKE 1 TABLET BY MOUTH ONCE DAILY. (Patient taking differently: Take 5 mg by mouth daily.) 90 tablet 0   furosemide (LASIX) 20 MG tablet Take 1 tablet (20 mg total) by mouth as needed for fluid or edema. 90 tablet 0   sertraline (ZOLOFT) 100 MG tablet TAKE 1- 1/2 TABLET BY MOUTH ONCE DAILY. (Patient taking differently: Take 100 mg by mouth daily.) 105 tablet 1   No facility-administered medications prior to visit.    Allergies  Allergen Reactions   Demerol Anaphylaxis   Meperidine Anaphylaxis   Sulfamethoxazole Itching    With redness to skin    Citalopram Hydrobromide Other (See Comments)    Unknown   Hydrocodone    Hydrocodone-Acetaminophen Other (See Comments)    Unknown   Metoprolol Tartrate Other (See Comments)    Chest discomfort,  palpations   Oxycodone-Acetaminophen Other (See Comments)    Unknown   Rosuvastatin Other (See Comments)    myalgias   Diltiazem Rash   Metoprolol Tartrate Palpitations    Review of Systems  Constitutional:  Negative for fever.  HENT:  Positive for congestion and ear pain (left ear). Negative for sore throat.   Respiratory:  Positive for cough and shortness of breath.   Cardiovascular:  Negative for chest pain.  Musculoskeletal:  Positive for arthralgias (right knee pain).  Neurological:  Positive for dizziness, light-headedness and headaches.       Objective:    Physical Exam Vitals reviewed.  Constitutional:      Appearance: Normal appearance.  HENT:     Right Ear: Tympanic membrane, ear canal and external ear normal.     Left Ear: Tympanic membrane, ear canal and external ear normal.     Nose: Congestion present.     Mouth/Throat:     Pharynx: Oropharynx is clear. No oropharyngeal exudate or posterior oropharyngeal erythema.  Cardiovascular:     Rate and Rhythm: Normal rate and regular rhythm.     Heart sounds: Normal heart sounds. No murmur heard. Pulmonary:     Effort: Pulmonary effort is normal. No respiratory distress.     Breath sounds: Normal breath sounds.  Lymphadenopathy:     Cervical: No cervical adenopathy.  Neurological:     Mental Status: She is alert and oriented to person, place, and time.     Cranial Nerves: Cranial nerves 2-12 are intact.     Comments: Neg hall pike No nystagmus.  Psychiatric:        Mood and Affect: Mood normal.        Behavior: Behavior normal.     BP 122/72   Pulse 84   Temp (!) 97.1 F (36.2 C)   Resp 16   Ht $R'5\' 3"'Ar$  (1.6 m)   Wt 221  lb (100.2 kg)   BMI 39.15 kg/m  Wt Readings from Last 3 Encounters:  11/25/21 221 lb (100.2 kg)  10/16/21 221 lb (100.2 kg)  10/16/21 219 lb (99.3 kg)    There are no preventive care reminders to display for this patient.  There are no preventive care reminders to display for this  patient.   Lab Results  Component Value Date   TSH 2.590 10/16/2021   Lab Results  Component Value Date   WBC 5.8 10/16/2021   HGB 13.9 10/16/2021   HCT 39.1 10/16/2021   MCV 94 10/16/2021   PLT 309 10/16/2021   Lab Results  Component Value Date   NA 141 10/16/2021   K 4.8 10/16/2021   CO2 25 10/16/2021   GLUCOSE 105 (H) 10/16/2021   BUN 11 10/16/2021   CREATININE 0.62 10/16/2021   BILITOT 0.3 10/16/2021   ALKPHOS 113 10/16/2021   AST 21 10/16/2021   ALT 16 10/16/2021   PROT 6.9 10/16/2021   ALBUMIN 4.5 10/16/2021   CALCIUM 9.3 10/16/2021   EGFR 105 10/16/2021   Lab Results  Component Value Date   CHOL 276 (H) 10/31/2021   Lab Results  Component Value Date   HDL 112 10/31/2021   Lab Results  Component Value Date   LDLCALC 145 (H) 10/31/2021   Lab Results  Component Value Date   TRIG 114 10/31/2021   Lab Results  Component Value Date   CHOLHDL 2.5 10/31/2021   Lab Results  Component Value Date   HGBA1C 5.0 11/23/2020       Assessment & Plan:   Problem List Items Addressed This Visit       Other   Vertigo - Primary    Start meclizine.       Relevant Medications   meclizine (ANTIVERT) 25 MG tablet   GAD (generalized anxiety disorder)    The current medical regimen is effective;  continue present plan and medications.       Relevant Medications   sertraline (ZOLOFT) 100 MG tablet   Depression, major, recurrent, moderate (HCC)    The current medical regimen is effective;  continue present plan and medications.       Relevant Medications   sertraline (ZOLOFT) 100 MG tablet   Meds ordered this encounter  Medications   furosemide (LASIX) 20 MG tablet    Sig: Take 1 tablet (20 mg total) by mouth as needed for fluid or edema.    Dispense:  90 tablet    Refill:  0   meclizine (ANTIVERT) 25 MG tablet    Sig: Take 1 tablet (25 mg total) by mouth 3 (three) times daily as needed for dizziness.    Dispense:  30 tablet    Refill:  0    amLODipine (NORVASC) 10 MG tablet    Sig: Take 0.5 tablets (5 mg total) by mouth daily.    Dispense:  1 tablet    Refill:  0   sertraline (ZOLOFT) 100 MG tablet    Sig: Take 1 tablet (100 mg total) by mouth daily.    Dispense:  1 tablet    Refill:  1    Follow-up: Return if symptoms worsen or fail to improve.  An After Visit Summary was printed and given to the patient.  Rochel Brome, MD [facility] [phone]

## 2021-11-30 DIAGNOSIS — F331 Major depressive disorder, recurrent, moderate: Secondary | ICD-10-CM | POA: Insufficient documentation

## 2021-11-30 DIAGNOSIS — F411 Generalized anxiety disorder: Secondary | ICD-10-CM | POA: Insufficient documentation

## 2021-11-30 DIAGNOSIS — F330 Major depressive disorder, recurrent, mild: Secondary | ICD-10-CM | POA: Insufficient documentation

## 2021-11-30 MED ORDER — AMLODIPINE BESYLATE 10 MG PO TABS: 5.0000 mg | ORAL_TABLET | Freq: Every day | ORAL | 0 refills | Status: DC

## 2021-11-30 MED ORDER — SERTRALINE HCL 100 MG PO TABS: 100.0000 mg | ORAL_TABLET | Freq: Every day | ORAL | 1 refills | Status: DC

## 2021-11-30 NOTE — Assessment & Plan Note (Signed)
The current medical regimen is effective;  continue present plan and medications.  

## 2021-11-30 NOTE — Assessment & Plan Note (Signed)
Start meclizine.

## 2021-12-16 ENCOUNTER — Telehealth: Payer: Medicare HMO | Admitting: Internal Medicine

## 2021-12-19 DIAGNOSIS — H524 Presbyopia: Secondary | ICD-10-CM | POA: Diagnosis not present

## 2021-12-30 DIAGNOSIS — M1812 Unilateral primary osteoarthritis of first carpometacarpal joint, left hand: Secondary | ICD-10-CM | POA: Diagnosis not present

## 2022-01-09 ENCOUNTER — Inpatient Hospital Stay: Admission: RE | Admit: 2022-01-09 | Payer: Medicare HMO | Source: Ambulatory Visit

## 2022-01-13 ENCOUNTER — Inpatient Hospital Stay: Admission: RE | Admit: 2022-01-13 | Payer: Medicare HMO | Source: Ambulatory Visit

## 2022-02-12 ENCOUNTER — Other Ambulatory Visit: Payer: Self-pay | Admitting: Nurse Practitioner

## 2022-02-12 ENCOUNTER — Ambulatory Visit
Admission: RE | Admit: 2022-02-12 | Discharge: 2022-02-12 | Disposition: A | Discharge status: Home / Self Care | Payer: Medicare HMO | Source: Ambulatory Visit | Attending: Nurse Practitioner | Admitting: Nurse Practitioner

## 2022-02-12 DIAGNOSIS — Z1231 Encounter for screening mammogram for malignant neoplasm of breast: Secondary | ICD-10-CM

## 2022-02-19 DIAGNOSIS — I499 Cardiac arrhythmia, unspecified: Secondary | ICD-10-CM | POA: Diagnosis not present

## 2022-02-19 DIAGNOSIS — Z008 Encounter for other general examination: Secondary | ICD-10-CM | POA: Diagnosis not present

## 2022-02-19 DIAGNOSIS — M1990 Unspecified osteoarthritis, unspecified site: Secondary | ICD-10-CM | POA: Diagnosis not present

## 2022-02-19 DIAGNOSIS — I700 Atherosclerosis of aorta: Secondary | ICD-10-CM | POA: Diagnosis not present

## 2022-02-19 DIAGNOSIS — I252 Old myocardial infarction: Secondary | ICD-10-CM | POA: Diagnosis not present

## 2022-02-19 DIAGNOSIS — I25119 Atherosclerotic heart disease of native coronary artery with unspecified angina pectoris: Secondary | ICD-10-CM | POA: Diagnosis not present

## 2022-02-19 DIAGNOSIS — I10 Essential (primary) hypertension: Secondary | ICD-10-CM | POA: Diagnosis not present

## 2022-02-19 DIAGNOSIS — R69 Illness, unspecified: Secondary | ICD-10-CM | POA: Diagnosis not present

## 2022-02-19 DIAGNOSIS — E785 Hyperlipidemia, unspecified: Secondary | ICD-10-CM | POA: Diagnosis not present

## 2022-02-19 DIAGNOSIS — K219 Gastro-esophageal reflux disease without esophagitis: Secondary | ICD-10-CM | POA: Diagnosis not present

## 2022-02-19 DIAGNOSIS — J45909 Unspecified asthma, uncomplicated: Secondary | ICD-10-CM | POA: Diagnosis not present

## 2022-03-10 ENCOUNTER — Other Ambulatory Visit: Payer: Self-pay | Admitting: Family Medicine

## 2022-03-19 ENCOUNTER — Ambulatory Visit: Payer: Medicare HMO | Admitting: Internal Medicine

## 2022-04-16 ENCOUNTER — Ambulatory Visit: Payer: Medicare HMO | Admitting: Family Medicine

## 2022-04-17 ENCOUNTER — Encounter: Payer: Self-pay | Admitting: Nurse Practitioner

## 2022-04-17 ENCOUNTER — Ambulatory Visit (INDEPENDENT_AMBULATORY_CARE_PROVIDER_SITE_OTHER): Payer: Medicare HMO | Admitting: Nurse Practitioner

## 2022-04-17 VITALS — BP 130/86 | HR 92 | Temp 97.2°F | Ht 63.0 in | Wt 219.0 lb

## 2022-04-17 DIAGNOSIS — R3911 Hesitancy of micturition: Secondary | ICD-10-CM

## 2022-04-17 DIAGNOSIS — J018 Other acute sinusitis: Secondary | ICD-10-CM

## 2022-04-17 DIAGNOSIS — Z8619 Personal history of other infectious and parasitic diseases: Secondary | ICD-10-CM

## 2022-04-17 DIAGNOSIS — R051 Acute cough: Secondary | ICD-10-CM

## 2022-04-17 DIAGNOSIS — J4521 Mild intermittent asthma with (acute) exacerbation: Secondary | ICD-10-CM | POA: Diagnosis not present

## 2022-04-17 LAB — POCT URINALYSIS DIP (CLINITEK)
Blood, UA: NEGATIVE
Glucose, UA: NEGATIVE mg/dL
Ketones, POC UA: NEGATIVE mg/dL
Leukocytes, UA: NEGATIVE
Nitrite, UA: NEGATIVE
Spec Grav, UA: >=1.03 — AB (ref 1.010–1.025)
Urobilinogen, UA: 0.2 E.U./dL
pH, UA: 5.5 (ref 5.0–8.0)

## 2022-04-17 LAB — POC COVID19 BINAXNOW: SARS Coronavirus 2 Ag: NEGATIVE

## 2022-04-17 MED ORDER — TRELEGY ELLIPTA 200-62.5-25 MCG/ACT IN AEPB: 1.0000 | INHALATION_SPRAY | Freq: Every day | RESPIRATORY_TRACT | 11 refills | Status: AC

## 2022-04-17 MED ORDER — AMOXICILLIN 875 MG PO TABS: 875.0000 mg | ORAL_TABLET | Freq: Two times a day (BID) | ORAL | 0 refills | Status: DC

## 2022-04-17 MED ORDER — TRELEGY ELLIPTA 200-62.5-25 MCG/ACT IN AEPB: 1.0000 | INHALATION_SPRAY | Freq: Every day | RESPIRATORY_TRACT | 0 refills | Status: DC

## 2022-04-17 MED ORDER — FLUCONAZOLE 150 MG PO TABS: 150.0000 mg | ORAL_TABLET | Freq: Once | ORAL | 0 refills | Status: AC

## 2022-04-17 NOTE — Patient Instructions (Signed)
Take Amoxicillin twice daily for 10 days Rest and push fluids Use Trelegy inhaler daily, rinse mouth afterwards Take Diflucan 150 mg after completing antibiotics  Follow-up as needed   Asthma Attack Prevention, Adult Although you may not be able to change the fact that you have asthma, you can take actions to prevent episodes of asthma (asthma attacks). How can this condition affect me? Asthma attacks (flare ups) can cause trouble breathing, high-pitched whistling sounds when you breathe, most often when you breathe out (wheeze), and coughing. They may keep you from doing activities you like to do. What can increase my risk? Coming into contact with things that cause asthma symptoms (asthma triggers) can put you at risk for an asthma attack. Common asthma triggers include: Things you are allergic to (allergens), such as: Dust mite and cockroach droppings. Pet dander. Mold. Pollen from trees and grasses. Food allergies. This might be a specific food or added chemicals called sulfites. Irritants, such as: Weather changes including very cold, dry, or humid air. Smoke. This includes campfire smoke, air pollution, and tobacco smoke. Strong odors from aerosol sprays and fumes from perfume, candles, and household cleaners. Other triggers, such as: Certain medicines. This includes NSAIDs, such as ibuprofen and aspirin. Viral respiratory infections (colds), including runny nose (rhinitis) or infection in the sinuses (sinusitis). Activity, including exercise, laughing, or crying. Not using inhaled medicines (corticosteroids) as told. What actions can I take to prevent an asthma attack? Stay healthy. Stay up to date on all immunizations as told by your health care provider, including the yearly flu (influenza) vaccine and pneumonia vaccine. Many asthma attacks can be prevented by carefully following your written asthma action plan. Follow your asthma action plan Work with your health care  provider to create a written asthma action plan. This plan should include: A list of your asthma triggers and how to avoid or reduce them. A list of symptoms that you may have during an asthma attack. Information about which medicine to take, when to take the medicine, and how much of the medicine to take. Information to help you understand your peak flow measurements. Daily actions that you can take to prevent (control) your asthma symptoms. Contact information for your health care providers. If you have an asthma attack, act quickly. Follow the emergency steps on your written asthma action plan. This may prevent you from needing to go to the hospital. Monitor your asthma. To do this: Use your peak flow meter every morning and every evening for 2-3 weeks or as told by your health care provider. Record the results in a journal. A drop in your peak flow numbers on one or more days may mean that you are starting to have an asthma attack, even if you are not having symptoms. When you have asthma symptoms, write them down in a journal. Write down in your journal how often you need to use your fast-acting rescue inhaler. If you are using your rescue inhaler more often, it may mean that your asthma is not under control. Talk with your health care provider about adjusting your asthma treatment plan to help you prevent future asthma attacks and gain better control of your condition.  Lifestyle Avoid or reduce contact with known outdoor allergens by staying indoors, keeping windows closed, and using air conditioning when pollen and mold counts are high. Do not use any products that contain nicotine or tobacco. These products include cigarettes, chewing tobacco, and vaping devices, such as e-cigarettes. If you need help quitting,  ask your health care provider. If you are overweight, consider a weight-loss plan. Find ways to cope with stress and your feelings, such as mindfulness, relaxation, or breathing  exercises. Ask your health care provider if a breathing exercise program (pulmonary rehabilitation) may be helpful to control symptoms and improve your quality of life. Medicines  Take over-the-counter and prescription medicines only as told by your health care provider. Do not stop taking your medicine and do not take less medicine even if you start to feel better. Let your health care provider know: How often you use your rescue inhaler. How often you have symptoms when you are taking your regular medicines. If you wake up at night because of asthma symptoms. If you have more trouble with your breathing when you exercise. Activity Do your normal activities as told by your health care provider. Ask your health care provider what activities are safe for you. Some people have asthma symptoms or more asthma symptoms when they exercise. This is called exercise-induced bronchoconstriction (EIB). If you have this problem, talk with your health care provider about how to manage EIB. Some tips to follow include: Use your fast-acting inhaler before exercise. Exercise indoors if it is very cold, humid, or the pollen and mold counts are high. Warm up and cool down before and after exercise. Stop exercising right away if your asthma symptoms start or get worse. Where to find more information Asthma and Allergy Foundation of America: www.aafa.org Centers for Disease Control and Prevention: FootballExhibition.com.br American Lung Association: www.lung.org National Heart, Lung, and Blood Institute: PopSteam.is World Health Organization: https://castaneda-walker.com/ Get help right away if: You have followed your written asthma action plan and your symptoms are not improving. Summary Asthma attacks (flare ups) can cause trouble breathing, high-pitched whistling sounds when you breathe, most often when you breathe out (wheeze), and coughing. Work with your health care provider to create a written asthma action plan. Do not stop  taking your medicine and do not take less medicine even if you are feeling better. Do not use any products that contain nicotine or tobacco. These products include cigarettes, chewing tobacco, and vaping devices, such as e-cigarettes. If you need help quitting, ask your health care provider. This information is not intended to replace advice given to you by your health care provider. Make sure you discuss any questions you have with your health care provider. Document Revised: 11/28/2020 Document Reviewed: 11/28/2020 Elsevier Patient Education  2023 Elsevier Inc.   Sinus Infection, Adult A sinus infection is soreness and swelling (inflammation) of your sinuses. Sinuses are hollow spaces in the bones around your face. They are located: Around your eyes. In the middle of your forehead. Behind your nose. In your cheekbones. Your sinuses and nasal passages are lined with a fluid called mucus. Mucus drains out of your sinuses. Swelling can trap mucus in your sinuses. This lets germs (bacteria, virus, or fungus) grow, which leads to infection. Most of the time, this condition is caused by a virus. What are the causes? Allergies. Asthma. Germs. Things that block your nose or sinuses. Growths in the nose (nasal polyps). Chemicals or irritants in the air. A fungus. This is rare. What increases the risk? Having a weak body defense system (immune system). Doing a lot of swimming or diving. Using nasal sprays too much. Smoking. What are the signs or symptoms? The main symptoms of this condition are pain and a feeling of pressure around the sinuses. Other symptoms include: Stuffy nose (congestion). This may  make it hard to breathe through your nose. Runny nose (drainage). Soreness, swelling, and warmth in the sinuses. A cough that may get worse at night. Being unable to smell and taste. Mucus that collects in the throat or the back of the nose (postnasal drip). This may cause a sore throat or  bad breath. Being very tired (fatigued). A fever. How is this diagnosed? Your symptoms. Your medical history. A physical exam. Tests to find out if your condition is short-term (acute) or long-term (chronic). Your doctor may: Check your nose for growths (polyps). Check your sinuses using a tool that has a light on one end (endoscope). Check for allergies or germs. Do imaging tests, such as an MRI or CT scan. How is this treated? Treatment for this condition depends on the cause and whether it is short-term or long-term. If caused by a virus, your symptoms should go away on their own within 10 days. You may be given medicines to relieve symptoms. They include: Medicines that shrink swollen tissue in the nose. A spray that treats swelling of the nostrils. Rinses that help get rid of thick mucus in your nose (nasal saline washes). Medicines that treat allergies (antihistamines). Over-the-counter pain relievers. If caused by bacteria, your doctor may wait to see if you will get better without treatment. You may be given antibiotic medicine if you have: A very bad infection. A weak body defense system. If caused by growths in the nose, surgery may be needed. Follow these instructions at home: Medicines Take, use, or apply over-the-counter and prescription medicines only as told by your doctor. These may include nasal sprays. If you were prescribed an antibiotic medicine, take it as told by your doctor. Do not stop taking it even if you start to feel better. Hydrate and humidify  Drink enough water to keep your pee (urine) pale yellow. Use a cool mist humidifier to keep the humidity level in your home above 50%. Breathe in steam for 10-15 minutes, 3-4 times a day, or as told by your doctor. You can do this in the bathroom while a hot shower is running. Try not to spend time in cool or dry air. Rest Rest as much as you can. Sleep with your head raised (elevated). Make sure you get  enough sleep each night. General instructions  Put a warm, moist washcloth on your face 3-4 times a day, or as often as told by your doctor. Use nasal saline washes as often as told by your doctor. Wash your hands often with soap and water. If you cannot use soap and water, use hand sanitizer. Do not smoke. Avoid being around people who are smoking (secondhand smoke). Keep all follow-up visits. Contact a doctor if: You have a fever. Your symptoms get worse. Your symptoms do not get better within 10 days. Get help right away if: You have a very bad headache. You cannot stop vomiting. You have very bad pain or swelling around your face or eyes. You have trouble seeing. You feel confused. Your neck is stiff. You have trouble breathing. These symptoms may be an emergency. Get help right away. Call 911. Do not wait to see if the symptoms will go away. Do not drive yourself to the hospital. Summary A sinus infection is swelling of your sinuses. Sinuses are hollow spaces in the bones around your face. This condition is caused by tissues in your nose that become inflamed or swollen. This traps germs. These can lead to infection. If you were  prescribed an antibiotic medicine, take it as told by your doctor. Do not stop taking it even if you start to feel better. Keep all follow-up visits. This information is not intended to replace advice given to you by your health care provider. Make sure you discuss any questions you have with your health care provider. Document Revised: 05/07/2021 Document Reviewed: 05/07/2021 Elsevier Patient Education  2023 [employer].

## 2022-04-17 NOTE — Progress Notes (Signed)
Acute Office Visit  Subjective:    Patient ID: [name], female    DOB: 1965/10/12, 56 y.o.   MRN: [id number]  CC: URI  HPI: Patient is in today for Upper respiratory symptoms She complains of chest congestion, nasal congestion, non productive cough, shortness of breath, and sinus pressure. Denies fever, chills, night sweats or weight loss. Onset of symptoms was  1-2 weeks ago and improving.She is drinking plenty of fluids.  Past history is significant for no history of pneumonia or bronchitis. Patient is former smoker. Currently vapes nicotine.  Urinary symptoms  She reports new onset urinary hesitancy. The current episode started  1 months ago and is staying constant. Patient states symptoms are mild in intensity, occurring intermittently. She  has not been recently treated for similar symptoms.        Past Medical History:  Diagnosis Date   Allergic reaction to bee sting 09/09/2018   Anxiety    Asthma    Bacterial vaginosis 07/07/2019   Bulging lumbar disc 07/22/2015   Cardiomyopathy ([place]) 07/18/2015   Overview:  Overview:  Taka Tsubo 2009, normal coronaries  Last Assessment & Plan:  Relevant Hx: Course: Daily Update: Today's Plan: she has recently been seen by cards and is planned to have echo this Friday  Electronically signed by: Baldemar Friday, [place] 07/24/15 1042   Carpal tunnel syndrome 07/22/2015   Cervical strain 01/11/2019   Chronic fatigue 07/22/2015   Chronic rhinitis 07/22/2015   Depression    Dyslipidemia 07/18/2015   Esophageal stricture 07/22/2015   Essential hypertension    Eustachian tube dysfunction 07/22/2015   Excessive daytime sleepiness 05/16/2019   Gastro-esophageal reflux disease without esophagitis    Gastroesophageal reflux disease without esophagitis 07/18/2015   Last Assessment & Plan:  Relevant Hx: Course: Daily Update: Today's Plan:appears stable with ppi  Electronically signed by: Baldemar Friday, [place] 07/24/15 1043   High risk medication  use 07/22/2015   History of blood clots    Hot flash, menopausal 07/22/2015   Hyperlipidemia 07/22/2015   Last Assessment & Plan:  Relevant Hx: Course: Daily Update: Today's Plan:stable with meds  Electronically signed by: Baldemar Friday, [place] 07/24/15 1112   Hypertension    Localized swelling of both lower legs 07/22/2015   Localized swelling of both lower legs 07/22/2015   Low back strain, initial encounter 07/07/2019   Lung mass    Mixed hyperlipidemia 07/22/2015   Last Assessment & Plan:  Formatting of this note might be different from the original. Relevant Hx: Course: Daily Update: Today's Plan:stable with meds  Electronically signed by: Baldemar Friday, [place] 07/24/15 1112   Morbid obesity ([place]) 07/22/2015   Other insomnia 11/22/2018   Pneumonia due to COVID-19 virus 01/17/2020   Pulmonary embolus ([place]) 01/18/2020   Screening for osteoporosis 02/27/2017   Formatting of this note might be different from the original. 2019: spine 3.1, L femur 1.3   Seasonal allergic rhinitis due to pollen 07/22/2015   Snores 07/22/2015   Last Assessment & Plan:  Relevant Hx: Course: Daily Update: Today's Plan:she was to have psg, but has not had this yet. Discussed that if this is present could be worse post-op with pain meds. None the less, she will have protective airway with/during surgery. She should still have psg study, but wants to wait until after knee repair  Electronically signed by: Baldemar Friday, FNP 07/24/15    Past Surgical History:  Procedure Laterality Date   ABDOMINAL HYSTERECTOMY  CARPAL TUNNEL RELEASE Right    CHOLECYSTECTOMY     ECTOPIC PREGNANCY SURGERY     X 2   FOOT SURGERY     Knee replcement     POLYPECTOMY     TONSILECTOMY/ADENOIDECTOMY WITH MYRINGOTOMY  1972   TOTAL HIP ARTHROPLASTY Right    WISDOM TOOTH EXTRACTION      Family History  Problem Relation Age of Onset   Dementia Mother    Heart attack Father    Heart disease Father    Leukemia Half-Brother     Multiple myeloma Half-Brother    Diabetes Half-Sister    Breast cancer Neg Hx     Social History   Socioeconomic History   Marital status: Divorced    Spouse name: NONE   Number of children: 0   Years of education: 66 +  [employer]   Highest education level: Not on file  Occupational History   Occupation: DIABLED  Tobacco Use   Smoking status: Former    Types: Cigarettes    Quit date: 06/16/2006    Years since quitting: 15.8   Smokeless tobacco: Never  Vaping Use   Vaping Use: Never used  Substance and Sexual Activity   Alcohol use: Yes    Alcohol/week: 3.0 - 4.0 standard drinks of alcohol    Types: 3 - 4 Cans of beer per week   Drug use: No   Sexual activity: Yes  Other Topics Concern   Not on file  Social History Narrative   Not on file   Social Determinants of Health   Financial Resource Strain: Low Risk  (02/16/2021)   Overall Financial Resource Strain (CARDIA)    Difficulty of Paying Living Expenses: Not hard at all  Food Insecurity: No Food Insecurity (02/16/2021)   Hunger Vital Sign    Worried About Running Out of Food in the Last Year: Never true    [place] in the Last Year: Never true  Transportation Needs: No Transportation Needs (02/16/2021)   PRAPARE - Hydrologist (Medical): No    Lack of Transportation (Non-Medical): No  Physical Activity: Inactive (02/16/2021)   Exercise Vital Sign    Days of Exercise per Week: 0 days    Minutes of Exercise per Session: 0 min  Stress: No Stress Concern Present (02/16/2021)   [place]    Feeling of Stress : Not at all  Social Connections: Socially Isolated (02/16/2021)   Social Connection and Isolation Panel [NHANES]    Frequency of Communication with Friends and Family: More than three times a week    Frequency of Social Gatherings with Friends and Family: Twice a week    Attends Religious Services: Never    Building surveyor or Organizations: No    Attends Archivist Meetings: Never    Marital Status: Divorced  Human resources officer Violence: Not At Risk (02/16/2021)   Humiliation, Afraid, Rape, and Kick questionnaire    Fear of Current or Ex-Partner: No    Emotionally Abused: No    Physically Abused: No    Sexually Abused: No    Outpatient Medications Prior to Visit  Medication Sig Dispense Refill   albuterol (VENTOLIN HFA) 108 (90 Base) MCG/ACT inhaler Inhale 1-2 puffs into the lungs every 6 (six) hours as needed for wheezing or shortness of breath.     amLODipine (NORVASC) 10 MG tablet TAKE 1 TABLET BY MOUTH ONCE DAILY.  90 tablet 1   aspirin EC 81 MG tablet Take 81 mg by mouth daily. Swallow whole.     esomeprazole (NEXIUM) 20 MG capsule Take 20 mg by mouth daily at 12 noon.     ezetimibe (ZETIA) 10 MG tablet Take 1 tablet (10 mg total) by mouth daily. 90 tablet 3   fluticasone (FLONASE) 50 MCG/ACT nasal spray Place 1 spray into both nostrils daily.     furosemide (LASIX) 20 MG tablet Take 1 tablet (20 mg total) by mouth as needed for fluid or edema. 90 tablet 0   KRILL OIL PO Take 400 mg by mouth daily.     LORazepam (ATIVAN) 0.5 MG tablet Take 1 tablet (0.5 mg total) by mouth daily as needed for anxiety (panic attacks). 30 tablet 1   meclizine (ANTIVERT) 25 MG tablet Take 1 tablet (25 mg total) by mouth 3 (three) times daily as needed for dizziness. 30 tablet 0   Multiple Vitamin (MULTIVITAMIN) tablet Take 1 tablet by mouth daily.     nitroGLYCERIN (NITROSTAT) 0.4 MG SL tablet Place 1 tablet (0.4 mg total) under the tongue every 5 (five) minutes as needed for chest pain (x3 total. call ems if chest pain does not resolve after one, call ems.). 25 tablet 0   ondansetron (ZOFRAN) 4 MG tablet Take 1 tablet (4 mg total) by mouth every 8 (eight) hours as needed for nausea or vomiting. 20 tablet 0   sertraline (ZOLOFT) 100 MG tablet Take 1 tablet (100 mg total) by mouth daily. 1 tablet 1   No  facility-administered medications prior to visit.    Allergies  Allergen Reactions   Demerol Anaphylaxis   Meperidine Anaphylaxis   Sulfamethoxazole Itching    With redness to skin    Citalopram Hydrobromide Other (See Comments)    Unknown   Hydrocodone    Hydrocodone-Acetaminophen Other (See Comments)    Unknown   Metoprolol Tartrate Other (See Comments)    Chest discomfort, palpations   Oxycodone-Acetaminophen Other (See Comments)    Unknown   Rosuvastatin Other (See Comments)    myalgias   Diltiazem Rash   Metoprolol Tartrate Palpitations    Review of Systems See pertinent positives and negatives per HPI.     Objective:    Physical Exam Vitals reviewed.  HENT:     Right Ear: Tympanic membrane normal.     Left Ear: Tympanic membrane normal.     Nose: Congestion and rhinorrhea present.     Mouth/Throat:     Pharynx: Posterior oropharyngeal erythema present.  Cardiovascular:     Rate and Rhythm: Normal rate and regular rhythm.     Pulses: Normal pulses.     Heart sounds: Normal heart sounds.  Pulmonary:     Breath sounds: Wheezing and rhonchi present.  Skin:    Capillary Refill: Capillary refill takes less than 2 seconds.  Neurological:     Mental Status: She is alert.     BP 130/86   Pulse 92   Temp (!) 97.2 F (36.2 C)   Ht _0  (1.6 m)   Wt 219 lb (99.3 kg)   SpO2 98%   BMI 38.79 kg/m   Wt Readings from Last 3 Encounters:  11/25/21 221 lb (100.2 kg)  10/16/21 221 lb (100.2 kg)  10/16/21 219 lb (99.3 kg)    Health Maintenance Due  Topic Date Due   TETANUS/TDAP  Never done   Zoster Vaccines- Shingrix (1 of 2) Never done   INFLUENZA  VACCINE  01/14/2022   Medicare Annual Wellness (AWV)  02/16/2022   Lab Results  Component Value Date   TSH 2.590 10/16/2021   Lab Results  Component Value Date   WBC 5.8 10/16/2021   HGB 13.9 10/16/2021   HCT 39.1 10/16/2021   MCV 94 10/16/2021   PLT 309 10/16/2021   Lab Results  Component Value Date    NA 141 10/16/2021   K 4.8 10/16/2021   CO2 25 10/16/2021   GLUCOSE 105 (H) 10/16/2021   BUN 11 10/16/2021   CREATININE 0.62 10/16/2021   BILITOT 0.3 10/16/2021   ALKPHOS 113 10/16/2021   AST 21 10/16/2021   ALT 16 10/16/2021   PROT 6.9 10/16/2021   ALBUMIN 4.5 10/16/2021   CALCIUM 9.3 10/16/2021   EGFR 105 10/16/2021   Lab Results  Component Value Date   CHOL 276 (H) 10/31/2021   Lab Results  Component Value Date   HDL 112 10/31/2021   Lab Results  Component Value Date   LDLCALC 145 (H) 10/31/2021   Lab Results  Component Value Date   TRIG 114 10/31/2021   Lab Results  Component Value Date   CHOLHDL 2.5 10/31/2021   Lab Results  Component Value Date   HGBA1C 5.0 11/23/2020       Assessment & Plan:   1. Mild intermittent asthma with acute exacerbation - Fluticasone-Umeclidin-Vilant (TRELEGY ELLIPTA) 200-62.5-25 MCG/ACT AEPB; Inhale 1 Inhalation into the lungs daily.  Dispense: 1 each; Refill: 11 - Fluticasone-Umeclidin-Vilant (TRELEGY ELLIPTA) 200-62.5-25 MCG/ACT AEPB; Inhale 1 Inhalation into the lungs daily.  Dispense: 2 each; Refill: 0  2. Acute non-recurrent sinusitis of other sinus - amoxicillin (AMOXIL) 875 MG tablet; Take 1 tablet (875 mg total) by mouth 2 (two) times daily for 10 days.  Dispense: 20 tablet; Refill: 0  3. Urinary hesitancy - POCT URINALYSIS DIP (CLINITEK)  4. Acute cough - POC COVID-19 BinaxNow  5. History of candidiasis of vagina - fluconazole (DIFLUCAN) 150 MG tablet; Take 1 tablet (150 mg total) by mouth once for 1 dose.  Dispense: 1 tablet; Refill: 0     Take Amoxicillin twice daily for 10 days Rest and push fluids Use Trelegy inhaler daily, rinse mouth afterwards Take Diflucan 150 mg after completing antibiotics  Follow-up as needed     Follow-up: PRN  An After Visit Summary was printed and given to the patient.  I, Rip Harbour, NP, have reviewed all documentation for this visit. The documentation on 04/17/22 for  the exam, diagnosis, procedures, and orders are all accurate and complete.    Signed, Rip Harbour, NP [place] [phone]

## 2022-04-24 ENCOUNTER — Ambulatory Visit (INDEPENDENT_AMBULATORY_CARE_PROVIDER_SITE_OTHER): Payer: Medicare HMO | Admitting: Family Medicine

## 2022-04-24 VITALS — BP 132/76 | HR 84 | Temp 97.4°F | Resp 16 | Ht 63.0 in | Wt 220.0 lb

## 2022-04-24 DIAGNOSIS — Z6838 Body mass index (BMI) 38.0-38.9, adult: Secondary | ICD-10-CM

## 2022-04-24 DIAGNOSIS — J4520 Mild intermittent asthma, uncomplicated: Secondary | ICD-10-CM

## 2022-04-24 DIAGNOSIS — I420 Dilated cardiomyopathy: Secondary | ICD-10-CM

## 2022-04-24 DIAGNOSIS — I10 Essential (primary) hypertension: Secondary | ICD-10-CM

## 2022-04-24 DIAGNOSIS — E782 Mixed hyperlipidemia: Secondary | ICD-10-CM | POA: Diagnosis not present

## 2022-04-24 DIAGNOSIS — F331 Major depressive disorder, recurrent, moderate: Secondary | ICD-10-CM | POA: Diagnosis not present

## 2022-04-24 DIAGNOSIS — F411 Generalized anxiety disorder: Secondary | ICD-10-CM | POA: Diagnosis not present

## 2022-04-24 DIAGNOSIS — Z23 Encounter for immunization: Secondary | ICD-10-CM | POA: Diagnosis not present

## 2022-04-24 DIAGNOSIS — R69 Illness, unspecified: Secondary | ICD-10-CM | POA: Diagnosis not present

## 2022-04-24 DIAGNOSIS — F330 Major depressive disorder, recurrent, mild: Secondary | ICD-10-CM

## 2022-04-24 MED ORDER — LORAZEPAM 0.5 MG PO TABS: 0.5000 mg | ORAL_TABLET | Freq: Every day | ORAL | 1 refills | Status: DC | PRN

## 2022-04-24 NOTE — Patient Instructions (Signed)
Recommend get tetanus (TDAP) at the pharmacy.

## 2022-04-24 NOTE — Progress Notes (Signed)
Subjective:  Patient ID: [name], female    DOB: 1965-07-03  Age: 56 y.o. MRN: [id number]  Chief Complaint  Patient presents with   Hypertension   HPI Acute bronchitis: given amoxicillin, trelegy one inhalation daily,  and ventolin HFA about once daily.  No prednisone given. Patient has not gotten better.  Increased stress. Renters are delinquent on rent and she is having to take them to court. On sertraline 100 mg once daily.      04/24/2022    1:51 PM 10/16/2021    2:49 PM  GAD 7 : Generalized Anxiety Score  Nervous, Anxious, on Edge 3 3  Control/stop worrying 1 1  Worry too much - different things 1 2  Trouble relaxing 3 1  Restless 0 1  Easily annoyed or irritable 3 2  Afraid - awful might happen 0 0  Total GAD 7 Score 11 10  Anxiety Difficulty Somewhat difficult Somewhat difficult      04/26/2022    5:22 PM 04/24/2022   11:32 PM 10/16/2021    2:47 PM  PHQ9 SCORE ONLY  PHQ-9 Total Score 0 1 15     HYPERTENSION : amlodipine 10 mg once daily, aspirin daily. Ntg on hand, but doesn't often take it. If takes 2, she will go to ED.   GERD: On nexium 20 mg once daily.    Current Outpatient Medications on File Prior to Visit  Medication Sig Dispense Refill   acidophilus (RISAQUAD) CAPS capsule Take 1 capsule by mouth daily.     albuterol (VENTOLIN HFA) 108 (90 Base) MCG/ACT inhaler Inhale 1-2 puffs into the lungs every 6 (six) hours as needed for wheezing or shortness of breath.     amLODipine (NORVASC) 10 MG tablet TAKE 1 TABLET BY MOUTH ONCE DAILY. 90 tablet 1   aspirin EC 81 MG tablet Take 81 mg by mouth daily. Swallow whole.     esomeprazole (NEXIUM) 20 MG capsule Take 20 mg by mouth daily at 12 noon.     ezetimibe (ZETIA) 10 MG tablet Take 1 tablet (10 mg total) by mouth daily. 90 tablet 3   fluticasone (FLONASE) 50 MCG/ACT nasal spray Place 1 spray into both nostrils daily.     Fluticasone-Umeclidin-Vilant (TRELEGY ELLIPTA) 200-62.5-25 MCG/ACT AEPB Inhale 1  Inhalation into the lungs daily. 1 each 11   KRILL OIL PO Take 400 mg by mouth daily.     meclizine (ANTIVERT) 25 MG tablet Take 1 tablet (25 mg total) by mouth 3 (three) times daily as needed for dizziness. 30 tablet 0   Multiple Vitamin (MULTIVITAMIN) tablet Take 1 tablet by mouth daily.     nitroGLYCERIN (NITROSTAT) 0.4 MG SL tablet Place 1 tablet (0.4 mg total) under the tongue every 5 (five) minutes as needed for chest pain (x3 total. call ems if chest pain does not resolve after one, call ems.). 25 tablet 0   ondansetron (ZOFRAN) 4 MG tablet Take 1 tablet (4 mg total) by mouth every 8 (eight) hours as needed for nausea or vomiting. 20 tablet 0   sertraline (ZOLOFT) 100 MG tablet Take 1 tablet (100 mg total) by mouth daily. 1 tablet 1   furosemide (LASIX) 20 MG tablet Take 1 tablet (20 mg total) by mouth as needed for fluid or edema. (Patient not taking: Reported on 04/24/2022) 90 tablet 0   No current facility-administered medications on file prior to visit.   Past Medical History:  Diagnosis Date   Allergic reaction to bee sting  09/09/2018   Anxiety    Asthma    Bacterial vaginosis 07/07/2019   Bulging lumbar disc 07/22/2015   Cardiomyopathy ([place]) 07/18/2015   Overview:  Overview:  Taka Tsubo 2009, normal coronaries  Last Assessment & Plan:  Relevant Hx: Course: Daily Update: Today's Plan: she has recently been seen by cards and is planned to have echo this Friday  Electronically signed by: Baldemar Friday, [place] 07/24/15 1042   Carpal tunnel syndrome 07/22/2015   Cervical strain 01/11/2019   Chronic fatigue 07/22/2015   Chronic rhinitis 07/22/2015   Depression    Dyslipidemia 07/18/2015   Esophageal stricture 07/22/2015   Essential hypertension    Eustachian tube dysfunction 07/22/2015   Excessive daytime sleepiness 05/16/2019   Gastro-esophageal reflux disease without esophagitis    Gastroesophageal reflux disease without esophagitis 07/18/2015   Last Assessment & Plan:  Relevant Hx: Course:  Daily Update: Today's Plan:appears stable with ppi  Electronically signed by: Baldemar Friday, [place] 07/24/15 1043   High risk medication use 07/22/2015   History of blood clots    Hot flash, menopausal 07/22/2015   Hyperlipidemia 07/22/2015   Last Assessment & Plan:  Relevant Hx: Course: Daily Update: Today's Plan:stable with meds  Electronically signed by: Baldemar Friday, [place] 07/24/15 1112   Hypertension    Localized swelling of both lower legs 07/22/2015   Localized swelling of both lower legs 07/22/2015   Low back strain, initial encounter 07/07/2019   Lung mass    Mixed hyperlipidemia 07/22/2015   Last Assessment & Plan:  Formatting of this note might be different from the original. Relevant Hx: Course: Daily Update: Today's Plan:stable with meds  Electronically signed by: Baldemar Friday, [place] 07/24/15 1112   Morbid obesity ([place]) 07/22/2015   Other insomnia 11/22/2018   Pneumonia due to COVID-19 virus 01/17/2020   Pulmonary embolus ([place]) 01/18/2020   Screening for osteoporosis 02/27/2017   Formatting of this note might be different from the original. 2019: spine 3.1, L femur 1.3   Seasonal allergic rhinitis due to pollen 07/22/2015   Snores 07/22/2015   Last Assessment & Plan:  Relevant Hx: Course: Daily Update: Today's Plan:she was to have psg, but has not had this yet. Discussed that if this is present could be worse post-op with pain meds. None the less, she will have protective airway with/during surgery. She should still have psg study, but wants to wait until after knee repair  Electronically signed by: Baldemar Friday, FNP 07/24/15   Past Surgical History:  Procedure Laterality Date   ABDOMINAL HYSTERECTOMY     CARPAL TUNNEL RELEASE Right    CHOLECYSTECTOMY     ECTOPIC PREGNANCY SURGERY     X 2   FOOT SURGERY     Knee replcement     POLYPECTOMY     TONSILECTOMY/ADENOIDECTOMY WITH MYRINGOTOMY  1972   TOTAL HIP ARTHROPLASTY Right    WISDOM TOOTH EXTRACTION       Family History  Problem Relation Age of Onset   Dementia Mother    Heart attack Father    Heart disease Father    Leukemia Half-Brother    Multiple myeloma Half-Brother    Diabetes Half-Sister    Breast cancer Neg Hx    Social History   Socioeconomic History   Marital status: Divorced    Spouse name: NONE   Number of children: 0   Years of education: 12 +  [employer]   Highest education level: Not on file  Occupational  History   Occupation: DIABLED  Tobacco Use   Smoking status: Former    Types: Cigarettes    Quit date: 06/16/2006    Years since quitting: 15.8   Smokeless tobacco: Never  Vaping Use   Vaping Use: Never used  Substance and Sexual Activity   Alcohol use: Yes    Alcohol/week: 3.0 - 4.0 standard drinks of alcohol    Types: 3 - 4 Cans of beer per week   Drug use: No   Sexual activity: Yes  Other Topics Concern   Not on file  Social History Narrative   Not on file   Social Determinants of Health   Financial Resource Strain: Low Risk  (02/16/2021)   Overall Financial Resource Strain (CARDIA)    Difficulty of Paying Living Expenses: Not hard at all  Food Insecurity: No Food Insecurity (02/16/2021)   Hunger Vital Sign    Worried About Running Out of Food in the Last Year: Never true    [place] in the Last Year: Never true  Transportation Needs: No Transportation Needs (02/16/2021)   PRAPARE - Hydrologist (Medical): No    Lack of Transportation (Non-Medical): No  Physical Activity: Inactive (02/16/2021)   Exercise Vital Sign    Days of Exercise per Week: 0 days    Minutes of Exercise per Session: 0 min  Stress: No Stress Concern Present (02/16/2021)   [place]    Feeling of Stress : Not at all  Social Connections: Socially Isolated (02/16/2021)   Social Connection and Isolation Panel [NHANES]    Frequency of Communication with Friends and Family: More than  three times a week    Frequency of Social Gatherings with Friends and Family: Twice a week    Attends Religious Services: Never    Marine scientist or Organizations: No    Attends Music therapist: Never    Marital Status: Divorced    Review of Systems  Constitutional:  Negative for chills, fatigue and fever.  HENT:  Positive for rhinorrhea and sinus pressure. Negative for congestion and sore throat.   Respiratory:  Positive for cough, shortness of breath and wheezing.   Cardiovascular:  Negative for chest pain.  Gastrointestinal:  Positive for nausea and vomiting. Negative for abdominal pain, constipation and diarrhea.  Genitourinary:  Negative for dysuria and urgency.  Musculoskeletal:  Negative for back pain and myalgias.  Neurological:  Positive for headaches. Negative for dizziness, weakness and light-headedness.  Psychiatric/Behavioral:  Negative for dysphoric mood. The patient is not nervous/anxious.      Objective:  BP 132/76   Pulse 84   Temp (!) 97.4 F (36.3 C)   Resp 16   Ht _0  (1.6 m)   Wt 220 lb (99.8 kg)   BMI 38.97 kg/m      04/24/2022    1:45 PM 04/17/2022   10:49 AM 11/25/2021    9:35 AM  BP/Weight  Systolic BP 226 333 545  Diastolic BP 76 86 72  Wt. (Lbs) 220 219 221  BMI 38.97 kg/m2 38.79 kg/m2 39.15 kg/m2    Physical Exam Vitals reviewed.  Constitutional:      Appearance: Normal appearance. She is obese.  Neck:     Vascular: No carotid bruit.  Cardiovascular:     Rate and Rhythm: Normal rate and regular rhythm.     Heart sounds: Normal heart sounds.  Pulmonary:  Effort: Pulmonary effort is normal. No respiratory distress.     Breath sounds: Normal breath sounds.  Abdominal:     General: Abdomen is flat. Bowel sounds are normal.     Palpations: Abdomen is soft.     Tenderness: There is no abdominal tenderness.  Neurological:     Mental Status: She is alert and oriented to person, place, and time.  Psychiatric:         Mood and Affect: Mood normal.        Behavior: Behavior normal.     Diabetic Foot Exam - Simple   No data filed      Lab Results  Component Value Date   WBC 5.8 10/16/2021   HGB 13.9 10/16/2021   HCT 39.1 10/16/2021   PLT 309 10/16/2021   GLUCOSE 105 (H) 10/16/2021   CHOL 276 (H) 10/31/2021   TRIG 114 10/31/2021   HDL 112 10/31/2021   LDLCALC 145 (H) 10/31/2021   ALT 16 10/16/2021   AST 21 10/16/2021   NA 141 10/16/2021   K 4.8 10/16/2021   CL 102 10/16/2021   CREATININE 0.62 10/16/2021   BUN 11 10/16/2021   CO2 25 10/16/2021   TSH 2.590 10/16/2021   HGBA1C 5.0 11/23/2020      Assessment & Plan:   Problem List Items Addressed This Visit       Cardiovascular and Mediastinum   Essential hypertension    Well controlled.  No changes to medicines. Continue amlodipine 10 mg once daily, aspirin daily.  Continue to work on eating a healthy diet and exercise.  Labs drawn today.        Cardiomyopathy ([place]) - Primary    Taktsubo in 2009. Continue to control hypertension         Respiratory   Asthma     Other   Hyperlipidemia    Well controlled. Continue zetia 10 mg daily. Continue to work on eating a healthy diet and exercise.  Labs drawn today.        Relevant Orders   CBC with Differential/Platelet   Comprehensive metabolic panel   Lipid panel   Class 2 severe obesity due to excess calories with serious comorbidity and body mass index (BMI) of 38.0 to 38.9 in adult [facility])    Recommend continue to work on eating healthy diet and exercise. Comorbidities: hypertension and hyperlipidemia.      GAD (generalized anxiety disorder)    Not at goal Continue zoloft 100 mg daily.  Rx: lorazepam       Relevant Medications   LORazepam (ATIVAN) 0.5 MG tablet   Mild recurrent major depression (HCC)    The current medical regimen is effective;  continue present plan and medications. Continue zoloft 100 mg daily.       Relevant Medications   LORazepam  (ATIVAN) 0.5 MG tablet   Encounter for immunization   Relevant Orders   Flu Vaccine MDCK QUAD PF (Completed)  .  Meds ordered this encounter  Medications   LORazepam (ATIVAN) 0.5 MG tablet    Sig: Take 1 tablet (0.5 mg total) by mouth daily as needed for anxiety (panic attacks).    Dispense:  30 tablet    Refill:  1    Orders Placed This Encounter  Procedures   Flu Vaccine MDCK QUAD PF   CBC with Differential/Platelet   Comprehensive metabolic panel   Lipid panel     Follow-up: Return in about 13 days (around 05/07/2022) for lab visit fasting on  05/07/2022, awv due. Have her come in December. .  An After Visit Summary was printed and given to the patient.  Rochel Brome, MD [facility] [phone]

## 2022-04-26 DIAGNOSIS — Z23 Encounter for immunization: Secondary | ICD-10-CM | POA: Insufficient documentation

## 2022-04-26 NOTE — Assessment & Plan Note (Signed)
Well controlled. Continue zetia 10 mg daily. Continue to work on eating a healthy diet and exercise.  Labs drawn today.

## 2022-04-26 NOTE — Assessment & Plan Note (Addendum)
Well controlled.  No changes to medicines. Continue amlodipine 10 mg once daily, aspirin daily.  Continue to work on eating a healthy diet and exercise.  Labs drawn today.   

## 2022-04-26 NOTE — Assessment & Plan Note (Signed)
The current medical regimen is effective;  continue present plan and medications. Continue zoloft 100 mg daily.

## 2022-04-26 NOTE — Assessment & Plan Note (Signed)
>>  ASSESSMENT AND PLAN FOR ESSENTIAL HYPERTENSION WRITTEN ON 04/26/2022  5:08 PM BY COX, KIRSTEN, MD  Well controlled.  No changes to medicines. Continue amlodipine  10 mg once daily, aspirin daily.  Continue to work on eating a healthy diet and exercise.  Labs drawn today.

## 2022-04-26 NOTE — Assessment & Plan Note (Signed)
Recommend continue to work on eating healthy diet and exercise. Comorbidities: hypertension and hyperlipidemia.  

## 2022-04-26 NOTE — Assessment & Plan Note (Addendum)
Not at goal Continue zoloft 100 mg daily.  Rx: lorazepam

## 2022-04-26 NOTE — Assessment & Plan Note (Signed)
>>  ASSESSMENT AND PLAN FOR HYPERLIPIDEMIA WRITTEN ON 04/26/2022  5:13 PM BY COX, KIRSTEN, MD  Well controlled. Continue zetia 10 mg daily. Continue to work on eating a healthy diet and exercise.  Labs drawn today.

## 2022-04-26 NOTE — Assessment & Plan Note (Signed)
Taktsubo in 2009. Continue to control hypertension

## 2022-05-14 ENCOUNTER — Ambulatory Visit: Payer: Medicare HMO | Admitting: Internal Medicine

## 2022-07-01 ENCOUNTER — Telehealth (INDEPENDENT_AMBULATORY_CARE_PROVIDER_SITE_OTHER): Payer: Medicare HMO | Admitting: Family Medicine

## 2022-07-01 VITALS — Ht 63.0 in | Wt 213.0 lb

## 2022-07-01 DIAGNOSIS — Z0000 Encounter for general adult medical examination without abnormal findings: Secondary | ICD-10-CM | POA: Diagnosis not present

## 2022-07-01 NOTE — Patient Instructions (Addendum)
I really enjoyed getting to talk with you today! I am available on Tuesdays and Thursdays for virtual visits if you have any questions or concerns, or if I can be of any further assistance.   CHECKLIST FROM ANNUAL WELLNESS VISIT:  -Follow up (please call to schedule if not scheduled after visit):   -yearly for annual wellness visit with primary care office  Here is a list of your preventive care/health maintenance measures and the plan for each if any are due:  Health Maintenance  Topic Date Due   DTaP/Tdap/Td (1 - Tdap) Never done   Zoster Vaccines- Shingrix (1 of 2) Never done   PAP SMEAR-Modifier  12/14/2022   Medicare Annual Wellness (AWV)  07/02/2023   COLONOSCOPY (Pts 45-20yrs Insurance coverage will need to be confirmed)  12/21/2023   MAMMOGRAM  02/13/2024   INFLUENZA VACCINE  Completed   Hepatitis C Screening  Completed   HIV Screening  Completed   HPV VACCINES  Aged Out   COVID-19 Vaccine  Discontinued    -See a dentist at least yearly  -Get your eyes checked and then per your eye specialist's recommendations  -Other issues addressed today:   -I have included below further information regarding a healthy whole foods based diet, physical activity guidelines for adults, stress management and opportunities for social connections. I hope you find this information useful.   -----------------------------------------------------------------------------------------------------------------------------------------------------------------------------------------------------------------------------------------------------------  NUTRITION: -eat real food: lots of colorful vegetables (half the plate) and fruits -5-7 servings of vegetables and fruits per day (fresh or steamed is best), exp. 2 servings of vegetables with lunch and dinner and 2 servings of fruit per day. Berries and greens such as kale and collards are great choices.  -consume on a regular basis: whole grains (make  sure first ingredient on label contains the word "whole"), fresh fruits, fish, nuts, seeds, healthy oils (such as olive oil, avocado oil, grape seed oil) -may eat small amounts of dairy and lean meat on occasion, but avoid processed meats such as ham, bacon, lunch meat, etc. -drink water -try to avoid fast food and pre-packaged foods, processed meat -most experts advise limiting sodium to < 2300mg  per day, should limit further is any chronic conditions such as high blood pressure, heart disease, diabetes, etc. The American Heart Association advised that < 1500mg  is is ideal -try to avoid foods that contain any ingredients with names you do not recognize  -try to avoid sugar/sweets (except for the natural sugar that occurs in fresh fruit) -try to avoid sweet drinks -try to avoid white rice, white bread, pasta (unless whole grain), white or yellow potatoes  EXERCISE GUIDELINES FOR ADULTS: -if you wish to increase your physical activity, do so gradually and with the approval of your doctor -STOP and seek medical care immediately if you have any chest pain, chest discomfort or trouble breathing when starting or increasing exercise  -move and stretch your body, legs, feet and arms when sitting for long periods -Physical activity guidelines for optimal health in adults: -least 150 minutes per week of aerobic exercise (can talk, but not sing) once approved by your doctor, 20-30 minutes of sustained activity or two 10 minute episodes of sustained activity every day.  -resistance training at least 2 days per week if approved by your doctor -balance exercises 3+ days per week:   Stand somewhere where you have something sturdy to hold onto if you lose balance.    1) lift up on toes, start with 5x per day and work up  to 20x   2) stand and lift on leg straight out to the side so that foot is a few inches of the floor, start with 5x each side and work up to 20x each side   3) stand on one foot, start with 5  seconds each side and work up to 20 seconds on each side  If you need ideas or help with getting more active:  -Silver sneakers https://tools.silversneakers.com  -Walk with a Doc: http://stephens-thompson.biz/  -try to include resistance (weight lifting/strength building) and balance exercises twice per week: or the following link for ideas: ChessContest.fr  UpdateClothing.com.cy  STRESS MANAGEMENT: -can try meditating, or just sitting quietly with deep breathing while intentionally relaxing all parts of your body for 5 minutes daily -if you need further help with stress, anxiety or depression please follow up with your primary doctor or contact the wonderful folks at [employer]: [place]: -options in [place] if you wish to engage in more social and exercise related activities:  -Silver sneakers https://tools.silversneakers.com  -Walk with a Doc: http://stephens-thompson.biz/  -Check out the [place] 50+ section on the [place] of [employer] (hiking clubs, book clubs, cards and games, chess, exercise classes, aquatic classes and much more) - see the website for details: https://www.[place].gov/departments/parks-recreation/active-adults50  -YouTube has lots of exercise videos for different ages and abilities as well  -[facility] (a variety of indoor and outdoor inperson activities for adults). [phone]. [address]  -Virtual Online Classes (a variety of topics): see seniorplanet.org or call [phone]  -consider volunteering at a school, hospice center, church, senior center or elsewhere         ADVANCED HEALTHCARE DIRECTIVES:  Everyone should have advanced health care directives in place. This is so that you get the care you want, should you ever be in a situation where you are unable to  make your own medical decisions.   From the [place] Advanced Directive Website: "[place] are legal documents in which you give written instructions about your health care if, in the future, you cannot speak for yourself.   A health care power of attorney allows you to name a person you trust to make your health care decisions if you cannot make them yourself. A declaration of a desire for a natural death (or living will) is document, which states that you desire not to have your life prolonged by extraordinary measures if you have a terminal or incurable illness or if you are in a vegetative state. An advance instruction for mental health treatment makes a declaration of instructions, information and preferences regarding your mental health treatment. It also states that you are aware that the advance instruction authorizes a mental health treatment provider to act according to your wishes. It may also outline your consent or refusal of mental health treatment. A declaration of an anatomical gift allows anyone over the age of 34 to make a gift by will, organ donor card or other document."   Please see the following website or an elder law attorney for forms, FAQs and for completion of advanced directives: [place] Secretary of [place] ([email or web address].no)  Or copy and paste the following to your web browser: PokerReunion.[email or web address].cy

## 2022-07-01 NOTE — Progress Notes (Signed)
PATIENT CHECK-IN and HEALTH RISK ASSESSMENT QUESTIONNAIRE:  -completed by phone/video for upcoming Medicare Preventive Visit  Pre-Visit Check-in: 1)Vitals (height, wt, BP, etc) - record in vitals section for visit on day of visit 2)Review and Update Medications, Allergies PMH, Surgeries, Social history in Epic 3)Hospitalizations in the last year with date/reason? no  4)Review and Update Care Team (patient's specialists) in Epic 5) Complete PHQ9 in Epic  6) Complete Fall Screening in Epic 7)Review all Health Maintenance Due and order under PCP if not done.  8)Medicare Wellness Questionnaire: Answer theses question about your habits: Do you drink alcohol? yes If yes, how many drinks do you have a day?1-2 drink once a week Have you ever smoked?Yes  Quit date if applicable? 2017  How many packs a day do/did you smoke? 1 pk a day for a few years (about 4-5 years), had been an on and off smoker Do you use smokeless tobacco?no Do you use an illicit drugs?no Do you exercises? No  Are you sexually active? Yes Number of partners?1 Does her own cooking at home mostly, loves salads and fruits Typical breakfast: scrambled egg with cheese and toast Typical lunch: depends- sandwhich, salad Typical dinner: salad with chicken and ham Typical snacks: fruits  Beverages: water, coffee, diet pepsi, diet dr pepper, tea  Answer theses question about you: Can you perform most household chores?Pt states "yes if her fingers let her." She reports she has trigger finger on R hand, has sports med doctor Do you find it hard to follow a conversation in a noisy room?yes Do you often ask people to speak up or repeat themselves?no Do you feel that you have a problem with memory?yes Do you balance your checkbook and or bank acounts?yes Do you feel safe at home?yes Last dentist visit?today 07/01/2022 with Dr. Concha Se Do you need assistance with any of the following: Please note if so   Driving? no  Feeding yourself?  no  Getting from bed to chair? no  Getting to the toilet? no  Bathing or showering?no  Dressing yourself?no  Managing money?no  Climbing a flight of stairs no  Preparing meals? no  Do you have Advanced Directives in place (Living Will, Healthcare Power or Attorney)? No, but has the paperwork and is on the to do list   Last eye Exam and location?June of 2023 at [facility], [place]   Do you currently use prescribed or non-prescribed narcotic or opioid pain medications?no  Do you have a history or close family history of breast, ovarian, tubal or peritoneal cancer or a family member with BRCA (breast cancer susceptibility 1 and 2) gene mutations? Maternal grandmother had cancer-unable to recall what type of cancer.   Nurse/Assistant Credentials/time stamp: Karpuih M./CMA/4:43pm   ----------------------------------------------------------------------------------------------------------------------------------------------------------------------------------------------------------------------   MEDICARE ANNUAL PREVENTIVE VISIT WITH PROVIDER: (Welcome to Medicare, initial annual wellness or annual wellness exam)  Virtual Visit via Video Note  I connected with [name]  on 07/01/22 by a video enabled telemedicine application and verified that I am speaking with the correct person using two identifiers.  Location patient: home Location provider:work or home office Persons participating in the virtual visit: patient, provider  Concerns and/or follow up today: none, stable. Mentions trigger finger. Reports she has a sports medicine/hand doctor she can see. Discussed sign/smx triger finger and management - she agrees to see her sm specialist about this.    See HM section in Epic for other details of completed HM.    ROS: negative for report of fevers, unintentional weight  loss, vision changes, vision loss, hearing loss or change, chest pain, sob, hemoptysis, melena, hematochezia,  hematuria, genital discharge or lesions, falls, bleeding or bruising, loc, thoughts of suicide or self harm, memory loss  Patient-completed extensive health risk assessment - reviewed and discussed with the patient: See Health Risk Assessment completed with patient prior to the visit either above or in recent phone note. This was reviewed in detailed with the patient today and appropriate recommendations, orders and referrals were placed as needed per Summary below and patient instructions.   Review of Medical History: -PMH, PSH, Family History and current specialty and care providers reviewed and updated and listed below   Patient Care Team: Blane Ohara, MD as PCP - General (Family Medicine) Georgeanna Lea, MD as Consulting Physician (Cardiology)   Past Medical History:  Diagnosis Date   Allergic reaction to bee sting 09/09/2018   Anxiety    Asthma    Bacterial vaginosis 07/07/2019   Bulging lumbar disc 07/22/2015   Cardiomyopathy (HCC) 07/18/2015   Overview:  Overview:  [place] T[place] 2009, normal coronaries  Last Assessment & Plan:  Relevant Hx: Course: Daily Update: Today's Plan: she has recently been seen by cards and is planned to have echo this Friday  Electronically signed by: Jenelle Mages, FNP 07/24/15 1042   Carpal tunnel syndrome 07/22/2015   Cervical strain 01/11/2019   Chronic fatigue 07/22/2015   Chronic rhinitis 07/22/2015   Depression    Dyslipidemia 07/18/2015   Esophageal stricture 07/22/2015   Essential hypertension    Eustachian tube dysfunction 07/22/2015   Excessive daytime sleepiness 05/16/2019   Gastro-esophageal reflux disease without esophagitis    Gastroesophageal reflux disease without esophagitis 07/18/2015   Last Assessment & Plan:  Relevant Hx: Course: Daily Update: Today's Plan:appears stable with ppi  Electronically signed by: Jenelle Mages, FNP 07/24/15 1043   High risk medication use 07/22/2015   History of blood clots    Hot flash, menopausal  07/22/2015   Hyperlipidemia 07/22/2015   Last Assessment & Plan:  Relevant Hx: Course: Daily Update: Today's Plan:stable with meds  Electronically signed by: Jenelle Mages, FNP 07/24/15 1112   Hypertension    Localized swelling of both lower legs 07/22/2015   Localized swelling of both lower legs 07/22/2015   Low back strain, initial encounter 07/07/2019   Lung mass    Mixed hyperlipidemia 07/22/2015   Last Assessment & Plan:  Formatting of this note might be different from the original. Relevant Hx: Course: Daily Update: Today's Plan:stable with meds  Electronically signed by: Jenelle Mages, FNP 07/24/15 1112   Morbid obesity (HCC) 07/22/2015   Other insomnia 11/22/2018   Pneumonia due to COVID-19 virus 01/17/2020   Pulmonary embolus (HCC) 01/18/2020   Screening for osteoporosis 02/27/2017   Formatting of this note might be different from the original. 2019: spine 3.1, L femur 1.3   Seasonal allergic rhinitis due to pollen 07/22/2015   Snores 07/22/2015   Last Assessment & Plan:  Relevant Hx: Course: Daily Update: Today's Plan:she was to have psg, but has not had this yet. Discussed that if this is present could be worse post-op with pain meds. None the less, she will have protective airway with/during surgery. She should still have psg study, but wants to wait until after knee repair  Electronically signed by: Jenelle Mages, FNP 07/24/15    Past Surgical History:  Procedure Laterality Date   ABDOMINAL HYSTERECTOMY     CARPAL TUNNEL RELEASE Right  CHOLECYSTECTOMY     ECTOPIC PREGNANCY SURGERY     X 2   FOOT SURGERY     Knee replcement     POLYPECTOMY     TONSILECTOMY/ADENOIDECTOMY WITH MYRINGOTOMY  1972   TOTAL HIP ARTHROPLASTY Right    WISDOM TOOTH EXTRACTION      Social History   Socioeconomic History   Marital status: Divorced    Spouse name: NONE   Number of children: 0   Years of education: 12 +  SOME [employer]   Highest education level: Not on file   Occupational History   Occupation: DIABLED  Tobacco Use   Smoking status: Former    Types: Cigarettes    Quit date: 06/16/2006    Years since quitting: 16.0   Smokeless tobacco: Never  Vaping Use   Vaping Use: Never used  Substance and Sexual Activity   Alcohol use: Yes    Alcohol/week: 3.0 - 4.0 standard drinks of alcohol    Types: 3 - 4 Cans of beer per week   Drug use: No   Sexual activity: Yes  Other Topics Concern   Not on file  Social History Narrative   Not on file   Social Determinants of Health   Financial Resource Strain: Low Risk  (02/16/2021)   Overall Financial Resource Strain (CARDIA)    Difficulty of Paying Living Expenses: Not hard at all  Food Insecurity: No Food Insecurity (02/16/2021)   Hunger Vital Sign    Worried About Running Out of Food in the Last Year: Never true    Ran Out of Food in the Last Year: Never true  Transportation Needs: No Transportation Needs (02/16/2021)   PRAPARE - Administrator, Civil Service (Medical): No    Lack of Transportation (Non-Medical): No  Physical Activity: Inactive (02/16/2021)   Exercise Vital Sign    Days of Exercise per Week: 0 days    Minutes of Exercise per Session: 0 min  Stress: No Stress Concern Present (02/16/2021)   [employer] of Occupational Health - Occupational Stress Questionnaire    Feeling of Stress : Not at all  Social Connections: Socially Isolated (02/16/2021)   Social Connection and Isolation Panel [NHANES]    Frequency of Communication with Friends and Family: More than three times a week    Frequency of Social Gatherings with Friends and Family: Twice a week    Attends Religious Services: Never    Database administrator or Organizations: No    Attends Banker Meetings: Never    Marital Status: Divorced  Catering manager Violence: Not At Risk (02/16/2021)   Humiliation, Afraid, Rape, and Kick questionnaire    Fear of Current or Ex-Partner: No    Emotionally Abused: No     Physically Abused: No    Sexually Abused: No    Family History  Problem Relation Age of Onset   Dementia Mother    Heart attack Father    Heart disease Father    Leukemia Half-Brother    Multiple myeloma Half-Brother    Diabetes Half-Sister    Breast cancer Neg Hx     Current Outpatient Medications on File Prior to Visit  Medication Sig Dispense Refill   acidophilus (RISAQUAD) CAPS capsule Take 1 capsule by mouth daily.     albuterol (VENTOLIN HFA) 108 (90 Base) MCG/ACT inhaler Inhale 1-2 puffs into the lungs every 6 (six) hours as needed for wheezing or shortness of breath.     amLODipine (NORVASC)  10 MG tablet TAKE 1 TABLET BY MOUTH ONCE DAILY. 90 tablet 1   aspirin EC 81 MG tablet Take 81 mg by mouth daily. Swallow whole.     esomeprazole (NEXIUM) 20 MG capsule Take 20 mg by mouth daily at 12 noon.     fluticasone (FLONASE) 50 MCG/ACT nasal spray Place 1 spray into both nostrils daily.     Fluticasone-Umeclidin-Vilant (TRELEGY ELLIPTA) 200-62.5-25 MCG/ACT AEPB Inhale 1 Inhalation into the lungs daily. 1 each 11   KRILL OIL PO Take 400 mg by mouth daily.     LORazepam (ATIVAN) 0.5 MG tablet Take 1 tablet (0.5 mg total) by mouth daily as needed for anxiety (panic attacks). 30 tablet 1   meclizine (ANTIVERT) 25 MG tablet Take 1 tablet (25 mg total) by mouth 3 (three) times daily as needed for dizziness. 30 tablet 0   Multiple Vitamin (MULTIVITAMIN) tablet Take 1 tablet by mouth daily.     nitroGLYCERIN (NITROSTAT) 0.4 MG SL tablet Place 1 tablet (0.4 mg total) under the tongue every 5 (five) minutes as needed for chest pain (x3 total. call ems if chest pain does not resolve after one, call ems.). 25 tablet 0   ondansetron (ZOFRAN) 4 MG tablet Take 1 tablet (4 mg total) by mouth every 8 (eight) hours as needed for nausea or vomiting. 20 tablet 0   sertraline (ZOLOFT) 100 MG tablet Take 1 tablet (100 mg total) by mouth daily. 1 tablet 1   ezetimibe (ZETIA) 10 MG tablet Take 1 tablet (10  mg total) by mouth daily. 90 tablet 3   furosemide (LASIX) 20 MG tablet Take 1 tablet (20 mg total) by mouth as needed for fluid or edema. (Patient not taking: Reported on 04/24/2022) 90 tablet 0   No current facility-administered medications on file prior to visit.    Allergies  Allergen Reactions   Demerol Anaphylaxis   Meperidine Anaphylaxis   Sulfamethoxazole Itching    With redness to skin    Citalopram Hydrobromide Other (See Comments)    Unknown   Hydrocodone    Hydrocodone-Acetaminophen Other (See Comments)    Unknown   Metoprolol Tartrate Other (See Comments)    Chest discomfort, palpations   Oxycodone-Acetaminophen Other (See Comments)    Unknown   Rosuvastatin Other (See Comments)    myalgias   Diltiazem Rash   Metoprolol Tartrate Palpitations       Physical Exam There were no vitals filed for this visit. Estimated body mass index is 37.73 kg/m as calculated from the following:   Height as of this encounter: 5\' 3"  (1.6 m).   Weight as of this encounter: 213 lb (96.6 kg).  EKG (optional): deferred due to virtual visit  GENERAL: alert, oriented, appears well and in no acute distress; visual acuity grossly intact, full vision exam deferred due to pandemic and/or virtual encounter  HEENT: atraumatic, conjunttiva clear, no obvious abnormalities on inspection of external nose and ears  NECK: normal movements of the head and neck  LUNGS: on inspection no signs of respiratory distress, breathing rate appears normal, no obvious gross SOB, gasping or wheezing  CV: no obvious cyanosis  MS: moves all visible extremities without noticeable abnormality  PSYCH/NEURO: pleasant and cooperative, no obvious depression or anxiety, speech and thought processing grossly intact, Cognitive function grossly intact  Flowsheet Row Video Visit from 07/01/2022 in [facility] [place]  PHQ-9 Total Score 0           07/01/2022    4:25 PM  04/26/2022    5:22 PM  04/24/2022   11:32 PM 10/16/2021    2:47 PM 02/16/2021    8:57 AM  Depression screen PHQ 2/9  Decreased Interest 0 0 1 1 0  Down, Depressed, Hopeless 0 0  2 0  PHQ - 2 Score 0 0 1 3 0  Altered sleeping 0 0  3   Tired, decreased energy 0 0  3   Change in appetite 0 0  2   Feeling bad or failure about yourself  0 0  0   Trouble concentrating 0 0  3   Moving slowly or fidgety/restless 0 0  1   Suicidal thoughts 0 0  0   PHQ-9 Score 0 0  15   Difficult doing work/chores Not difficult at all Not difficult at all  Somewhat difficult        06/07/2020   11:00 AM 02/16/2021    9:07 AM 10/16/2021    2:46 PM 11/25/2021    8:39 AM 07/01/2022    4:25 PM  Fall Risk  Falls in the past year?  1 1 1 1   Was there an injury with Fall?  0 0 0 0  Fall Risk Category Calculator  2 2 2 2   Fall Risk Category (Retired)  Moderate Moderate Moderate   (RETIRED) Patient Fall Risk Level Moderate fall risk Moderate fall risk Moderate fall risk Moderate fall risk   Patient at Risk for Falls Due to  History of fall(s) History of fall(s) History of fall(s) Other (Comment)  Fall risk Follow up  Falls prevention discussed Falls evaluation completed Falls evaluation completed Falls evaluation completed     SUMMARY AND PLAN:  Encounter for Medicare annual wellness exam    Discussed applicable health maintenance/preventive health measures and advised and referred or ordered per patient preferences:  Health Maintenance  Topic Date Due   DTaP/Tdap/Td (1 - Tdap) Never done   Zoster Vaccines- Shingrix (1 of 2) Never done   PAP SMEAR-Modifier  12/14/2022   Medicare Annual Wellness (AWV)  07/02/2023   COLONOSCOPY (Pts 45-5741yrs Insurance coverage will need to be confirmed)  12/21/2023   MAMMOGRAM  02/13/2024   INFLUENZA VACCINE  Completed   Hepatitis C Screening  Completed   HIV Screening  Completed   HPV VACCINES  Aged Out   COVID-19 Vaccine  Discontinued   Education and counseling on the following was provided  based on the above review of health and a plan/checklist for the patient, along with additional information discussed, was provided for the patient in the patient instructions :  -Advised on importance of and resources for completing advanced directives - info provided in handout -Provided counseling and plan for increased risk of falling if applicable per above screening. Discussed safe balance exercises.  -Advised and counseled on maintaining healthy weight and healthy lifestyle - including the importance of a health diet, regular physical activity, social connections and stress management. -Advised and counseled on a whole foods based healthy diet and regular exercise: discussed a heart healthy whole foods based diet at length. A summary of a healthy diet was provided in the Patient Instructions. Recommended regular exercise and summary of guidelines and resources provided in handout. Also discussed chair exercises.   -Advise yearly dental visits at minimum and regular eye exams -Advised and counseled on alcohol safe drinking limits.  Follow up: see patient instructions     Patient Instructions  I really enjoyed getting to talk with you today! I am  available on Tuesdays and Thursdays for virtual visits if you have any questions or concerns, or if I can be of any further assistance.   CHECKLIST FROM ANNUAL WELLNESS VISIT:  -Follow up (please call to schedule if not scheduled after visit):   -yearly for annual wellness visit with primary care office  Here is a list of your preventive care/health maintenance measures and the plan for each if any are due:  Health Maintenance  Topic Date Due   DTaP/Tdap/Td (1 - Tdap) Never done   Zoster Vaccines- Shingrix (1 of 2) Never done   PAP SMEAR-Modifier  12/14/2022   Medicare Annual Wellness (AWV)  07/02/2023   COLONOSCOPY (Pts 45-73yrs Insurance coverage will need to be confirmed)  12/21/2023   MAMMOGRAM  02/13/2024   INFLUENZA VACCINE   Completed   Hepatitis C Screening  Completed   HIV Screening  Completed   HPV VACCINES  Aged Out   COVID-19 Vaccine  Discontinued    -See a dentist at least yearly  -Get your eyes checked and then per your eye specialist's recommendations  -Other issues addressed today:   -I have included below further information regarding a healthy whole foods based diet, physical activity guidelines for adults, stress management and opportunities for social connections. I hope you find this information useful.   -----------------------------------------------------------------------------------------------------------------------------------------------------------------------------------------------------------------------------------------------------------  NUTRITION: -eat real food: lots of colorful vegetables (half the plate) and fruits -5-7 servings of vegetables and fruits per day (fresh or steamed is best), exp. 2 servings of vegetables with lunch and dinner and 2 servings of fruit per day. Berries and greens such as kale and collards are great choices.  -consume on a regular basis: whole grains (make sure first ingredient on label contains the word "whole"), fresh fruits, fish, nuts, seeds, healthy oils (such as olive oil, avocado oil, grape seed oil) -may eat small amounts of dairy and lean meat on occasion, but avoid processed meats such as ham, bacon, lunch meat, etc. -drink water -try to avoid fast food and pre-packaged foods, processed meat -most experts advise limiting sodium to < 2300mg  per day, should limit further is any chronic conditions such as high blood pressure, heart disease, diabetes, etc. The American Heart Association advised that < 1500mg  is is ideal -try to avoid foods that contain any ingredients with names you do not recognize  -try to avoid sugar/sweets (except for the natural sugar that occurs in fresh fruit) -try to avoid sweet drinks -try to avoid white rice, white  bread, pasta (unless whole grain), white or yellow potatoes  EXERCISE GUIDELINES FOR ADULTS: -if you wish to increase your physical activity, do so gradually and with the approval of your doctor -STOP and seek medical care immediately if you have any chest pain, chest discomfort or trouble breathing when starting or increasing exercise  -move and stretch your body, legs, feet and arms when sitting for long periods -Physical activity guidelines for optimal health in adults: -least 150 minutes per week of aerobic exercise (can talk, but not sing) once approved by your doctor, 20-30 minutes of sustained activity or two 10 minute episodes of sustained activity every day.  -resistance training at least 2 days per week if approved by your doctor -balance exercises 3+ days per week:   Stand somewhere where you have something sturdy to hold onto if you lose balance.    1) lift up on toes, start with 5x per day and work up to 20x   2) stand and lift on leg  straight out to the side so that foot is a few inches of the floor, start with 5x each side and work up to 20x each side   3) stand on one foot, start with 5 seconds each side and work up to 20 seconds on each side  If you need ideas or help with getting more active:  -Silver sneakers https://tools.silversneakers.com  -Walk with a Doc: http://stephens-thompson.biz/  -try to include resistance (weight lifting/strength building) and balance exercises twice per week: or the following link for ideas: ChessContest.fr  UpdateClothing.com.cy  STRESS MANAGEMENT: -can try meditating, or just sitting quietly with deep breathing while intentionally relaxing all parts of your body for 5 minutes daily -if you need further help with stress, anxiety or depression please follow up with your primary doctor or contact the wonderful folks at [place]:  [place]: -options in [place] if you wish to engage in more social and exercise related activities:  -Silver sneakers https://tools.silversneakers.com  -Walk with a Doc: http://stephens-thompson.biz/  -Check out the [place] 50+ section on the [place] [employer] (hiking clubs, book clubs, cards and games, chess, exercise classes, aquatic classes and much more) - see the website for details: https://www.[place].gov/departments/parks-recreation/active-adults50  -YouTube has lots of exercise videos for different ages and abilities as well  -[place] (a variety of indoor and outdoor inperson activities for adults). [phone]. [address].  -Virtual Online Classes (a variety of topics): see seniorplanet.org or call [phone]  -consider volunteering at a school, hospice center, church, senior center or elsewhere         ADVANCED HEALTHCARE DIRECTIVES:  Everyone should have advanced health care directives in place. This is so that you get the care you want, should you ever be in a situation where you are unable to make your own medical decisions.   From the [place] Advanced Directive Website: "[place] are legal documents in which you give written instructions about your health care if, in the future, you cannot speak for yourself.   A health care power of attorney allows you to name a person you trust to make your health care decisions if you cannot make them yourself. A declaration of a desire for a natural death (or living will) is document, which states that you desire not to have your life prolonged by extraordinary measures if you have a terminal or incurable illness or if you are in a vegetative state. An advance instruction for mental health treatment makes a declaration of instructions, information and preferences regarding your mental health treatment. It also states that you  are aware that the advance instruction authorizes a mental health treatment provider to act according to your wishes. It may also outline your consent or refusal of mental health treatment. A declaration of an anatomical gift allows anyone over the age of 29 to make a gift by will, organ donor card or other document."   Please see the following website or an elder law attorney for forms, FAQs and for completion of advanced directives: [place] Secretary of [place] ([email or web address])  Or copy and paste the following to your web browser: [email or web address]    Lucretia Kern, DO

## 2022-07-08 DIAGNOSIS — M65341 Trigger finger, right ring finger: Secondary | ICD-10-CM | POA: Diagnosis not present

## 2022-07-10 ENCOUNTER — Other Ambulatory Visit: Payer: Self-pay | Admitting: Family Medicine

## 2022-07-10 DIAGNOSIS — F331 Major depressive disorder, recurrent, moderate: Secondary | ICD-10-CM

## 2022-07-10 DIAGNOSIS — F411 Generalized anxiety disorder: Secondary | ICD-10-CM

## 2022-07-15 ENCOUNTER — Ambulatory Visit: Payer: Medicare HMO | Admitting: Cardiology

## 2022-07-28 ENCOUNTER — Other Ambulatory Visit: Payer: Self-pay

## 2022-07-28 DIAGNOSIS — F3341 Major depressive disorder, recurrent, in partial remission: Secondary | ICD-10-CM | POA: Diagnosis not present

## 2022-07-28 DIAGNOSIS — I499 Cardiac arrhythmia, unspecified: Secondary | ICD-10-CM | POA: Diagnosis not present

## 2022-07-28 DIAGNOSIS — R32 Unspecified urinary incontinence: Secondary | ICD-10-CM | POA: Diagnosis not present

## 2022-07-28 DIAGNOSIS — I10 Essential (primary) hypertension: Secondary | ICD-10-CM | POA: Diagnosis not present

## 2022-07-28 DIAGNOSIS — R69 Illness, unspecified: Secondary | ICD-10-CM | POA: Diagnosis not present

## 2022-07-28 DIAGNOSIS — I252 Old myocardial infarction: Secondary | ICD-10-CM | POA: Diagnosis not present

## 2022-07-28 DIAGNOSIS — K219 Gastro-esophageal reflux disease without esophagitis: Secondary | ICD-10-CM | POA: Diagnosis not present

## 2022-07-28 DIAGNOSIS — I25119 Atherosclerotic heart disease of native coronary artery with unspecified angina pectoris: Secondary | ICD-10-CM | POA: Diagnosis not present

## 2022-07-28 DIAGNOSIS — Z008 Encounter for other general examination: Secondary | ICD-10-CM | POA: Diagnosis not present

## 2022-07-28 DIAGNOSIS — M545 Low back pain, unspecified: Secondary | ICD-10-CM | POA: Diagnosis not present

## 2022-07-28 DIAGNOSIS — E785 Hyperlipidemia, unspecified: Secondary | ICD-10-CM | POA: Diagnosis not present

## 2022-07-28 DIAGNOSIS — M1990 Unspecified osteoarthritis, unspecified site: Secondary | ICD-10-CM | POA: Diagnosis not present

## 2022-07-28 NOTE — Telephone Encounter (Signed)
[name] called with complaints of hand pain and swelling.  She is currently being seen by Dr. Samule Dry but she has not had a arthritis panel drawn.  Appointment scheduled for tomorrow morning.

## 2022-07-29 ENCOUNTER — Ambulatory Visit (INDEPENDENT_AMBULATORY_CARE_PROVIDER_SITE_OTHER): Payer: Medicare HMO | Admitting: Family Medicine

## 2022-07-29 ENCOUNTER — Encounter: Payer: Self-pay | Admitting: Family Medicine

## 2022-07-29 VITALS — BP 110/80 | HR 78 | Temp 97.2°F | Ht 63.0 in | Wt 216.0 lb

## 2022-07-29 DIAGNOSIS — M79641 Pain in right hand: Secondary | ICD-10-CM

## 2022-07-29 DIAGNOSIS — E782 Mixed hyperlipidemia: Secondary | ICD-10-CM

## 2022-07-29 DIAGNOSIS — I10 Essential (primary) hypertension: Secondary | ICD-10-CM | POA: Diagnosis not present

## 2022-07-29 HISTORY — DX: Pain in right hand: M79.641

## 2022-07-29 NOTE — Patient Instructions (Signed)
We will put your lab results in mychart.

## 2022-07-29 NOTE — Assessment & Plan Note (Signed)
>>  ASSESSMENT AND PLAN FOR ESSENTIAL HYPERTENSION WRITTEN ON 07/29/2022  3:57 PM BY COX, KIRSTEN, MD  Well controlled.  No changes to medicines. Continue amlodipine  10 mg once daily, aspirin daily.  Continue to work on eating a healthy diet and exercise.  Labs drawn today.

## 2022-07-29 NOTE — Assessment & Plan Note (Signed)
Well controlled.  No changes to medicines. Continue amlodipine 10 mg once daily, aspirin daily.  Continue to work on eating a healthy diet and exercise.  Labs drawn today.

## 2022-07-29 NOTE — Assessment & Plan Note (Signed)
Check labs. Rule out gout and Rheumatoid arthritis. I do not believe this is very likely.  Likely due to trigger point injection hitting nerve.  Patient to return to orthopedics next week to address further.  Finger is triggering.

## 2022-07-29 NOTE — Assessment & Plan Note (Signed)
Taking zetia.  Return for fasting labs.

## 2022-07-29 NOTE — Progress Notes (Signed)
Acute Office Visit  Subjective:    Patient ID: [name], female    DOB: 1966-06-01, 57 y.o.   MRN: [id number]  Chief Complaint  Patient presents with   Hand Pain    HPI: Patient is in today for right hand pain x 3 months, seen Dr. Samule Dry on 07/08/2022 for trigger finger and had a injection. Since then she feels like she has "fire shooting through her fingers". Currently wearing a compression glove which helps some. Has difficulty closing her fist and holding items (plate, glass, candle) Has some numbness. Joints are swollen. Has been taking aleve with no some relief.  Past Medical History:  Diagnosis Date   Allergic reaction to bee sting 09/09/2018   Anxiety    Asthma    Bacterial vaginosis 07/07/2019   Bulging lumbar disc 07/22/2015   Cardiomyopathy ([place]) 07/18/2015   Overview:  Overview:  [place] 2009, normal coronaries  Last Assessment & Plan:  Relevant Hx: Course: Daily Update: Today's Plan: she has recently been seen by cards and is planned to have echo this Friday  Electronically signed by: Baldemar Friday, [place] 07/24/15 1042   Carpal tunnel syndrome 07/22/2015   Cervical strain 01/11/2019   Chronic fatigue 07/22/2015   Chronic rhinitis 07/22/2015   Depression    Dyslipidemia 07/18/2015   Esophageal stricture 07/22/2015   Essential hypertension    Eustachian tube dysfunction 07/22/2015   Excessive daytime sleepiness 05/16/2019   Gastro-esophageal reflux disease without esophagitis    Gastroesophageal reflux disease without esophagitis 07/18/2015   Last Assessment & Plan:  Relevant Hx: Course: Daily Update: Today's Plan:appears stable with ppi  Electronically signed by: Baldemar Friday, [place] 07/24/15 1043   High risk medication use 07/22/2015   History of blood clots    Hot flash, menopausal 07/22/2015   Hyperlipidemia 07/22/2015   Last Assessment & Plan:  Relevant Hx: Course: Daily Update: Today's Plan:stable with meds  Electronically signed by: Baldemar Friday, [place]  07/24/15 1112   Hypertension    Localized swelling of both lower legs 07/22/2015   Localized swelling of both lower legs 07/22/2015   Low back strain, initial encounter 07/07/2019   Lung mass    Mixed hyperlipidemia 07/22/2015   Last Assessment & Plan:  Formatting of this note might be different from the original. Relevant Hx: Course: Daily Update: Today's Plan:stable with meds  Electronically signed by: Baldemar Friday, [place] 07/24/15 1112   Morbid obesity ([place]) 07/22/2015   Other insomnia 11/22/2018   Pneumonia due to COVID-19 virus 01/17/2020   Pulmonary embolus ([place]) 01/18/2020   Screening for osteoporosis 02/27/2017   Formatting of this note might be different from the original. 2019: spine 3.1, L femur 1.3   Seasonal allergic rhinitis due to pollen 07/22/2015   Snores 07/22/2015   Last Assessment & Plan:  Relevant Hx: Course: Daily Update: Today's Plan:she was to have psg, but has not had this yet. Discussed that if this is present could be worse post-op with pain meds. None the less, she will have protective airway with/during surgery. She should still have psg study, but wants to wait until after knee repair  Electronically signed by: Baldemar Friday, FNP 07/24/15    Past Surgical History:  Procedure Laterality Date   ABDOMINAL HYSTERECTOMY     CARPAL TUNNEL RELEASE Right    CHOLECYSTECTOMY     ECTOPIC PREGNANCY SURGERY     X 2   FOOT SURGERY     Knee replcement  POLYPECTOMY     TONSILECTOMY/ADENOIDECTOMY WITH MYRINGOTOMY  1972   TOTAL HIP ARTHROPLASTY Right    WISDOM TOOTH EXTRACTION      Family History  Problem Relation Age of Onset   Dementia Mother    Heart attack Father    Heart disease Father    Leukemia Half-Brother    Multiple myeloma Half-Brother    Diabetes Half-Sister    Breast cancer Neg Hx     Social History   Socioeconomic History   Marital status: Divorced    Spouse name: NONE   Number of children: 0   Years of education: 50 +  SOME [employer]    Highest education level: Not on file  Occupational History   Occupation: DIABLED  Tobacco Use   Smoking status: Former    Types: Cigarettes    Quit date: 06/16/2006    Years since quitting: 16.1   Smokeless tobacco: Never  Vaping Use   Vaping Use: Never used  Substance and Sexual Activity   Alcohol use: Yes    Alcohol/week: 3.0 - 4.0 standard drinks of alcohol    Types: 3 - 4 Cans of beer per week   Drug use: No   Sexual activity: Yes  Other Topics Concern   Not on file  Social History Narrative   Not on file   Social Determinants of Health   Financial Resource Strain: Low Risk  (02/16/2021)   Overall Financial Resource Strain (CARDIA)    Difficulty of Paying Living Expenses: Not hard at all  Food Insecurity: No Food Insecurity (02/16/2021)   Hunger Vital Sign    Worried About Running Out of Food in the Last Year: Never true    [place] in the Last Year: Never true  Transportation Needs: No Transportation Needs (02/16/2021)   PRAPARE - Hydrologist (Medical): No    Lack of Transportation (Non-Medical): No  Physical Activity: Inactive (02/16/2021)   Exercise Vital Sign    Days of Exercise per Week: 0 days    Minutes of Exercise per Session: 0 min  Stress: No Stress Concern Present (02/16/2021)   [place]    Feeling of Stress : Not at all  Social Connections: Socially Isolated (02/16/2021)   Social Connection and Isolation Panel [NHANES]    Frequency of Communication with Friends and Family: More than three times a week    Frequency of Social Gatherings with Friends and Family: Twice a week    Attends Religious Services: Never    Marine scientist or Organizations: No    Attends Archivist Meetings: Never    Marital Status: Divorced  Human resources officer Violence: Not At Risk (02/16/2021)   Humiliation, Afraid, Rape, and Kick questionnaire    Fear of Current or  Ex-Partner: No    Emotionally Abused: No    Physically Abused: No    Sexually Abused: No    Outpatient Medications Prior to Visit  Medication Sig Dispense Refill   acidophilus (RISAQUAD) CAPS capsule Take 1 capsule by mouth daily.     albuterol (VENTOLIN HFA) 108 (90 Base) MCG/ACT inhaler Inhale 1-2 puffs into the lungs every 6 (six) hours as needed for wheezing or shortness of breath.     amLODipine (NORVASC) 10 MG tablet TAKE 1 TABLET BY MOUTH ONCE DAILY. 90 tablet 1   aspirin EC 81 MG tablet Take 81 mg by mouth daily. Swallow whole.  esomeprazole (NEXIUM) 20 MG capsule Take 20 mg by mouth daily at 12 noon.     ezetimibe (ZETIA) 10 MG tablet Take 1 tablet (10 mg total) by mouth daily. 90 tablet 3   fluticasone (FLONASE) 50 MCG/ACT nasal spray Place 1 spray into both nostrils daily.     Fluticasone-Umeclidin-Vilant (TRELEGY ELLIPTA) 200-62.5-25 MCG/ACT AEPB Inhale 1 Inhalation into the lungs daily. 1 each 11   furosemide (LASIX) 20 MG tablet Take 1 tablet (20 mg total) by mouth as needed for fluid or edema. (Patient not taking: Reported on 04/24/2022) 90 tablet 0   KRILL OIL PO Take 400 mg by mouth daily.     LORazepam (ATIVAN) 0.5 MG tablet Take 1 tablet (0.5 mg total) by mouth daily as needed for anxiety (panic attacks). 30 tablet 1   meclizine (ANTIVERT) 25 MG tablet Take 1 tablet (25 mg total) by mouth 3 (three) times daily as needed for dizziness. 30 tablet 0   Multiple Vitamin (MULTIVITAMIN) tablet Take 1 tablet by mouth daily.     nitroGLYCERIN (NITROSTAT) 0.4 MG SL tablet Place 1 tablet (0.4 mg total) under the tongue every 5 (five) minutes as needed for chest pain (x3 total. call ems if chest pain does not resolve after one, call ems.). 25 tablet 0   ondansetron (ZOFRAN) 4 MG tablet Take 1 tablet (4 mg total) by mouth every 8 (eight) hours as needed for nausea or vomiting. 20 tablet 0   sertraline (ZOLOFT) 100 MG tablet TAKE 1- 1/2 TABLET BY MOUTH ONCE DAILY. 135 tablet 0   No  facility-administered medications prior to visit.    Allergies  Allergen Reactions   Demerol Anaphylaxis   Meperidine Anaphylaxis   Sulfamethoxazole Itching    With redness to skin    Citalopram Hydrobromide Other (See Comments)    Unknown   Hydrocodone    Hydrocodone-Acetaminophen Other (See Comments)    Unknown   Metoprolol Tartrate Other (See Comments)    Chest discomfort, palpations   Oxycodone-Acetaminophen Other (See Comments)    Unknown   Rosuvastatin Other (See Comments)    myalgias   Diltiazem Rash   Metoprolol Tartrate Palpitations    Review of Systems     Objective:        07/29/2022    3:18 PM 07/01/2022    4:22 PM 04/24/2022    1:45 PM  Vitals with BMI  Height 5' 3"$  5' 3"$  5' 3"$   Weight 216 lbs 213 lbs 220 lbs  BMI 38.27 123456 [id number]  Systolic [id number]  [id number]  Diastolic 80  76  Pulse 78  84    No data found.   Physical Exam Vitals reviewed.  Constitutional:      Appearance: Normal appearance. She is obese.  Cardiovascular:     Rate and Rhythm: Normal rate and regular rhythm.     Heart sounds: Normal heart sounds.  Pulmonary:     Effort: Pulmonary effort is normal. No respiratory distress.     Breath sounds: Normal breath sounds.  Musculoskeletal:        General: Tenderness present.     Right wrist: Swelling and tenderness present.       Arms:     Comments: Right hand: Very tender. Mild increase warmth. Mild poserior edema. Left hand: normal.   Neurological:     Mental Status: She is alert and oriented to person, place, and time.  Psychiatric:        Mood and Affect: Mood normal.  Behavior: Behavior normal.     Health Maintenance Due  Topic Date Due   DTaP/Tdap/Td (1 - Tdap) Never done   Zoster Vaccines- Shingrix (1 of 2) Never done    There are no preventive care reminders to display for this patient.   Lab Results  Component Value Date   TSH 2.450 07/31/2022   Lab Results  Component Value Date   WBC 6.8 07/31/2022   HGB  14.5 07/31/2022   HCT 43.0 07/31/2022   MCV 94 07/31/2022   PLT 388 07/31/2022   Lab Results  Component Value Date   NA 135 07/31/2022   K 4.2 07/31/2022   CO2 23 07/31/2022   GLUCOSE 89 07/31/2022   BUN 12 07/31/2022   CREATININE 0.53 (L) 07/31/2022   BILITOT 0.5 07/31/2022   ALKPHOS 121 07/31/2022   AST 18 07/31/2022   ALT 14 07/31/2022   PROT 6.7 07/31/2022   ALBUMIN 4.5 07/31/2022   CALCIUM 9.4 07/31/2022   EGFR 108 07/31/2022   Lab Results  Component Value Date   CHOL 227 (H) 07/31/2022   Lab Results  Component Value Date   HDL 81 07/31/2022   Lab Results  Component Value Date   LDLCALC 127 (H) 07/31/2022   Lab Results  Component Value Date   TRIG 109 07/31/2022   Lab Results  Component Value Date   CHOLHDL 2.8 07/31/2022   Lab Results  Component Value Date   HGBA1C 5.0 11/23/2020       Assessment & Plan:  Essential hypertension Well controlled.  No changes to medicines. Continue amlodipine 10 mg once daily, aspirin daily.  Continue to work on eating a healthy diet and exercise.  Labs drawn today.    Mixed hyperlipidemia Taking zetia.  Return for fasting labs.   Right hand pain Check labs. Rule out gout and Rheumatoid arthritis. I do not believe this is very likely.  Likely due to trigger point injection hitting nerve.  Patient to return to orthopedics next week to address further.  Finger is triggering.     No orders of the defined types were placed in this encounter.    Orders Placed This Encounter  Procedures   Rheumatoid factor   CYCLIC CITRUL PEPTIDE ANTIBODY, IGG/IGA   Sedimentation rate   C-reactive protein   Uric acid   CBC with Differential/Platelet   TSH   Comprehensive metabolic panel   Lipid panel     Follow-up: Return in about 3 months (around 10/27/2022) for Chronic.  An After Visit Summary was printed and given to the patient.  Rochel Brome, MD [facility] [phone]

## 2022-07-31 ENCOUNTER — Other Ambulatory Visit: Payer: Medicare HMO

## 2022-07-31 DIAGNOSIS — M79641 Pain in right hand: Secondary | ICD-10-CM

## 2022-07-31 DIAGNOSIS — I10 Essential (primary) hypertension: Secondary | ICD-10-CM | POA: Diagnosis not present

## 2022-07-31 DIAGNOSIS — E782 Mixed hyperlipidemia: Secondary | ICD-10-CM

## 2022-08-01 LAB — CBC WITH DIFFERENTIAL/PLATELET
Basophils Absolute: 0.1 10*3/uL (ref 0.0–0.2)
Basos: 1 %
EOS (ABSOLUTE): 0.1 10*3/uL (ref 0.0–0.4)
Eos: 1 %
Hematocrit: 43 % (ref 34.0–46.6)
Hemoglobin: 14.5 g/dL (ref 11.1–15.9)
Immature Grans (Abs): 0 10*3/uL (ref 0.0–0.1)
Immature Granulocytes: 0 %
Lymphocytes Absolute: 1.9 10*3/uL (ref 0.7–3.1)
Lymphs: 27 %
MCH: 31.7 pg (ref 26.6–33.0)
MCHC: 33.7 g/dL (ref 31.5–35.7)
MCV: 94 fL (ref 79–97)
Monocytes Absolute: 0.5 10*3/uL (ref 0.1–0.9)
Monocytes: 7 %
Neutrophils Absolute: 4.3 10*3/uL (ref 1.4–7.0)
Neutrophils: 64 %
Platelets: 388 10*3/uL (ref 150–450)
RBC: 4.57 x10E6/uL (ref 3.77–5.28)
RDW: 12.3 % (ref 11.7–15.4)
WBC: 6.8 10*3/uL (ref 3.4–10.8)

## 2022-08-01 LAB — COMPREHENSIVE METABOLIC PANEL
ALT: 14 IU/L (ref 0–32)
AST: 18 IU/L (ref 0–40)
Albumin/Globulin Ratio: 2 (ref 1.2–2.2)
Albumin: 4.5 g/dL (ref 3.8–4.9)
Alkaline Phosphatase: 121 IU/L (ref 44–121)
BUN/Creatinine Ratio: 23 (ref 9–23)
BUN: 12 mg/dL (ref 6–24)
Bilirubin Total: 0.5 mg/dL (ref 0.0–1.2)
CO2: 23 mmol/L (ref 20–29)
Calcium: 9.4 mg/dL (ref 8.7–10.2)
Chloride: 97 mmol/L (ref 96–106)
Creatinine, Ser: 0.53 mg/dL — ABNORMAL LOW (ref 0.57–1.00)
Globulin, Total: 2.2 g/dL (ref 1.5–4.5)
Glucose: 89 mg/dL (ref 70–99)
Potassium: 4.2 mmol/L (ref 3.5–5.2)
Sodium: 135 mmol/L (ref 134–144)
Total Protein: 6.7 g/dL (ref 6.0–8.5)
eGFR: 108 mL/min/{1.73_m2} (ref >59)

## 2022-08-01 LAB — C-REACTIVE PROTEIN: CRP: 4 mg/L (ref 0–10)

## 2022-08-01 LAB — CARDIOVASCULAR RISK ASSESSMENT

## 2022-08-01 LAB — LIPID PANEL
Chol/HDL Ratio: 2.8 ratio (ref 0.0–4.4)
Cholesterol, Total: 227 mg/dL — ABNORMAL HIGH (ref 100–199)
HDL: 81 mg/dL (ref >39)
LDL Chol Calc (NIH): 127 mg/dL — ABNORMAL HIGH (ref 0–99)
Triglycerides: 109 mg/dL (ref 0–149)
VLDL Cholesterol Cal: 19 mg/dL (ref 5–40)

## 2022-08-01 LAB — CYCLIC CITRUL PEPTIDE ANTIBODY, IGG/IGA: Cyclic Citrullin Peptide Ab: 5 units (ref 0–19)

## 2022-08-01 LAB — TSH: TSH: 2.45 u[IU]/mL (ref 0.450–4.500)

## 2022-08-01 LAB — SEDIMENTATION RATE: Sed Rate: 11 mm/hr (ref 0–40)

## 2022-08-01 LAB — URIC ACID: Uric Acid: 5.4 mg/dL (ref 3.0–7.2)

## 2022-08-01 LAB — RHEUMATOID FACTOR: Rheumatoid fact SerPl-aCnc: 10.3 IU/mL (ref <14.0)

## 2022-08-03 NOTE — Progress Notes (Signed)
Liver and kidney function normal. Blood count normal. Arthritis panel normal. Cholesterol elevated. .  Improved since last cholesterol was checked.  Continue Zetia 10 mg daily Recommend keep follow-up with orthopedics.

## 2022-08-05 DIAGNOSIS — M65341 Trigger finger, right ring finger: Secondary | ICD-10-CM | POA: Diagnosis not present

## 2022-09-03 ENCOUNTER — Other Ambulatory Visit: Payer: Self-pay | Admitting: Family Medicine

## 2022-09-03 ENCOUNTER — Encounter: Payer: Self-pay | Admitting: Family Medicine

## 2022-09-03 ENCOUNTER — Ambulatory Visit (INDEPENDENT_AMBULATORY_CARE_PROVIDER_SITE_OTHER): Payer: Medicare HMO | Admitting: Family Medicine

## 2022-09-03 VITALS — BP 110/60 | HR 72 | Temp 97.1°F | Resp 14 | Ht 63.0 in | Wt 218.2 lb

## 2022-09-03 DIAGNOSIS — Z202 Contact with and (suspected) exposure to infections with a predominantly sexual mode of transmission: Secondary | ICD-10-CM | POA: Diagnosis not present

## 2022-09-03 DIAGNOSIS — I10 Essential (primary) hypertension: Secondary | ICD-10-CM

## 2022-09-03 DIAGNOSIS — R1030 Lower abdominal pain, unspecified: Secondary | ICD-10-CM

## 2022-09-03 DIAGNOSIS — R69 Illness, unspecified: Secondary | ICD-10-CM | POA: Diagnosis not present

## 2022-09-03 DIAGNOSIS — J018 Other acute sinusitis: Secondary | ICD-10-CM

## 2022-09-03 DIAGNOSIS — J019 Acute sinusitis, unspecified: Secondary | ICD-10-CM | POA: Insufficient documentation

## 2022-09-03 LAB — POCT URINALYSIS DIP (CLINITEK)
Bilirubin, UA: NEGATIVE
Blood, UA: NEGATIVE
Glucose, UA: NEGATIVE mg/dL
Ketones, POC UA: NEGATIVE mg/dL
Nitrite, UA: NEGATIVE
POC PROTEIN,UA: NEGATIVE
Spec Grav, UA: <=1.005 — AB (ref 1.010–1.025)
Urobilinogen, UA: 0.2 E.U./dL
pH, UA: 6 (ref 5.0–8.0)

## 2022-09-03 MED ORDER — CEFTRIAXONE SODIUM 1 G IJ SOLR
1.0000 g | Freq: Once | INTRAMUSCULAR | Status: AC
  Administered 2022-09-03: 1 g via INTRAMUSCULAR

## 2022-09-03 MED ORDER — AMLODIPINE BESYLATE 10 MG PO TABS: 10.0000 mg | ORAL_TABLET | Freq: Every day | ORAL | 1 refills | Status: DC

## 2022-09-03 MED ORDER — DOXYCYCLINE HYCLATE 100 MG PO TABS: 100.0000 mg | ORAL_TABLET | Freq: Two times a day (BID) | ORAL | 0 refills | Status: DC

## 2022-09-03 NOTE — Progress Notes (Unsigned)
Acute Office Visit  Subjective:    Patient ID: [name], female    DOB: 09/12/1965, 57 y.o.   MRN: [id number]  Chief Complaint  Patient presents with   URI    HPI: Patient is in today for URI which started about two months ago.  She has been doing nasal lavage, allegra and using her inhaler.   Patient is complaining of nasal congestion, sinus headache, and throat irritation.  No earaches.  No chest pain or shortness of breath.  Patient is also concerned because she has had a partner that recently told her that he has hepatitis C.  This was after intercourse. Past Medical History:  Diagnosis Date   Allergic reaction to bee sting 09/09/2018   Anxiety    Asthma    Bacterial vaginosis 07/07/2019   Bulging lumbar disc 07/22/2015   Cardiomyopathy ([place]) 07/18/2015   Overview:  Overview:  Taka Tsubo 2009, normal coronaries  Last Assessment & Plan:  Relevant Hx: Course: Daily Update: Today's Plan: she has recently been seen by cards and is planned to have echo this Friday  Electronically signed by: Baldemar Friday, [place] 07/24/15 1042   Carpal tunnel syndrome 07/22/2015   Cervical strain 01/11/2019   Chronic fatigue 07/22/2015   Chronic rhinitis 07/22/2015   Depression    Dyslipidemia 07/18/2015   Esophageal stricture 07/22/2015   Essential hypertension    Eustachian tube dysfunction 07/22/2015   Excessive daytime sleepiness 05/16/2019   Gastro-esophageal reflux disease without esophagitis    Gastroesophageal reflux disease without esophagitis 07/18/2015   Last Assessment & Plan:  Relevant Hx: Course: Daily Update: Today's Plan:appears stable with ppi  Electronically signed by: Baldemar Friday, [place] 07/24/15 1043   High risk medication use 07/22/2015   History of blood clots    Hot flash, menopausal 07/22/2015   Hyperlipidemia 07/22/2015   Last Assessment & Plan:  Relevant Hx: Course: Daily Update: Today's Plan:stable with meds  Electronically signed by: Baldemar Friday, [place]  07/24/15 1112   Hypertension    Localized swelling of both lower legs 07/22/2015   Localized swelling of both lower legs 07/22/2015   Low back strain, initial encounter 07/07/2019   Lung mass    Mixed hyperlipidemia 07/22/2015   Last Assessment & Plan:  Formatting of this note might be different from the original. Relevant Hx: Course: Daily Update: Today's Plan:stable with meds  Electronically signed by: Baldemar Friday, [place] 07/24/15 1112   Morbid obesity ([place]) 07/22/2015   Other insomnia 11/22/2018   Pneumonia due to COVID-19 virus 01/17/2020   Pulmonary embolus ([place]) 01/18/2020   Screening for osteoporosis 02/27/2017   Formatting of this note might be different from the original. 2019: spine 3.1, L femur 1.3   Seasonal allergic rhinitis due to pollen 07/22/2015   Snores 07/22/2015   Last Assessment & Plan:  Relevant Hx: Course: Daily Update: Today's Plan:she was to have psg, but has not had this yet. Discussed that if this is present could be worse post-op with pain meds. None the less, she will have protective airway with/during surgery. She should still have psg study, but wants to wait until after knee repair  Electronically signed by: Baldemar Friday, FNP 07/24/15    Past Surgical History:  Procedure Laterality Date   ABDOMINAL HYSTERECTOMY     CARPAL TUNNEL RELEASE Right    CHOLECYSTECTOMY     ECTOPIC PREGNANCY SURGERY     X 2   FOOT SURGERY  Knee replcement     POLYPECTOMY     TONSILECTOMY/ADENOIDECTOMY WITH MYRINGOTOMY  1972   TOTAL HIP ARTHROPLASTY Right    WISDOM TOOTH EXTRACTION      Family History  Problem Relation Age of Onset   Dementia Mother    Heart attack Father    Heart disease Father    Leukemia Half-Brother    Multiple myeloma Half-Brother    Diabetes Half-Sister    Breast cancer Neg Hx     Social History   Socioeconomic History   Marital status: Divorced    Spouse name: NONE   Number of children: 0   Years of education: 39 +  SOME [employer]    Highest education level: Not on file  Occupational History   Occupation: DIABLED  Tobacco Use   Smoking status: Former    Types: Cigarettes    Quit date: 06/16/2006    Years since quitting: 16.2   Smokeless tobacco: Never  Vaping Use   Vaping Use: Never used  Substance and Sexual Activity   Alcohol use: Yes    Alcohol/week: 3.0 - 4.0 standard drinks of alcohol    Types: 3 - 4 Cans of beer per week   Drug use: No   Sexual activity: Yes  Other Topics Concern   Not on file  Social History Narrative   Not on file   Social Determinants of Health   Financial Resource Strain: Low Risk  (02/16/2021)   Overall Financial Resource Strain (CARDIA)    Difficulty of Paying Living Expenses: Not hard at all  Food Insecurity: No Food Insecurity (02/16/2021)   Hunger Vital Sign    Worried About Running Out of Food in the Last Year: Never true    [place] in the Last Year: Never true  Transportation Needs: No Transportation Needs (02/16/2021)   PRAPARE - Hydrologist (Medical): No    Lack of Transportation (Non-Medical): No  Physical Activity: Inactive (02/16/2021)   Exercise Vital Sign    Days of Exercise per Week: 0 days    Minutes of Exercise per Session: 0 min  Stress: No Stress Concern Present (02/16/2021)   [place]    Feeling of Stress : Not at all  Social Connections: Socially Isolated (02/16/2021)   Social Connection and Isolation Panel [NHANES]    Frequency of Communication with Friends and Family: More than three times a week    Frequency of Social Gatherings with Friends and Family: Twice a week    Attends Religious Services: Never    Marine scientist or Organizations: No    Attends Archivist Meetings: Never    Marital Status: Divorced  Human resources officer Violence: Not At Risk (02/16/2021)   Humiliation, Afraid, Rape, and Kick questionnaire    Fear of Current or  Ex-Partner: No    Emotionally Abused: No    Physically Abused: No    Sexually Abused: No    Outpatient Medications Prior to Visit  Medication Sig Dispense Refill   acidophilus (RISAQUAD) CAPS capsule Take 1 capsule by mouth daily.     albuterol (VENTOLIN HFA) 108 (90 Base) MCG/ACT inhaler Inhale 1-2 puffs into the lungs every 6 (six) hours as needed for wheezing or shortness of breath.     aspirin EC 81 MG tablet Take 81 mg by mouth daily. Swallow whole.     ezetimibe (ZETIA) 10 MG tablet Take 1 tablet (10 mg  total) by mouth daily. 90 tablet 3   fluticasone (FLONASE) 50 MCG/ACT nasal spray Place 1 spray into both nostrils daily.     Fluticasone-Umeclidin-Vilant (TRELEGY ELLIPTA) 200-62.5-25 MCG/ACT AEPB Inhale 1 Inhalation into the lungs daily. 1 each 11   furosemide (LASIX) 20 MG tablet Take 1 tablet (20 mg total) by mouth as needed for fluid or edema. 90 tablet 0   meclizine (ANTIVERT) 25 MG tablet Take 1 tablet (25 mg total) by mouth 3 (three) times daily as needed for dizziness. 30 tablet 0   Multiple Vitamin (MULTIVITAMIN) tablet Take 1 tablet by mouth daily.     nitroGLYCERIN (NITROSTAT) 0.4 MG SL tablet Place 1 tablet (0.4 mg total) under the tongue every 5 (five) minutes as needed for chest pain (x3 total. call ems if chest pain does not resolve after one, call ems.). 25 tablet 0   ondansetron (ZOFRAN) 4 MG tablet Take 1 tablet (4 mg total) by mouth every 8 (eight) hours as needed for nausea or vomiting. 20 tablet 0   sertraline (ZOLOFT) 100 MG tablet TAKE 1- 1/2 TABLET BY MOUTH ONCE DAILY. 135 tablet 0   amLODipine (NORVASC) 10 MG tablet TAKE 1 TABLET BY MOUTH ONCE DAILY. 90 tablet 1   LORazepam (ATIVAN) 0.5 MG tablet Take 1 tablet (0.5 mg total) by mouth daily as needed for anxiety (panic attacks). 30 tablet 1   esomeprazole (NEXIUM) 20 MG capsule Take 20 mg by mouth daily at 12 noon. (Patient not taking: Reported on 09/03/2022)     KRILL OIL PO Take 400 mg by mouth daily. (Patient  not taking: Reported on 09/03/2022)     No facility-administered medications prior to visit.    Allergies  Allergen Reactions   Demerol Anaphylaxis   Meperidine Anaphylaxis   Sulfamethoxazole Itching    With redness to skin    Citalopram Hydrobromide Other (See Comments)    Unknown   Hydrocodone    Hydrocodone-Acetaminophen Other (See Comments)    Unknown   Metoprolol Tartrate Other (See Comments)    Chest discomfort, palpations   Oxycodone-Acetaminophen Other (See Comments)    Unknown   Rosuvastatin Other (See Comments)    myalgias   Diltiazem Rash   Metoprolol Tartrate Palpitations    Review of Systems  Constitutional:  Positive for diaphoresis and fever. Negative for chills.  HENT:  Positive for congestion and sinus pressure. Negative for ear pain and sore throat.   Respiratory:  Positive for cough.   Gastrointestinal:  Positive for abdominal pain and nausea. Negative for constipation and diarrhea.  Genitourinary:  Negative for dysuria and vaginal discharge.  Psychiatric/Behavioral:  Negative for dysphoric mood. The patient is nervous/anxious (upset about recent situation with her boy friend).        Objective:        09/03/2022    8:57 AM 07/29/2022    3:18 PM 07/01/2022    4:22 PM  Vitals with BMI  Height 5\' 3"  5\' 3"  5\' 3"   Weight 218 lbs 3 oz 216 lbs 213 lbs  BMI 38.66 [id number]  Systolic [id number]   Diastolic 60 80   Pulse 72 78     No data found.   Physical Exam Vitals reviewed.  Constitutional:      Appearance: Normal appearance. She is obese.  HENT:     Right Ear: Tympanic membrane, ear canal and external ear normal.     Left Ear: Tympanic membrane, ear canal and external ear normal.  Nose: Congestion present.     Comments: Sinus tenderness    Mouth/Throat:     Pharynx: Oropharynx is clear.  Cardiovascular:     Rate and Rhythm: Normal rate and regular rhythm.     Heart sounds: Normal heart sounds. No murmur heard. Pulmonary:     Effort:  Pulmonary effort is normal. No respiratory distress.     Breath sounds: Normal breath sounds.  Abdominal:     General: Bowel sounds are normal.     Palpations: Abdomen is soft.     Tenderness: There is no abdominal tenderness. There is no guarding or rebound.  Lymphadenopathy:     Cervical: No cervical adenopathy.  Neurological:     Mental Status: She is alert and oriented to person, place, and time.  Psychiatric:        Mood and Affect: Mood normal.        Behavior: Behavior normal.     Health Maintenance Due  Topic Date Due   DTaP/Tdap/Td (1 - Tdap) Never done   Zoster Vaccines- Shingrix (1 of 2) Never done    There are no preventive care reminders to display for this patient.   Lab Results  Component Value Date   TSH 2.450 07/31/2022   Lab Results  Component Value Date   WBC 6.8 07/31/2022   HGB 14.5 07/31/2022   HCT 43.0 07/31/2022   MCV 94 07/31/2022   PLT 388 07/31/2022   Lab Results  Component Value Date   NA 135 07/31/2022   K 4.2 07/31/2022   CO2 23 07/31/2022   GLUCOSE 89 07/31/2022   BUN 12 07/31/2022   CREATININE 0.53 (L) 07/31/2022   BILITOT 0.5 07/31/2022   ALKPHOS 121 07/31/2022   AST 18 07/31/2022   ALT 14 07/31/2022   PROT 6.7 07/31/2022   ALBUMIN 4.5 07/31/2022   CALCIUM 9.4 07/31/2022   EGFR 108 07/31/2022   Lab Results  Component Value Date   CHOL 227 (H) 07/31/2022   Lab Results  Component Value Date   HDL 81 07/31/2022   Lab Results  Component Value Date   LDLCALC 127 (H) 07/31/2022   Lab Results  Component Value Date   TRIG 109 07/31/2022   Lab Results  Component Value Date   CHOLHDL 2.8 07/31/2022   Lab Results  Component Value Date   HGBA1C 5.0 11/23/2020       Assessment & Plan:  Acute non-recurrent sinusitis of other sinus Assessment & Plan: Sent Doxycycline. Rocephin 1g IM #1  Orders: -     Doxycycline Hyclate; Take 1 tablet (100 mg total) by mouth 2 (two) times daily.  Dispense: 20 tablet; Refill: 0 -      cefTRIAXone Sodium  STD exposure Assessment & Plan: Check labs.  Orders: -     HepB+HepC+HIV Panel -     GC/Chlamydia Probe Amp -     HSV 1 and 2 Ab, IgG -     HIV Antibody (routine testing w rflx) -     cefTRIAXone Sodium  Lower abdominal pain Assessment & Plan: Checked UA.  Orders: -     POCT URINALYSIS DIP (CLINITEK)  Other orders -     amLODIPine Besylate; Take 1 tablet (10 mg total) by mouth daily.  Dispense: 90 tablet; Refill: 1     Meds ordered this encounter  Medications   doxycycline (VIBRA-TABS) 100 MG tablet    Sig: Take 1 tablet (100 mg total) by mouth 2 (two) times daily.  Dispense:  20 tablet    Refill:  0   cefTRIAXone (ROCEPHIN) injection 1 g   amLODipine (NORVASC) 10 MG tablet    Sig: Take 1 tablet (10 mg total) by mouth daily.    Dispense:  90 tablet    Refill:  1    Orders Placed This Encounter  Procedures   GC/Chlamydia Probe Amp   HepB+HepC+HIV Panel   HSV 1 and 2 Ab, IgG   HIV antibody (with reflex)   POCT URINALYSIS DIP (CLINITEK)     Follow-up: Return in about 3 months (around 12/04/2022) for chronic fasting.  An After Visit Summary was printed and given to the patient.   Geralynn Ochs I Leal-Borjas,acting as a scribe for Rochel Brome, MD.,have documented all relevant documentation on the behalf of Rochel Brome, MD,as directed by  Rochel Brome, MD while in the presence of Rochel Brome, MD.    Rochel Brome, MD [place] [phone]

## 2022-09-05 LAB — HEPB+HEPC+HIV PANEL
HIV Screen 4th Generation wRfx: NONREACTIVE
Hep B C IgM: NEGATIVE
Hep B Core Total Ab: NEGATIVE
Hep B E Ab: NEGATIVE
Hep B E Ag: NEGATIVE
Hep B Surface Ab, Qual: NONREACTIVE
Hep C Virus Ab: NONREACTIVE
Hepatitis B Surface Ag: NEGATIVE

## 2022-09-05 LAB — GC/CHLAMYDIA PROBE AMP
Chlamydia trachomatis, NAA: NEGATIVE
Neisseria Gonorrhoeae by PCR: NEGATIVE

## 2022-09-05 LAB — HSV 1 AND 2 AB, IGG
HSV 1 Glycoprotein G Ab, IgG: 11.3 index — ABNORMAL HIGH (ref 0.00–0.90)
HSV 2 IgG, Type Spec: 14.2 index — ABNORMAL HIGH (ref 0.00–0.90)

## 2022-09-06 DIAGNOSIS — R1030 Lower abdominal pain, unspecified: Secondary | ICD-10-CM | POA: Insufficient documentation

## 2022-09-06 NOTE — Assessment & Plan Note (Signed)
Check labs 

## 2022-09-06 NOTE — Assessment & Plan Note (Signed)
Checked UA. 

## 2022-09-06 NOTE — Assessment & Plan Note (Signed)
Sent Doxycycline. Rocephin 1g IM #1

## 2022-09-07 NOTE — Assessment & Plan Note (Signed)
The current medical regimen is effective;  continue present plan and medications. Continue amlodipine 10 mg daily.

## 2022-09-07 NOTE — Assessment & Plan Note (Signed)
>>  ASSESSMENT AND PLAN FOR ESSENTIAL HYPERTENSION WRITTEN ON 09/07/2022  2:58 PM BY COX, KIRSTEN, MD  The current medical regimen is effective;  continue present plan and medications. Continue amlodipine  10 mg daily.

## 2022-09-18 ENCOUNTER — Ambulatory Visit: Payer: Medicare HMO | Admitting: Internal Medicine

## 2022-09-25 ENCOUNTER — Encounter: Payer: Self-pay | Admitting: Cardiology

## 2022-09-25 ENCOUNTER — Ambulatory Visit: Payer: Medicare HMO | Attending: Cardiology | Admitting: Cardiology

## 2022-09-25 VITALS — BP 148/80 | HR 88 | Ht 63.0 in | Wt 215.0 lb

## 2022-09-25 DIAGNOSIS — I10 Essential (primary) hypertension: Secondary | ICD-10-CM

## 2022-09-25 DIAGNOSIS — E782 Mixed hyperlipidemia: Secondary | ICD-10-CM

## 2022-09-25 DIAGNOSIS — I420 Dilated cardiomyopathy: Secondary | ICD-10-CM | POA: Diagnosis not present

## 2022-09-25 NOTE — Addendum Note (Signed)
Addended by: Baldo Ash D on: 09/25/2022 04:13 PM   Modules accepted: Orders

## 2022-09-25 NOTE — Progress Notes (Signed)
Cardiology Office Note:    Date:  09/25/2022   ID:  [name], DOB [birth year], MRN [id number]  PCP:  Blane Ohara, MD  Cardiologist:  Gypsy Balsam, MD    Referring MD: Blane Ohara, MD   Chief Complaint  Patient presents with   Follow-up  Doing fine  History of Present Illness:    [name] is a 57 y.o. female past medical history significant for nonischemic cardiomyopathy with normalization of ventricle ejection fraction, coronary artery disease who has been ruled out with coronary CT angio in 2022 which was normal with calcium score 0 additional problem include essential hypertension dyslipidemia and intolerance to multiple cholesterol medications. Comes today to months for follow-up.  She does have RV and she spent many weeks on the beach in [facility] and she is enjoying it tremendously.  Denies have any chest pain tightness squeezing pressure burning chest no palpitations dizziness swelling of lower extremities  Past Medical History:  Diagnosis Date   Allergic reaction to bee sting 09/09/2018   Anxiety    Asthma    Bacterial vaginosis 07/07/2019   Bulging lumbar disc 07/22/2015   Cardiomyopathy 07/18/2015   Overview:  Overview:  Taka Tsubo 2009, normal coronaries  Last Assessment & Plan:  Relevant Hx: Course: Daily Update: Today's Plan: she has recently been seen by cards and is planned to have echo this Friday  Electronically signed by: Jenelle Mages, FNP 07/24/15 1042   Carpal tunnel syndrome 07/22/2015   Cervical strain 01/11/2019   Chronic fatigue 07/22/2015   Chronic rhinitis 07/22/2015   Depression    Dyslipidemia 07/18/2015   Esophageal stricture 07/22/2015   Essential hypertension    Eustachian tube dysfunction 07/22/2015   Excessive daytime sleepiness 05/16/2019   Gastro-esophageal reflux disease without esophagitis    Gastroesophageal reflux disease without esophagitis 07/18/2015   Last Assessment & Plan:  Relevant Hx: Course: Daily Update: Today's  Plan:appears stable with ppi  Electronically signed by: Jenelle Mages, FNP 07/24/15 1043   High risk medication use 07/22/2015   History of blood clots    Hot flash, menopausal 07/22/2015   Hyperlipidemia 07/22/2015   Last Assessment & Plan:  Relevant Hx: Course: Daily Update: Today's Plan:stable with meds  Electronically signed by: Jenelle Mages, FNP 07/24/15 1112   Hypertension    Localized swelling of both lower legs 07/22/2015   Localized swelling of both lower legs 07/22/2015   Low back strain, initial encounter 07/07/2019   Lung mass    Mixed hyperlipidemia 07/22/2015   Last Assessment & Plan:  Formatting of this note might be different from the original. Relevant Hx: Course: Daily Update: Today's Plan:stable with meds  Electronically signed by: Jenelle Mages, FNP 07/24/15 1112   Morbid obesity 07/22/2015   Other insomnia 11/22/2018   Pneumonia due to COVID-19 virus 01/17/2020   Pulmonary embolus 01/18/2020   Screening for osteoporosis 02/27/2017   Formatting of this note might be different from the original. 2019: spine 3.1, L femur 1.3   Seasonal allergic rhinitis due to pollen 07/22/2015   Snores 07/22/2015   Last Assessment & Plan:  Relevant Hx: Course: Daily Update: Today's Plan:she was to have psg, but has not had this yet. Discussed that if this is present could be worse post-op with pain meds. None the less, she will have protective airway with/during surgery. She should still have psg study, but wants to wait until after knee repair  Electronically signed by: Jenelle Mages, FNP 07/24/15  Past Surgical History:  Procedure Laterality Date   ABDOMINAL HYSTERECTOMY     CARPAL TUNNEL RELEASE Right    CHOLECYSTECTOMY     ECTOPIC PREGNANCY SURGERY     X 2   FOOT SURGERY     Knee replcement     POLYPECTOMY     TONSILECTOMY/ADENOIDECTOMY WITH MYRINGOTOMY  1972   TOTAL HIP ARTHROPLASTY Right    WISDOM TOOTH EXTRACTION      Current Medications: Current Meds   Medication Sig   acidophilus (RISAQUAD) CAPS capsule Take 1 capsule by mouth daily.   albuterol (VENTOLIN HFA) 108 (90 Base) MCG/ACT inhaler Inhale 1-2 puffs into the lungs every 6 (six) hours as needed for wheezing or shortness of breath.   amLODipine (NORVASC) 10 MG tablet Take 1 tablet (10 mg total) by mouth daily.   aspirin EC 81 MG tablet Take 81 mg by mouth daily. Swallow whole.   ezetimibe (ZETIA) 10 MG tablet Take 1 tablet (10 mg total) by mouth daily.   fluticasone (FLONASE) 50 MCG/ACT nasal spray Place 1 spray into both nostrils daily.   Fluticasone-Umeclidin-Vilant (TRELEGY ELLIPTA) 200-62.5-25 MCG/ACT AEPB Inhale 1 Inhalation into the lungs daily.   furosemide (LASIX) 20 MG tablet Take 1 tablet (20 mg total) by mouth as needed for fluid or edema.   KRILL OIL PO Take 400 mg by mouth daily.   meclizine (ANTIVERT) 25 MG tablet Take 1 tablet (25 mg total) by mouth 3 (three) times daily as needed for dizziness.   Multiple Vitamin (MULTIVITAMIN) tablet Take 1 tablet by mouth daily.   nitroGLYCERIN (NITROSTAT) 0.4 MG SL tablet Place 1 tablet (0.4 mg total) under the tongue every 5 (five) minutes as needed for chest pain (x3 total. call ems if chest pain does not resolve after one, call ems.).   ondansetron (ZOFRAN) 4 MG tablet Take 1 tablet (4 mg total) by mouth every 8 (eight) hours as needed for nausea or vomiting.   sertraline (ZOLOFT) 100 MG tablet TAKE 1- 1/2 TABLET BY MOUTH ONCE DAILY.     Allergies:   Demerol, Meperidine, Sulfamethoxazole, Citalopram hydrobromide, Hydrocodone, Hydrocodone-acetaminophen, Metoprolol tartrate, Oxycodone-acetaminophen, Rosuvastatin, Diltiazem, and Metoprolol tartrate   Social History   Socioeconomic History   Marital status: Divorced    Spouse name: NONE   Number of children: 0   Years of education: 12 +  SOME [employer]   Highest education level: Not on file  Occupational History   Occupation: DIABLED  Tobacco Use   Smoking status: Former     Types: Cigarettes    Quit date: 06/16/2006    Years since quitting: 16.2   Smokeless tobacco: Never  Vaping Use   Vaping Use: Never used  Substance and Sexual Activity   Alcohol use: Yes    Alcohol/week: 3.0 - 4.0 standard drinks of alcohol    Types: 3 - 4 Cans of beer per week   Drug use: No   Sexual activity: Yes  Other Topics Concern   Not on file  Social History Narrative   Not on file   Social Determinants of Health   Financial Resource Strain: Low Risk  (02/16/2021)   Overall Financial Resource Strain (CARDIA)    Difficulty of Paying Living Expenses: Not hard at all  Food Insecurity: No Food Insecurity (02/16/2021)   Hunger Vital Sign    Worried About Running Out of Food in the Last Year: Never true    Ran Out of Food in the Last Year: Never true  Transportation Needs:  No Transportation Needs (02/16/2021)   PRAPARE - Administrator, Civil Service (Medical): No    Lack of Transportation (Non-Medical): No  Physical Activity: Inactive (02/16/2021)   Exercise Vital Sign    Days of Exercise per Week: 0 days    Minutes of Exercise per Session: 0 min  Stress: No Stress Concern Present (02/16/2021)   [employer] of Occupational Health - Occupational Stress Questionnaire    Feeling of Stress : Not at all  Social Connections: Socially Isolated (02/16/2021)   Social Connection and Isolation Panel [NHANES]    Frequency of Communication with Friends and Family: More than three times a week    Frequency of Social Gatherings with Friends and Family: Twice a week    Attends Religious Services: Never    Database administrator or Organizations: No    Attends Engineer, structural: Never    Marital Status: Divorced     Family History: The patient's family history includes Dementia in her mother; Diabetes in her half-sister; Heart attack in her father; Heart disease in her father; Leukemia in her half-brother; Multiple myeloma in her half-brother. There is no history of  Breast cancer. ROS:   Please see the history of present illness.    All 14 point review of systems negative except as described per history of present illness  EKGs/Labs/Other Studies Reviewed:      Recent Labs: 07/31/2022: ALT 14; BUN 12; Creatinine, Ser 0.53; Hemoglobin 14.5; Platelets 388; Potassium 4.2; Sodium 135; TSH 2.450  Recent Lipid Panel    Component Value Date/Time   CHOL 227 (H) 07/31/2022 1155   TRIG 109 07/31/2022 1155   HDL 81 07/31/2022 1155   CHOLHDL 2.8 07/31/2022 1155   LDLCALC 127 (H) 07/31/2022 1155    Physical Exam:    VS:  BP (!) 148/80 (BP Location: Right Arm, Patient Position: Sitting, Cuff Size: Normal)   Pulse 88   Ht  (1.6 m)   Wt 215 lb (97.5 kg)   LMP  (LMP Unknown)   SpO2 96%   BMI 38.09 kg/m     Wt Readings from Last 3 Encounters:  09/25/22 215 lb (97.5 kg)  09/03/22 218 lb 3.2 oz (99 kg)  07/29/22 216 lb (98 kg)     GEN:  Well nourished, well developed in no acute distress HEENT: Normal NECK: No JVD; No carotid bruits LYMPHATICS: No lymphadenopathy CARDIAC: RRR, no murmurs, no rubs, no gallops RESPIRATORY:  Clear to auscultation without rales, wheezing or rhonchi  ABDOMEN: Soft, non-tender, non-distended MUSCULOSKELETAL:  No edema; No deformity  SKIN: Warm and dry LOWER EXTREMITIES: no swelling NEUROLOGIC:  Alert and oriented x 3 PSYCHIATRIC:  Normal affect   ASSESSMENT:    1. Dilated cardiomyopathy   2. Essential hypertension   3. Mixed hyperlipidemia    PLAN:    In order of problems listed above:  Dilated cardiomyopathy.  I will repeat echocardiogram to recheck left ventricle ejection fraction. Dyslipidemia with intolerance to multiple medications Medication she takes right now is still Zetia.  Will continue that medication.  I did review K PN which show me her HDL 81 LDL is elevated but HDL is high as well therefore we will continue present management. Essential hypertension blood pressure slightly elevated today  but will do echocardiogram and decide if we need to augment her medical therapy   Medication Adjustments/Labs and Tests Ordered: Current medicines are reviewed at length with the patient today.  Concerns regarding medicines are  outlined above.  No orders of the defined types were placed in this encounter.  Medication changes: No orders of the defined types were placed in this encounter.   Signed, Georgeanna Leaobert J. Corianna Avallone, MD, [facility] 09/25/2022 4:07 PM    [facility]

## 2022-09-25 NOTE — Patient Instructions (Addendum)

## 2022-09-30 ENCOUNTER — Ambulatory Visit: Payer: Medicare HMO | Attending: Cardiology

## 2022-09-30 DIAGNOSIS — I420 Dilated cardiomyopathy: Secondary | ICD-10-CM | POA: Diagnosis not present

## 2022-09-30 LAB — ECHOCARDIOGRAM COMPLETE
Area-P 1/2: 2.88 cm2
S' Lateral: 3.2 cm

## 2022-10-02 ENCOUNTER — Telehealth: Payer: Self-pay

## 2022-10-02 NOTE — Telephone Encounter (Signed)
Left message on My Chart with normal results per Dr. Krasowski's note. Routed to PCP. 

## 2022-10-22 ENCOUNTER — Other Ambulatory Visit: Payer: Self-pay | Admitting: Family Medicine

## 2022-10-22 DIAGNOSIS — F411 Generalized anxiety disorder: Secondary | ICD-10-CM

## 2022-10-22 DIAGNOSIS — F331 Major depressive disorder, recurrent, moderate: Secondary | ICD-10-CM

## 2022-11-03 NOTE — Assessment & Plan Note (Deleted)
Well controlled. Continue zetia 10 mg daily. Continue to work on eating a healthy diet and exercise.  Labs drawn today.   

## 2022-11-03 NOTE — Progress Notes (Deleted)
Subjective:  Patient ID: [name], female    DOB: 1966/01/23  Age: 57 y.o. MRN: [id number]  No chief complaint on file.   HPI   [name] is a 57 year old Caucasian female that presents for follow-up hyperlipidemia, hypertension, GERD, and anxiety with depression.   Lipid/Cholesterol, Follow-up   She was last seen for this 9 months ago.  Management since that visit includes Zetia.     She reports excellent compliance with treatment. She is not having side effects.    Hypertension, follow-up:  BP at that visit was 140/86. Management since that visit includes amlodipine.     She reports excellent compliance with treatment. She is not having side effects.  She is following a Regular diet. She is not exercising. She does not smoke.   Anxiety, Follow-up  She was last seen for anxiety 9 months ago. Changes made at last visit include none.  She reports excellent compliance with treatment. She reports excellent tolerance of treatment. She is not having side effects.          She feels her anxiety is moderate and Worse since last visit.  Depression, Follow-up  She  was last seen for this 9 months ago. Changes made at last visit include sertraline. She reports excellent compliance with treatment. She is not having side effects.          She reports excellent tolerance of treatment. Current symptoms include: difficulty concentrating and fatigue She feels she is Worse since last visit.   GERD, Follow up:  The patient was last seen for GERD 9 months ago. Currently taking Nexium.     She reports excellent compliance with treatment. She is not having side effects. .     She IS experiencing belching. She is NOT experiencing chest pain, cough, or difficulty swallowing     07/29/2022    3:22 PM 07/01/2022    4:25 PM 04/26/2022    5:22 PM 04/24/2022   11:32 PM 10/16/2021    2:47 PM  Depression screen PHQ 2/9  Decreased Interest 0 0 0 1 1  Down, Depressed, Hopeless 0 0 0   2  PHQ - 2 Score 0 0 0 1 3  Altered sleeping 0 0 0  3  Tired, decreased energy 0 0 0  3  Change in appetite 0 0 0  2  Feeling bad or failure about yourself  0 0 0  0  Trouble concentrating 0 0 0  3  Moving slowly or fidgety/restless 0 0 0  1  Suicidal thoughts 0 0 0  0  PHQ-9 Score 0 0 0  15  Difficult doing work/chores Not difficult at all Not difficult at all Not difficult at all  Somewhat difficult        07/01/2022    4:25 PM  Fall Risk   Falls in the past year? 1  Number falls in past yr: 1  Injury with Fall? 0  Risk for fall due to : Other (Comment)  Follow up Falls evaluation completed    Patient Care Team: Blane Ohara, MD as PCP - General (Family Medicine) Georgeanna Lea, MD as Consulting Physician (Cardiology)   Review of Systems  Current Outpatient Medications on File Prior to Visit  Medication Sig Dispense Refill   acidophilus (RISAQUAD) CAPS capsule Take 1 capsule by mouth daily.     albuterol (VENTOLIN HFA) 108 (90 Base) MCG/ACT inhaler Inhale 1-2 puffs into the lungs every 6 (six) hours as needed for  wheezing or shortness of breath.     amLODipine (NORVASC) 10 MG tablet Take 1 tablet (10 mg total) by mouth daily. 90 tablet 1   aspirin EC 81 MG tablet Take 81 mg by mouth daily. Swallow whole.     ezetimibe (ZETIA) 10 MG tablet Take 1 tablet (10 mg total) by mouth daily. 90 tablet 3   fluticasone (FLONASE) 50 MCG/ACT nasal spray Place 1 spray into both nostrils daily.     Fluticasone-Umeclidin-Vilant (TRELEGY ELLIPTA) 200-62.5-25 MCG/ACT AEPB Inhale 1 Inhalation into the lungs daily. 1 each 11   furosemide (LASIX) 20 MG tablet Take 1 tablet (20 mg total) by mouth as needed for fluid or edema. 90 tablet 0   KRILL OIL PO Take 400 mg by mouth daily.     meclizine (ANTIVERT) 25 MG tablet Take 1 tablet (25 mg total) by mouth 3 (three) times daily as needed for dizziness. 30 tablet 0   Multiple Vitamin (MULTIVITAMIN) tablet Take 1 tablet by mouth daily.      nitroGLYCERIN (NITROSTAT) 0.4 MG SL tablet Place 1 tablet (0.4 mg total) under the tongue every 5 (five) minutes as needed for chest pain (x3 total. call ems if chest pain does not resolve after one, call ems.). 25 tablet 0   ondansetron (ZOFRAN) 4 MG tablet Take 1 tablet (4 mg total) by mouth every 8 (eight) hours as needed for nausea or vomiting. 20 tablet 0   sertraline (ZOLOFT) 100 MG tablet TAKE 1 & 1/2 TABLET BY MOUTH ONCE DAILY. 135 tablet 0   No current facility-administered medications on file prior to visit.   Past Medical History:  Diagnosis Date   Allergic reaction to bee sting 09/09/2018   Anxiety    Asthma    Bacterial vaginosis 07/07/2019   Bulging lumbar disc 07/22/2015   Cardiomyopathy (HCC) 07/18/2015   Overview:  Overview:  [place] 2009, normal coronaries  Last Assessment & Plan:  Relevant Hx: Course: Daily Update: Today's Plan: she has recently been seen by cards and is planned to have echo this Friday  Electronically signed by: Jenelle Mages, FNP 07/24/15 1042   Carpal tunnel syndrome 07/22/2015   Cervical strain 01/11/2019   Chronic fatigue 07/22/2015   Chronic rhinitis 07/22/2015   Depression    Dyslipidemia 07/18/2015   Esophageal stricture 07/22/2015   Essential hypertension    Eustachian tube dysfunction 07/22/2015   Excessive daytime sleepiness 05/16/2019   Gastro-esophageal reflux disease without esophagitis    Gastroesophageal reflux disease without esophagitis 07/18/2015   Last Assessment & Plan:  Relevant Hx: Course: Daily Update: Today's Plan:appears stable with ppi  Electronically signed by: Jenelle Mages, FNP 07/24/15 1043   High risk medication use 07/22/2015   History of blood clots    Hot flash, menopausal 07/22/2015   Hyperlipidemia 07/22/2015   Last Assessment & Plan:  Relevant Hx: Course: Daily Update: Today's Plan:stable with meds  Electronically signed by: Jenelle Mages, FNP 07/24/15 1112   Hypertension    Localized swelling of both  lower legs 07/22/2015   Localized swelling of both lower legs 07/22/2015   Low back strain, initial encounter 07/07/2019   Lung mass    Mixed hyperlipidemia 07/22/2015   Last Assessment & Plan:  Formatting of this note might be different from the original. Relevant Hx: Course: Daily Update: Today's Plan:stable with meds  Electronically signed by: Jenelle Mages, FNP 07/24/15 1112   Morbid obesity (HCC) 07/22/2015   Other insomnia 11/22/2018   Pneumonia  due to COVID-19 virus 01/17/2020   Pulmonary embolus (HCC) 01/18/2020   Screening for osteoporosis 02/27/2017   Formatting of this note might be different from the original. 2019: spine 3.1, L femur 1.3   Seasonal allergic rhinitis due to pollen 07/22/2015   Snores 07/22/2015   Last Assessment & Plan:  Relevant Hx: Course: Daily Update: Today's Plan:she was to have psg, but has not had this yet. Discussed that if this is present could be worse post-op with pain meds. None the less, she will have protective airway with/during surgery. She should still have psg study, but wants to wait until after knee repair  Electronically signed by: Jenelle Mages, FNP 07/24/15   Past Surgical History:  Procedure Laterality Date   ABDOMINAL HYSTERECTOMY     CARPAL TUNNEL RELEASE Right    CHOLECYSTECTOMY     ECTOPIC PREGNANCY SURGERY     X 2   FOOT SURGERY     Knee replcement     POLYPECTOMY     TONSILECTOMY/ADENOIDECTOMY WITH MYRINGOTOMY  1972   TOTAL HIP ARTHROPLASTY Right    WISDOM TOOTH EXTRACTION      Family History  Problem Relation Age of Onset   Dementia Mother    Heart attack Father    Heart disease Father    Leukemia Half-Brother    Multiple myeloma Half-Brother    Diabetes Half-Sister    Breast cancer Neg Hx    Social History   Socioeconomic History   Marital status: Divorced    Spouse name: NONE   Number of children: 0   Years of education: 12 +  [employer]   Highest education level: Not on file  Occupational History    Occupation: DIABLED  Tobacco Use   Smoking status: Former    Types: Cigarettes    Quit date: 06/16/2006    Years since quitting: 16.3   Smokeless tobacco: Never  Vaping Use   Vaping Use: Never used  Substance and Sexual Activity   Alcohol use: Yes    Alcohol/week: 3.0 - 4.0 standard drinks of alcohol    Types: 3 - 4 Cans of beer per week   Drug use: No   Sexual activity: Yes  Other Topics Concern   Not on file  Social History Narrative   Not on file   Social Determinants of Health   Financial Resource Strain: Low Risk  (02/16/2021)   Overall Financial Resource Strain (CARDIA)    Difficulty of Paying Living Expenses: Not hard at all  Food Insecurity: No Food Insecurity (02/16/2021)   Hunger Vital Sign    Worried About Running Out of Food in the Last Year: Never true    Ran Out of Food in the Last Year: Never true  Transportation Needs: No Transportation Needs (02/16/2021)   PRAPARE - Administrator, Civil Service (Medical): No    Lack of Transportation (Non-Medical): No  Physical Activity: Inactive (02/16/2021)   Exercise Vital Sign    Days of Exercise per Week: 0 days    Minutes of Exercise per Session: 0 min  Stress: No Stress Concern Present (02/16/2021)   [employer] of Occupational Health - Occupational Stress Questionnaire    Feeling of Stress : Not at all  Social Connections: Socially Isolated (02/16/2021)   Social Connection and Isolation Panel [NHANES]    Frequency of Communication with Friends and Family: More than three times a week    Frequency of Social Gatherings with Friends and Family: Twice a  week    Attends Religious Services: Never    Active Member of Clubs or Organizations: No    Attends Banker Meetings: Never    Marital Status: Divorced    Objective:  LMP  (LMP Unknown)      09/25/2022    3:37 PM 09/03/2022    8:57 AM 07/29/2022    3:18 PM  BP/Weight  Systolic BP 148 110 110  Diastolic BP 80 60 80  Wt. (Lbs) 215 218.2 216   BMI 38.09 kg/m2 38.65 kg/m2 38.26 kg/m2    Physical Exam  Diabetic Foot Exam - Simple   No data filed      Lab Results  Component Value Date   WBC 6.8 07/31/2022   HGB 14.5 07/31/2022   HCT 43.0 07/31/2022   PLT 388 07/31/2022   GLUCOSE 89 07/31/2022   CHOL 227 (H) 07/31/2022   TRIG 109 07/31/2022   HDL 81 07/31/2022   LDLCALC 127 (H) 07/31/2022   ALT 14 07/31/2022   AST 18 07/31/2022   NA 135 07/31/2022   K 4.2 07/31/2022   CL 97 07/31/2022   CREATININE 0.53 (L) 07/31/2022   BUN 12 07/31/2022   CO2 23 07/31/2022   TSH 2.450 07/31/2022   HGBA1C 5.0 11/23/2020      Assessment & Plan:    Mixed hyperlipidemia Assessment & Plan: Well controlled. Continue zetia 10 mg daily. Continue to work on eating a healthy diet and exercise.  Labs drawn today.     Essential hypertension  GAD (generalized anxiety disorder)     No orders of the defined types were placed in this encounter.   No orders of the defined types were placed in this encounter.    Follow-up: No follow-ups on file.   I,Azahel Belcastro I Leal-Borjas,acting as a scribe for Blane Ohara, MD.,have documented all relevant documentation on the behalf of Blane Ohara, MD,as directed by  Blane Ohara, MD while in the presence of Blane Ohara, MD.   An After Visit Summary was printed and given to the patient.  Blane Ohara, MD [facility] [phone]

## 2022-11-04 ENCOUNTER — Ambulatory Visit: Payer: Medicare HMO | Admitting: Family Medicine

## 2022-11-04 DIAGNOSIS — E782 Mixed hyperlipidemia: Secondary | ICD-10-CM

## 2022-11-04 DIAGNOSIS — F411 Generalized anxiety disorder: Secondary | ICD-10-CM

## 2022-11-04 DIAGNOSIS — I10 Essential (primary) hypertension: Secondary | ICD-10-CM

## 2022-11-27 ENCOUNTER — Other Ambulatory Visit: Payer: Self-pay

## 2022-11-27 DIAGNOSIS — F331 Major depressive disorder, recurrent, moderate: Secondary | ICD-10-CM

## 2022-11-27 DIAGNOSIS — F411 Generalized anxiety disorder: Secondary | ICD-10-CM

## 2022-11-27 MED ORDER — SERTRALINE HCL 100 MG PO TABS
ORAL_TABLET | ORAL | 0 refills | Status: DC
Start: 2022-11-27 — End: 2023-06-21

## 2022-11-27 MED ORDER — EZETIMIBE 10 MG PO TABS: 10.0000 mg | ORAL_TABLET | Freq: Every day | ORAL | 1 refills | Status: DC

## 2022-12-15 ENCOUNTER — Emergency Department (HOSPITAL_BASED_OUTPATIENT_CLINIC_OR_DEPARTMENT_OTHER)
Admission: EM | Admit: 2022-12-15 | Discharge: 2022-12-15 | Disposition: A | Discharge status: Home / Self Care | Payer: Medicare HMO | Attending: Emergency Medicine | Admitting: Emergency Medicine

## 2022-12-15 ENCOUNTER — Encounter (HOSPITAL_BASED_OUTPATIENT_CLINIC_OR_DEPARTMENT_OTHER): Payer: Self-pay | Admitting: Urology

## 2022-12-15 ENCOUNTER — Emergency Department (HOSPITAL_BASED_OUTPATIENT_CLINIC_OR_DEPARTMENT_OTHER): Payer: Medicare HMO

## 2022-12-15 DIAGNOSIS — E876 Hypokalemia: Secondary | ICD-10-CM | POA: Diagnosis not present

## 2022-12-15 DIAGNOSIS — I10 Essential (primary) hypertension: Secondary | ICD-10-CM | POA: Diagnosis not present

## 2022-12-15 DIAGNOSIS — M545 Low back pain, unspecified: Secondary | ICD-10-CM | POA: Diagnosis not present

## 2022-12-15 DIAGNOSIS — J45909 Unspecified asthma, uncomplicated: Secondary | ICD-10-CM | POA: Insufficient documentation

## 2022-12-15 DIAGNOSIS — Z7982 Long term (current) use of aspirin: Secondary | ICD-10-CM | POA: Diagnosis not present

## 2022-12-15 DIAGNOSIS — Z7951 Long term (current) use of inhaled steroids: Secondary | ICD-10-CM | POA: Insufficient documentation

## 2022-12-15 DIAGNOSIS — K5730 Diverticulosis of large intestine without perforation or abscess without bleeding: Secondary | ICD-10-CM | POA: Diagnosis not present

## 2022-12-15 DIAGNOSIS — Z79899 Other long term (current) drug therapy: Secondary | ICD-10-CM | POA: Diagnosis not present

## 2022-12-15 DIAGNOSIS — R109 Unspecified abdominal pain: Secondary | ICD-10-CM | POA: Diagnosis not present

## 2022-12-15 LAB — CBC
HCT: 42.4 % (ref 36.0–46.0)
Hemoglobin: 14.5 g/dL (ref 12.0–15.0)
MCH: 32.2 pg (ref 26.0–34.0)
MCHC: 34.2 g/dL (ref 30.0–36.0)
MCV: 94.2 fL (ref 80.0–100.0)
Platelets: 365 10*3/uL (ref 150–400)
RBC: 4.5 MIL/uL (ref 3.87–5.11)
RDW: 12.7 % (ref 11.5–15.5)
WBC: 9.1 10*3/uL (ref 4.0–10.5)
nRBC: 0 % (ref 0.0–0.2)

## 2022-12-15 LAB — URINALYSIS, ROUTINE W REFLEX MICROSCOPIC
Bilirubin Urine: NEGATIVE
Glucose, UA: NEGATIVE mg/dL
Hgb urine dipstick: NEGATIVE
Ketones, ur: NEGATIVE mg/dL
Leukocytes,Ua: NEGATIVE
Nitrite: NEGATIVE
Protein, ur: NEGATIVE mg/dL
Specific Gravity, Urine: >=1.03 (ref 1.005–1.030)
pH: 5.5 (ref 5.0–8.0)

## 2022-12-15 LAB — BASIC METABOLIC PANEL
Anion gap: 10 (ref 5–15)
BUN: 11 mg/dL (ref 6–20)
CO2: 27 mmol/L (ref 22–32)
Calcium: 8.9 mg/dL (ref 8.9–10.3)
Chloride: 98 mmol/L (ref 98–111)
Creatinine, Ser: 0.7 mg/dL (ref 0.44–1.00)
GFR, Estimated: >60 mL/min (ref >60)
Glucose, Bld: 135 mg/dL — ABNORMAL HIGH (ref 70–99)
Potassium: 3.2 mmol/L — ABNORMAL LOW (ref 3.5–5.1)
Sodium: 135 mmol/L (ref 135–145)

## 2022-12-15 MED ORDER — IBUPROFEN 600 MG PO TABS: 600.0000 mg | ORAL_TABLET | Freq: Four times a day (QID) | ORAL | 0 refills | Status: AC | PRN

## 2022-12-15 MED ORDER — TIZANIDINE HCL 4 MG PO TABS: 4.0000 mg | ORAL_TABLET | Freq: Three times a day (TID) | ORAL | 0 refills | Status: DC | PRN

## 2022-12-15 MED ORDER — LIDOCAINE 5 % EX PTCH
1.0000 | MEDICATED_PATCH | CUTANEOUS | Status: DC
  Administered 2022-12-15: 1 via TRANSDERMAL
  Filled 2022-12-15: qty 1

## 2022-12-15 MED ORDER — KETOROLAC TROMETHAMINE 15 MG/ML IJ SOLN
15.0000 mg | Freq: Once | INTRAMUSCULAR | Status: AC
  Administered 2022-12-15: 15 mg via INTRAMUSCULAR
  Filled 2022-12-15: qty 1

## 2022-12-15 MED ORDER — POTASSIUM CHLORIDE CRYS ER 20 MEQ PO TBCR
40.0000 meq | EXTENDED_RELEASE_TABLET | Freq: Once | ORAL | Status: AC
  Administered 2022-12-15: 40 meq via ORAL
  Filled 2022-12-15: qty 2

## 2022-12-15 NOTE — Discharge Instructions (Addendum)
You were seen in the ER today for evaluation of your right lower back pain.  Is likely from a muscle spasm.  I would like for you to take 1000 mg of Tylenol and/or 600 mg of ibuprofen every 6 hours as needed for pain.  I would also like for you to trial over-the-counter lidocaine patches applied to the area.  Please do not use ice or heat to the area if a lidocaine patch to avoid any burns.  Additionally, prescribing a muscle relaxer that you can take every 8 hours as needed for pain.  Please do not drive or operate heavy machinery while on this medication as it will make you sleepy.  I would like for you to follow-up with your primary care doctor for further evaluation.  If you have any concerns, new or worsening symptoms, please return to the nearest emergency department for evaluation.  Additionally, like for you to follow-up with your primary care doctor next few days for reevaluation of your low potassium for re-check.  Contact a health care provider if: You have pain that is not relieved with rest or medicine. You have increasing pain going down into your legs or buttocks. Your pain does not improve after 2 weeks. You have pain at night. You lose weight without trying. You have a fever or chills. You develop nausea or vomiting. You develop abdominal pain. Get help right away if: You develop new bowel or bladder control problems. You have unusual weakness or numbness in your arms or legs. You feel faint. These symptoms may represent a serious problem that is an emergency. Do not wait to see if the symptoms will go away. Get medical help right away. Call your local emergency services (911 in the [place] Do not drive yourself to the hospital.

## 2022-12-15 NOTE — ED Triage Notes (Signed)
Pt states right side flank pain since last week  Concern for kidney stone  No obvious blood in her urine   H/o kidney stones

## 2022-12-15 NOTE — ED Provider Notes (Cosign Needed Addendum)
[place] [facility] Provider Note   CSN: [id number] Arrival date & time: 12/15/22  1216     History Chief Complaint  Patient presents with   Flank Pain    [name] is a 57 y.o. female with history of hypertension, asthma, kidney stones presents emerged from today for evaluation of right-sided lower back pain for the past week.  She reports that she was doing some yard work a week ago and started develop some right-sided lower back pain.  Worse with laying down or sitting up driving.  Denies any radiation of the pain.  She denies any trauma or blunt trauma to the area.  She denies any fecal/urinary incontinence, fevers, IV drug use, or any saddle anesthesia.  Denies any history of cancer.  She has not tried any medication for this.  Denies any chest pain, shortness breath, dysuria, hematuria, abdominal pain, nausea, vomiting, diarrhea, constipation, melena, hematochezia.  She denies any numbness or tingling down her legs.  Denies any weakness in her lower legs.  She reports that she feels like it is a kidney stone.  Denies any tobacco, EtOH, also drug use.   Flank Pain Pertinent negatives include no chest pain and no shortness of breath.       Home Medications Prior to Admission medications   Medication Sig Start Date End Date Taking? Authorizing Provider  ibuprofen (ADVIL) 600 MG tablet Take 1 tablet (600 mg total) by mouth every 6 (six) hours as needed. 12/15/22  Yes Achille Rich, PA-C  tiZANidine (ZANAFLEX) 4 MG tablet Take 1 tablet (4 mg total) by mouth every 8 (eight) hours as needed for muscle spasms. 12/15/22  Yes Achille Rich, PA-C  acidophilus (RISAQUAD) CAPS capsule Take 1 capsule by mouth daily.    [provider]  albuterol (VENTOLIN HFA) 108 (90 Base) MCG/ACT inhaler Inhale 1-2 puffs into the lungs every 6 (six) hours as needed for wheezing or shortness of breath.    [provider]  amLODipine (NORVASC) 10 MG tablet  Take 1 tablet (10 mg total) by mouth daily. 09/03/22   Blane Ohara, MD  aspirin EC 81 MG tablet Take 81 mg by mouth daily. Swallow whole.    [provider]  ezetimibe (ZETIA) 10 MG tablet Take 1 tablet (10 mg total) by mouth daily. 11/27/22 02/25/23  CoxFritzi Mandes, MD  fluticasone (FLONASE) 50 MCG/ACT nasal spray Place 1 spray into both nostrils daily. 03/13/16   [provider]  Fluticasone-Umeclidin-Vilant (TRELEGY ELLIPTA) 200-62.5-25 MCG/ACT AEPB Inhale 1 Inhalation into the lungs daily. 04/17/22   Janie Morning, NP  furosemide (LASIX) 20 MG tablet Take 1 tablet (20 mg total) by mouth as needed for fluid or edema. 11/25/21   Cox, Kirsten, MD  KRILL OIL PO Take 400 mg by mouth daily.    [provider]  meclizine (ANTIVERT) 25 MG tablet Take 1 tablet (25 mg total) by mouth 3 (three) times daily as needed for dizziness. 11/25/21   CoxFritzi Mandes, MD  Multiple Vitamin (MULTIVITAMIN) tablet Take 1 tablet by mouth daily.    [provider]  nitroGLYCERIN (NITROSTAT) 0.4 MG SL tablet Place 1 tablet (0.4 mg total) under the tongue every 5 (five) minutes as needed for chest pain (x3 total. call ems if chest pain does not resolve after one, call ems.). 10/16/21   Georgeanna Lea, MD  ondansetron (ZOFRAN) 4 MG tablet Take 1 tablet (4 mg total) by mouth every 8 (eight) hours as  needed for nausea or vomiting. 11/02/20   Cox, Fritzi Mandes, MD  sertraline (ZOLOFT) 100 MG tablet TAKE 1 & 1/2 TABLET BY MOUTH ONCE DAILY. 11/27/22   CoxFritzi Mandes, MD      Allergies    Demerol, Meperidine, Sulfamethoxazole, Citalopram hydrobromide, Hydrocodone, Hydrocodone-acetaminophen, Metoprolol tartrate, Oxycodone-acetaminophen, Rosuvastatin, Diltiazem, and Metoprolol tartrate    Review of Systems   Review of Systems  Constitutional:  Negative for chills and fever.  Respiratory:  Negative for shortness of breath.   Cardiovascular:  Negative for chest pain.  Gastrointestinal:  Negative for  abdominal distention, constipation, diarrhea, nausea and vomiting.       Denies any fecal incontinence  Genitourinary:  Negative for dysuria, flank pain and hematuria.       Denies any urinary incontinence or urinary retention.  Musculoskeletal:  Positive for back pain.       Denies any saddle anesthesia  Neurological:  Negative for weakness and numbness.    Physical Exam Updated Vital Signs BP (!) 145/92 (BP Location: Left Arm)   Pulse 96   Temp 98.1 F (36.7 C) (Oral)   Resp 16   Ht 5\' 3"  (1.6 m)   Wt 97.5 kg   LMP  (LMP Unknown)   SpO2 98%   BMI 38.08 kg/m  Physical Exam Vitals and nursing note reviewed.  Constitutional:      General: She is not in acute distress.    Appearance: She is not toxic-appearing.  HENT:     Mouth/Throat:     Mouth: Mucous membranes are moist.  Eyes:     General: No scleral icterus. Pulmonary:     Effort: Pulmonary effort is normal. No respiratory distress.  Abdominal:     General: Bowel sounds are normal.     Palpations: Abdomen is soft.     Tenderness: There is no abdominal tenderness. There is no right CVA tenderness, left CVA tenderness, guarding or rebound.  Musculoskeletal:     Cervical back: Normal range of motion.       Back:     Comments: Tenderness to the marked above area.  I do not appreciate any overlying skin changes, erythema, ecchymosis, rash, or overlying signs of trauma.  No step-offs or deformities to the area.  There is palpable muscle spasm present however.  No fluctuance or induration.  No midline cervical, thoracic, or lumbar tenderness palpation.  She has strength is 5-5 in her lower extremities.  Sensation intact and symmetric per patient.  She is palpable DP and PT pulses.  Compartments are soft.  I do not appreciate any lower leg swelling.  Skin:    General: Skin is warm and dry.  Neurological:     General: No focal deficit present.     Mental Status: She is alert. Mental status is at baseline.     ED Results /  Procedures / Treatments   Labs (all labs ordered are listed, but only abnormal results are displayed) Labs Reviewed  BASIC METABOLIC PANEL - Abnormal; Notable for the following components:      Result Value   Potassium 3.2 (*)    Glucose, Bld 135 (*)    All other components within normal limits  URINALYSIS, ROUTINE W REFLEX MICROSCOPIC  CBC    EKG None  Radiology CT Renal Stone Study  Result Date: 12/15/2022 CLINICAL DATA:  Right flank pain for 1 week. EXAM: CT ABDOMEN AND PELVIS WITHOUT CONTRAST TECHNIQUE: Multidetector CT imaging of the abdomen and pelvis was performed following the standard  protocol without IV contrast. RADIATION DOSE REDUCTION: This exam was performed according to the departmental dose-optimization program which includes automated exposure control, adjustment of the mA and/or kV according to patient size and/or use of iterative reconstruction technique. COMPARISON:  CT abdomen/pelvis 02/12/2008 FINDINGS: Lower chest: The lung bases are clear. Mild mitral annular calcifications are noted. The imaged heart is otherwise unremarkable. Hepatobiliary: The liver is unremarkable. The gallbladder is surgically absent. There is no biliary ductal dilatation. Pancreas: Unremarkable. Spleen: Unremarkable. Adrenals/Urinary Tract: The adrenals are unremarkable. The kidneys are unremarkable, with no focal lesion, stone, hydronephrosis, or hydroureter. The bladder is unremarkable. Stomach/Bowel: The stomach is unremarkable. There is no evidence of bowel obstruction. There are scattered colonic diverticula without evidence of acute diverticulitis. The appendix is normal. Vascular/Lymphatic: There is minimal calcified plaque in the nonaneurysmal abdominal aorta. There is no abdominal or pelvic lymphadenopathy. Reproductive: The uterus is surgically absent. There is no adnexal mass. Other: There is no ascites or free air. Musculoskeletal: There is no acute osseous abnormality or suspicious osseous  lesion. Postsurgical changes reflecting right hip arthroplasty are noted. IMPRESSION: 1. No acute finding in the abdomen or pelvis to explain the patient's symptoms. 2. Scattered colonic diverticula without evidence of acute diverticulitis. Electronically Signed   By: Lesia Hausen M.D.   On: 12/15/2022 13:53    Procedures Procedures   Medications Ordered in ED Medications  lidocaine (LIDODERM) 5 % 1 patch (1 patch Transdermal Patch Applied 12/15/22 1427)  potassium chloride SA (KLOR-CON M) CR tablet 40 mEq (40 mEq Oral Given 12/15/22 1427)  ketorolac (TORADOL) 15 MG/ML injection 15 mg (15 mg Intramuscular Given 12/15/22 1427)    ED Course/ Medical Decision Making/ A&P   Medical Decision Making Amount and/or Complexity of Data Reviewed Labs: ordered. Radiology: ordered.  Risk Prescription drug management.  57 y.o. female presents to the ER for evaluation of lower back pain. Differential diagnosis includes but is not limited to Fracture (acute/chronic), muscle strain, cauda equina, spinal stenosis, DDD, ligamentous injury, disk herniation, metastatic cancer, vertebral osteomyelitis, kidney stone, pyelonephritis, AAA, pancreatitis, bowel obstruction, meningitis. Vital signs slightly elevated BP, otherwise unremarkable. Physical exam as noted above.   I independently reviewed and interpreted the patient's labs.  Urinalysis shows little concentration otherwise unremarkable.  CBC unremarkable.  BMP does show mildly decreased potassium 3.2 and mildly elevated glucose at 135 otherwise no electrolyte abnormality.  CT renal stone study shows  1. No acute finding in the abdomen or pelvis to explain the patient's symptoms. 2. Scattered colonic diverticula without evidence of acute diverticulitis.  CT renal stone study was ordered in triage.  This does not sound like any kidney stone pain given the tenderness on palpation to the lower right paraspinal lumbar area.  CT scan does not show any signs of kidney  stone.  Her lab work does show mildly low potassium which patient reports that she knows about needs to start taking her potassium supplement again.  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  She does not have any fever, history of IV drug use, history of cancer, saddle anesthesia, or fecal/urinary incontinence.  I doubt any cauda equina.  She has good strength and sensation in her bilateral lower extremities.  I doubt any meningitis, bowel obstruction, or pancreatitis.  This does not sound consistent with a AAA.  She has no CVA tenderness so doubt any pyelonephritis as well as her urine is unremarkable  with no urinary symptoms.  Given the paraspinal tenderness, this is likely a muscular strain.  She does not have any midline tenderness.  I discussed with patient the lab and imaging results.  Will order some potassium while she is here and a shot of Toradol.  Lidocaine patch ordered as well.  Overall, I think this is likely muscular spasm and can be treated outpatient with muscle relaxers, lidocaine patches, and Tylenol ibuprofen.  Patient is amenable to this plan. She is ambulatory.   We discussed the results of the labs/imaging. The plan is to take the medication as needed, selected as needed, gentle stretching, lidocaine patches, follow-up with PCP regarding low potassium. We discussed strict return precautions and red flag symptoms. The patient verbalized their understanding and agrees to the plan. The patient is stable and being discharged home in good condition.  Portions of this report may have been transcribed using voice recognition software. Every effort was made to ensure accuracy; however, inadvertent computerized transcription errors may be present.   Final Clinical Impression(s) / ED Diagnoses Final diagnoses:  Acute right-sided low back pain without sciatica  Hypokalemia    Rx / DC Orders ED Discharge Orders           Ordered    tiZANidine (ZANAFLEX) 4 MG tablet  Every 8 hours PRN        12/15/22 1415    ibuprofen (ADVIL) 600 MG tablet  Every 6 hours PRN        12/15/22 1415              Achille Rich, PA-C 12/15/22 1816    Achille Rich, PA-C 12/15/22 1816    Arby Barrette, MD 12/16/22 1325

## 2022-12-16 ENCOUNTER — Encounter: Payer: Self-pay | Admitting: Pharmacist

## 2022-12-16 DIAGNOSIS — G720 Drug-induced myopathy: Secondary | ICD-10-CM

## 2022-12-16 NOTE — Assessment & Plan Note (Signed)
The current medical regimen is effective;  continue present plan and medications. Continue amlodipine 10 mg daily.  

## 2022-12-16 NOTE — Assessment & Plan Note (Addendum)
Stable

## 2022-12-16 NOTE — Assessment & Plan Note (Addendum)
Continue zoloft 100 mg daily.  

## 2022-12-16 NOTE — Progress Notes (Unsigned)
Subjective:  Patient ID: [name], female    DOB: Jun 11, 1966  Age: 57 y.o. MRN: [id number]  Chief Complaint  Patient presents with   Medical Management of Chronic Issues    HPI [name] is a 57 year old Caucasian female that presents for follow-up hyperlipidemia, hypertension, GERD, and anxiety with depression.   Lipid/Cholesterol, Follow-up   She was last seen for this 4 months ago.  Management since that visit includes Zetia. Intolerant to crestor and lipitor.     She is eating healthy. Not exercising.    Hypertension, follow-up: Management since that visit includes amlodipine 10 mg daily.      She is following a Regular diet. She is not exercising. She does not smoke.  Anxiety/Depression, Follow-up She was last seen for anxiety 4 months ago. Currently on zoloft 150 mg daily.            She feels her anxiety is moderate and Worse since last visit due to some bad situations with her ex boyfriend.   GERD, Follow up:  The patient was last seen for GERD. On no medicine. She is NOT experiencing chest pain, cough, or difficulty swallowing  History of kidney stones. Patient was seen at [facility] 2 days ago for possible kidney stone. All labs/UA/CT urogram were normal. Treated for MS source.        12/17/2022    8:52 AM 07/29/2022    3:22 PM 07/01/2022    4:25 PM  PHQ9 SCORE ONLY  PHQ-9 Total Score 4 0 0        12/17/2022    8:54 AM 04/24/2022    1:51 PM 10/16/2021    2:49 PM  GAD 7 : Generalized Anxiety Score  Nervous, Anxious, on Edge 1 3 3   Control/stop worrying 1 1 1   Worry too much - different things 1 1 2   Trouble relaxing 1 3 1   Restless 1 0 1  Easily annoyed or irritable 1 3 2   Afraid - awful might happen 0 0 0  Total GAD 7 Score 6 11 10   Anxiety Difficulty Somewhat difficult Somewhat difficult Somewhat difficult    Patient Care Team: Blane Ohara, MD as PCP - General (Family Medicine) Georgeanna Lea, MD as Consulting Physician  (Cardiology)   Review of Systems  Constitutional:  Negative for chills, fatigue and fever.  HENT:  Negative for congestion, rhinorrhea and sore throat.   Respiratory:  Negative for cough and shortness of breath.   Cardiovascular:  Negative for chest pain.  Gastrointestinal:  Negative for abdominal pain, constipation, diarrhea, nausea and vomiting.  Genitourinary:  Negative for dysuria and urgency.  Musculoskeletal:  Positive for back pain. Negative for myalgias.  Neurological:  Negative for dizziness, weakness, light-headedness and headaches.  Psychiatric/Behavioral:  Positive for dysphoric mood. The patient is nervous/anxious.     Current Outpatient Medications on File Prior to Visit  Medication Sig Dispense Refill   acidophilus (RISAQUAD) CAPS capsule Take 1 capsule by mouth daily.     albuterol (VENTOLIN HFA) 108 (90 Base) MCG/ACT inhaler Inhale 1-2 puffs into the lungs every 6 (six) hours as needed for wheezing or shortness of breath.     amLODipine (NORVASC) 10 MG tablet Take 1 tablet (10 mg total) by mouth daily. 90 tablet 1   aspirin EC 81 MG tablet Take 81 mg by mouth daily. Swallow whole.     ezetimibe (ZETIA) 10 MG tablet Take 1 tablet (10 mg total) by mouth daily. 90 tablet 1  fluticasone (FLONASE) 50 MCG/ACT nasal spray Place 1 spray into both nostrils daily.     Fluticasone-Umeclidin-Vilant (TRELEGY ELLIPTA) 200-62.5-25 MCG/ACT AEPB Inhale 1 Inhalation into the lungs daily. 1 each 11   furosemide (LASIX) 20 MG tablet Take 1 tablet (20 mg total) by mouth as needed for fluid or edema. 90 tablet 0   ibuprofen (ADVIL) 600 MG tablet Take 1 tablet (600 mg total) by mouth every 6 (six) hours as needed. 30 tablet 0   Multiple Vitamin (MULTIVITAMIN) tablet Take 1 tablet by mouth daily.     nitroGLYCERIN (NITROSTAT) 0.4 MG SL tablet Place 1 tablet (0.4 mg total) under the tongue every 5 (five) minutes as needed for chest pain (x3 total. call ems if chest pain does not resolve after one,  call ems.). 25 tablet 0   sertraline (ZOLOFT) 100 MG tablet TAKE 1 & 1/2 TABLET BY MOUTH ONCE DAILY. 135 tablet 0   tiZANidine (ZANAFLEX) 4 MG tablet Take 1 tablet (4 mg total) by mouth every 8 (eight) hours as needed for muscle spasms. 10 tablet 0   No current facility-administered medications on file prior to visit.   Past Medical History:  Diagnosis Date   Allergic reaction to bee sting 09/09/2018   Anxiety    Asthma    Bacterial vaginosis 07/07/2019   Bulging lumbar disc 07/22/2015   Cardiomyopathy (HCC) 07/18/2015   Overview:  Overview:  [place] 2009, normal coronaries  Last Assessment & Plan:  Relevant Hx: Course: Daily Update: Today's Plan: she has recently been seen by cards and is planned to have echo this Friday  Electronically signed by: Jenelle Mages, FNP 07/24/15 1042   Carpal tunnel syndrome 07/22/2015   Cervical strain 01/11/2019   Chronic fatigue 07/22/2015   Chronic rhinitis 07/22/2015   Depression    Dyslipidemia 07/18/2015   Esophageal stricture 07/22/2015   Essential hypertension    Eustachian tube dysfunction 07/22/2015   Excessive daytime sleepiness 05/16/2019   Gastro-esophageal reflux disease without esophagitis    Gastroesophageal reflux disease without esophagitis 07/18/2015   Last Assessment & Plan:  Relevant Hx: Course: Daily Update: Today's Plan:appears stable with ppi  Electronically signed by: Jenelle Mages, FNP 07/24/15 1043   High risk medication use 07/22/2015   History of blood clots    Hot flash, menopausal 07/22/2015   Hyperlipidemia 07/22/2015   Last Assessment & Plan:  Relevant Hx: Course: Daily Update: Today's Plan:stable with meds  Electronically signed by: Jenelle Mages, FNP 07/24/15 1112   Hypertension    Localized swelling of both lower legs 07/22/2015   Localized swelling of both lower legs 07/22/2015   Low back strain, initial encounter 07/07/2019   Lung mass    Mixed hyperlipidemia 07/22/2015   Last Assessment & Plan:  Formatting of  this note might be different from the original. Relevant Hx: Course: Daily Update: Today's Plan:stable with meds  Electronically signed by: Jenelle Mages, FNP 07/24/15 1112   Morbid obesity (HCC) 07/22/2015   Other insomnia 11/22/2018   Pneumonia due to COVID-19 virus 01/17/2020   Pulmonary embolus (HCC) 01/18/2020   Screening for osteoporosis 02/27/2017   Formatting of this note might be different from the original. 2019: spine 3.1, L femur 1.3   Seasonal allergic rhinitis due to pollen 07/22/2015   Snores 07/22/2015   Last Assessment & Plan:  Relevant Hx: Course: Daily Update: Today's Plan:she was to have psg, but has not had this yet. Discussed that if this is present could be  worse post-op with pain meds. None the less, she will have protective airway with/during surgery. She should still have psg study, but wants to wait until after knee repair  Electronically signed by: Jenelle Mages, FNP 07/24/15   Past Surgical History:  Procedure Laterality Date   ABDOMINAL HYSTERECTOMY     CARPAL TUNNEL RELEASE Right    CHOLECYSTECTOMY     ECTOPIC PREGNANCY SURGERY     X 2   FOOT SURGERY     Knee replcement     POLYPECTOMY     TONSILECTOMY/ADENOIDECTOMY WITH MYRINGOTOMY  1972   TOTAL HIP ARTHROPLASTY Right    WISDOM TOOTH EXTRACTION      Family History  Problem Relation Age of Onset   Dementia Mother    Heart attack Father    Heart disease Father    Leukemia Half-Brother    Multiple myeloma Half-Brother    Diabetes Half-Sister    Breast cancer Neg Hx    Social History   Socioeconomic History   Marital status: Divorced    Spouse name: NONE   Number of children: 0   Years of education: 12 +  [employer]   Highest education level: Not on file  Occupational History   Occupation: DIABLED  Tobacco Use   Smoking status: Former    Types: Cigarettes    Quit date: 06/16/2006    Years since quitting: 16.5   Smokeless tobacco: Never  Vaping Use   Vaping Use: Never used   Substance and Sexual Activity   Alcohol use: Yes    Alcohol/week: 3.0 - 4.0 standard drinks of alcohol    Types: 3 - 4 Cans of beer per week   Drug use: No   Sexual activity: Yes  Other Topics Concern   Not on file  Social History Narrative   Not on file   Social Determinants of Health   Financial Resource Strain: Low Risk  (02/16/2021)   Overall Financial Resource Strain (CARDIA)    Difficulty of Paying Living Expenses: Not hard at all  Food Insecurity: No Food Insecurity (02/16/2021)   Hunger Vital Sign    Worried About Running Out of Food in the Last Year: Never true    Ran Out of Food in the Last Year: Never true  Transportation Needs: No Transportation Needs (02/16/2021)   PRAPARE - Administrator, Civil Service (Medical): No    Lack of Transportation (Non-Medical): No  Physical Activity: Inactive (12/17/2022)   Exercise Vital Sign    Days of Exercise per Week: 0 days    Minutes of Exercise per Session: 0 min  Stress: No Stress Concern Present (02/16/2021)   [employer] of Occupational Health - Occupational Stress Questionnaire    Feeling of Stress : Not at all  Social Connections: Socially Isolated (12/17/2022)   Social Connection and Isolation Panel [NHANES]    Frequency of Communication with Friends and Family: More than three times a week    Frequency of Social Gatherings with Friends and Family: Twice a week    Attends Religious Services: Never    Database administrator or Organizations: No    Attends Engineer, structural: Never    Marital Status: Divorced    Objective:  BP 124/78   Pulse 78   Temp (!) 96 F (35.6 C)   Resp 16   Ht 5\' 3"  (1.6 m)   Wt 205 lb (93 kg)   LMP  (LMP Unknown)   BMI 36.31  kg/m      12/17/2022    8:47 AM 12/15/2022   12:23 PM 12/15/2022   12:21 PM  BP/Weight  Systolic BP 124 145   Diastolic BP 78 92   Wt. (Lbs) 205  214.95  BMI 36.31 kg/m2  38.08 kg/m2    Physical Exam  Diabetic Foot Exam - Simple   No  data filed      Lab Results  Component Value Date   WBC 9.1 12/15/2022   HGB 14.5 12/15/2022   HCT 42.4 12/15/2022   PLT 365 12/15/2022   GLUCOSE 135 (H) 12/15/2022   CHOL 227 (H) 07/31/2022   TRIG 109 07/31/2022   HDL 81 07/31/2022   LDLCALC 127 (H) 07/31/2022   ALT 14 07/31/2022   AST 18 07/31/2022   NA 135 12/15/2022   K 3.2 (L) 12/15/2022   CL 98 12/15/2022   CREATININE 0.70 12/15/2022   BUN 11 12/15/2022   CO2 27 12/15/2022   TSH 2.450 07/31/2022   HGBA1C 5.0 11/23/2020      Assessment & Plan:    Essential hypertension Assessment & Plan: The current medical regimen is effective;  continue present plan and medications. Continue amlodipine 10 mg daily.   Orders: -     CBC with Differential/Platelet -     Comprehensive metabolic panel  Gastroesophageal reflux disease without esophagitis Assessment & Plan: Stable   Mixed hyperlipidemia Assessment & Plan: Well controlled. Continue zetia 10 mg daily. Continue to work on eating a healthy diet and exercise.  Labs drawn today.    Orders: -     Lipid panel  GAD (generalized anxiety disorder) Assessment & Plan: Continue zoloft 100 mg daily.     Encounter for immunization     No orders of the defined types were placed in this encounter.   Orders Placed This Encounter  Procedures   CBC with Differential/Platelet   Comprehensive metabolic panel   Lipid panel     Follow-up: Return in about 4 months (around 04/19/2023) for  lab visit (next week for fasting lipid panel. , chronic follow up, fasting.   I,Marla I Leal-Borjas,acting as a scribe for Blane Ohara, MD.,have documented all relevant documentation on the behalf of Blane Ohara, MD,as directed by  Blane Ohara, MD while in the presence of Blane Ohara, MD.   An After Visit Summary was printed and given to the patient.  Blane Ohara, MD [facility] [phone]

## 2022-12-16 NOTE — Assessment & Plan Note (Addendum)
Well controlled. Continue zetia 10 mg daily. Continue to work on eating a healthy diet and exercise.  Labs drawn today.  Intolerant to statins.

## 2022-12-16 NOTE — Progress Notes (Signed)
[facility])                                            [facility] Quality Pharmacy Team                                        Statin Quality Measure Assessment    12/16/2022  [name] Jan 15, 1966 [id number]  Dr. Sedalia Muta,   Per review of chart and payor information, patient has a diagnosis of cardiovascular disease but is not currently filling a statin prescription.  This places patient into the [facility] (Statin Use In Patients with Cardiovascular Disease) measure for CMS.    Patient has documented trials of statins with reported myalgia, but no corresponding CPT codes that would exclude patient from [facility] measure.  Patient has an upcoming appointment 12/17/22.  If deemed therapeutically appropriate, a statin exclusion code could be associated with the upcoming visit.  Associating a code will remove the patient from the measure. She is on ezetimibe.     Component Value Date/Time   CHOL 227 (H) 07/31/2022 1155   TRIG 109 07/31/2022 1155   HDL 81 07/31/2022 1155   CHOLHDL 2.8 07/31/2022 1155   LDLCALC 127 (H) 07/31/2022 1155     Please consider ONE of the following recommendations:  Initiate high intensity statin Atorvastatin 40mg  once daily, #90, 3 refills   Rosuvastatin 20mg  once daily, #90, 3 refills    Initiate moderate intensity  statin with reduced frequency if prior  statin intolerance 1x weekly, #13, 3 refills   2x weekly, #26, 3 refills   3x weekly, #39, 3 refills    Code for past statin intolerance  (required annually)   Provider Requirements:  Must asociate code during an office visit or telehealth encounter   Drug Induced Myopathy G72.0 (SUPD & SPC)   Myalgia M79.1 (SPC-ONLY)   Myositis, unspecified M60.9   Myopathy, unspecified G72.9   Rhabdomyolysis M62.82     Plan: Route note to Provider prior to upcoming appointment.   Beecher Mcardle, PharmD, BCACP [phone]

## 2022-12-16 NOTE — Assessment & Plan Note (Signed)
>>  ASSESSMENT AND PLAN FOR ESSENTIAL HYPERTENSION WRITTEN ON 12/16/2022 10:23 PM BY LEAL-BORJAS, Jess Sulak I, CMA  The current medical regimen is effective;  continue present plan and medications. Continue amlodipine  10 mg daily.

## 2022-12-17 ENCOUNTER — Encounter: Payer: Self-pay | Admitting: Family Medicine

## 2022-12-17 ENCOUNTER — Ambulatory Visit (INDEPENDENT_AMBULATORY_CARE_PROVIDER_SITE_OTHER): Payer: Medicare HMO | Admitting: Family Medicine

## 2022-12-17 VITALS — BP 124/78 | HR 78 | Temp 96.0°F | Resp 16 | Ht 63.0 in | Wt 205.0 lb

## 2022-12-17 DIAGNOSIS — I10 Essential (primary) hypertension: Secondary | ICD-10-CM

## 2022-12-17 DIAGNOSIS — K219 Gastro-esophageal reflux disease without esophagitis: Secondary | ICD-10-CM

## 2022-12-17 DIAGNOSIS — F411 Generalized anxiety disorder: Secondary | ICD-10-CM | POA: Diagnosis not present

## 2022-12-17 DIAGNOSIS — M791 Myalgia, unspecified site: Secondary | ICD-10-CM | POA: Diagnosis not present

## 2022-12-17 DIAGNOSIS — Z6836 Body mass index (BMI) 36.0-36.9, adult: Secondary | ICD-10-CM

## 2022-12-17 DIAGNOSIS — E782 Mixed hyperlipidemia: Secondary | ICD-10-CM | POA: Diagnosis not present

## 2022-12-17 DIAGNOSIS — F330 Major depressive disorder, recurrent, mild: Secondary | ICD-10-CM | POA: Diagnosis not present

## 2022-12-17 DIAGNOSIS — T466X5A Adverse effect of antihyperlipidemic and antiarteriosclerotic drugs, initial encounter: Secondary | ICD-10-CM

## 2022-12-17 DIAGNOSIS — Z23 Encounter for immunization: Secondary | ICD-10-CM

## 2022-12-17 NOTE — Patient Instructions (Signed)
Needs Tdap and Shingrix at the pharmacy.   Continue with healthy diet and exercise.

## 2022-12-19 NOTE — Assessment & Plan Note (Signed)
The current medical regimen is effective;  continue present plan and medications. Continue zoloft 150 mg daily.

## 2022-12-21 ENCOUNTER — Encounter: Payer: Self-pay | Admitting: Family Medicine

## 2022-12-21 DIAGNOSIS — M791 Myalgia, unspecified site: Secondary | ICD-10-CM | POA: Insufficient documentation

## 2022-12-21 DIAGNOSIS — Z23 Encounter for immunization: Secondary | ICD-10-CM | POA: Insufficient documentation

## 2022-12-21 NOTE — Assessment & Plan Note (Signed)
Encouragement given.  Continue to work on weight loss. Comorbidities include hyperlipidemia and hypertension.

## 2022-12-21 NOTE — Addendum Note (Signed)
Addended byBlane Ohara on: 12/21/2022 09:31 PM   Modules accepted: Level of Service

## 2022-12-21 NOTE — Assessment & Plan Note (Signed)
Intolerant to statins.  Failed on 2.

## 2022-12-23 ENCOUNTER — Other Ambulatory Visit: Payer: Medicare HMO

## 2022-12-23 DIAGNOSIS — E782 Mixed hyperlipidemia: Secondary | ICD-10-CM | POA: Diagnosis not present

## 2022-12-24 LAB — LIPID PANEL
Chol/HDL Ratio: 2.8 ratio (ref 0.0–4.4)
Cholesterol, Total: 252 mg/dL — ABNORMAL HIGH (ref 100–199)
HDL: 89 mg/dL (ref >39)
LDL Chol Calc (NIH): 132 mg/dL — ABNORMAL HIGH (ref 0–99)
Triglycerides: 181 mg/dL — ABNORMAL HIGH (ref 0–149)
VLDL Cholesterol Cal: 31 mg/dL (ref 5–40)

## 2023-01-23 DIAGNOSIS — I10 Essential (primary) hypertension: Secondary | ICD-10-CM | POA: Diagnosis not present

## 2023-01-23 DIAGNOSIS — I252 Old myocardial infarction: Secondary | ICD-10-CM | POA: Diagnosis not present

## 2023-01-23 DIAGNOSIS — E785 Hyperlipidemia, unspecified: Secondary | ICD-10-CM | POA: Diagnosis not present

## 2023-01-23 DIAGNOSIS — T63463A Toxic effect of venom of wasps, assault, initial encounter: Secondary | ICD-10-CM | POA: Diagnosis not present

## 2023-01-23 DIAGNOSIS — T63461A Toxic effect of venom of wasps, accidental (unintentional), initial encounter: Secondary | ICD-10-CM | POA: Diagnosis not present

## 2023-03-20 ENCOUNTER — Other Ambulatory Visit: Payer: Self-pay | Admitting: Family Medicine

## 2023-03-20 DIAGNOSIS — Z0000 Encounter for general adult medical examination without abnormal findings: Secondary | ICD-10-CM

## 2023-04-01 ENCOUNTER — Ambulatory Visit
Admission: RE | Admit: 2023-04-01 | Discharge: 2023-04-01 | Disposition: A | Discharge status: Home / Self Care | Payer: Medicare HMO | Source: Ambulatory Visit | Attending: Family Medicine | Admitting: Family Medicine

## 2023-04-01 DIAGNOSIS — Z1231 Encounter for screening mammogram for malignant neoplasm of breast: Secondary | ICD-10-CM | POA: Diagnosis not present

## 2023-04-01 DIAGNOSIS — Z0000 Encounter for general adult medical examination without abnormal findings: Secondary | ICD-10-CM

## 2023-04-19 NOTE — Assessment & Plan Note (Signed)
The current medical regimen is effective;  continue present plan and medications. Continue amlodipine 10 mg daily.  

## 2023-04-19 NOTE — Assessment & Plan Note (Signed)
>>  ASSESSMENT AND PLAN FOR ESSENTIAL HYPERTENSION WRITTEN ON 04/19/2023  9:31 PM BY LEAL-BORJAS, Liridona Mashaw I, CMA  The current medical regimen is effective;  continue present plan and medications. Continue amlodipine  10 mg daily.

## 2023-04-19 NOTE — Assessment & Plan Note (Signed)
Well controlled. Continue zetia 10 mg daily. Continue to work on eating a healthy diet and exercise.  Labs drawn today.  Intolerant to statins.

## 2023-04-19 NOTE — Assessment & Plan Note (Signed)
The current medical regimen is effective;  continue present plan and medications. Continue zoloft 100 mg daily.

## 2023-04-19 NOTE — Assessment & Plan Note (Signed)
Stable without medication

## 2023-04-19 NOTE — Progress Notes (Unsigned)
Subjective:  Patient ID: [name], female    DOB: Oct 14, 1965  Age: 58 y.o. MRN: [id number]  Chief Complaint  Patient presents with   Medical Management of Chronic Issues    HPI   [name] is a 57 year old Caucasian female that presents for follow-up hyperlipidemia, hypertension, GERD, and anxiety with depression.   Lipid/Cholesterol, Follow-up She was last seen for this 4 months ago.  Management since that visit includes Zetia. Intolerant to crestor and lipitor.     She is eating healthy. Not exercising.   Hypertension, follow-up: Management since that visit includes amlodipine 10 mg daily.      She is following a Regular diet. She is not exercising. She does not smoke.  Anxiety/Depression, Follow-up She was last seen for anxiety 4 months ago. Currently on zoloft 150 mg daily.            She feels her anxiety is moderate and Worse since last visit due to some bad situations with her ex boyfriend.  GERD, Follow up: The patient was last seen for GERD. On no medicine. She is NOT experiencing chest pain, cough, or difficulty swallowing.     04/20/2023    9:02 AM 12/17/2022    8:52 AM 07/29/2022    3:22 PM 07/01/2022    4:25 PM 04/26/2022    5:22 PM  Depression screen PHQ 2/9  Decreased Interest 1 0 0 0 0  Down, Depressed, Hopeless 1 1 0 0 0  PHQ - 2 Score 2 1 0 0 0  Altered sleeping 1 1 0 0 0  Tired, decreased energy 1 1 0 0 0  Change in appetite 0 0 0 0 0  Feeling bad or failure about yourself  0 0 0 0 0  Trouble concentrating 2 0 0 0 0  Moving slowly or fidgety/restless 3 1 0 0 0  Suicidal thoughts 0 0 0 0 0  PHQ-9 Score 9 4 0 0 0  Difficult doing work/chores Somewhat difficult Not difficult at all Not difficult at all Not difficult at all Not difficult at all        12/17/2022    8:54 AM 04/24/2022    1:51 PM 10/16/2021    2:49 PM  GAD 7 : Generalized Anxiety Score  Nervous, Anxious, on Edge 1 3 3   Control/stop worrying 1 1 1   Worry too much - different things 1 1  2   Trouble relaxing 1 3 1   Restless 1 0 1  Easily annoyed or irritable 1 3 2   Afraid - awful might happen 0 0 0  Total GAD 7 Score 6 11 10   Anxiety Difficulty Somewhat difficult Somewhat difficult Somewhat difficult        04/20/2023    9:04 AM  Fall Risk   Falls in the past year? 1  Number falls in past yr: 1  Injury with Fall? 0  Risk for fall due to : No Fall Risks  Follow up Falls evaluation completed    Patient Care Team: Blane Ohara, MD as PCP - General (Family Medicine) Georgeanna Lea, MD as Consulting Physician (Cardiology)   Review of Systems  Constitutional:  Negative for chills, fatigue and fever.  HENT:  Positive for rhinorrhea. Negative for congestion, ear pain and sore throat.   Respiratory:  Negative for cough and shortness of breath.   Cardiovascular:  Negative for chest pain.  Gastrointestinal:  Negative for abdominal pain, constipation, diarrhea, nausea and vomiting.  Genitourinary:  Negative for dysuria  and urgency.  Musculoskeletal:  Positive for arthralgias (left thumb pain). Negative for back pain and myalgias.  Neurological:  Negative for dizziness, weakness, light-headedness and headaches.  Psychiatric/Behavioral:  Negative for dysphoric mood. The patient is not nervous/anxious.     Current Outpatient Medications on File Prior to Visit  Medication Sig Dispense Refill   acidophilus (RISAQUAD) CAPS capsule Take 1 capsule by mouth daily.     albuterol (VENTOLIN HFA) 108 (90 Base) MCG/ACT inhaler Inhale 1-2 puffs into the lungs every 6 (six) hours as needed for wheezing or shortness of breath.     amLODipine (NORVASC) 10 MG tablet Take 1 tablet (10 mg total) by mouth daily. 90 tablet 1   aspirin EC 81 MG tablet Take 81 mg by mouth daily. Swallow whole.     ezetimibe (ZETIA) 10 MG tablet Take 1 tablet (10 mg total) by mouth daily. 90 tablet 1   fluticasone (FLONASE) 50 MCG/ACT nasal spray Place 1 spray into both nostrils daily.      Fluticasone-Umeclidin-Vilant (TRELEGY ELLIPTA) 200-62.5-25 MCG/ACT AEPB Inhale 1 Inhalation into the lungs daily. 1 each 11   furosemide (LASIX) 20 MG tablet Take 1 tablet (20 mg total) by mouth as needed for fluid or edema. 90 tablet 0   ibuprofen (ADVIL) 600 MG tablet Take 1 tablet (600 mg total) by mouth every 6 (six) hours as needed. 30 tablet 0   Multiple Vitamin (MULTIVITAMIN) tablet Take 1 tablet by mouth daily.     nitroGLYCERIN (NITROSTAT) 0.4 MG SL tablet Place 1 tablet (0.4 mg total) under the tongue every 5 (five) minutes as needed for chest pain (x3 total. call ems if chest pain does not resolve after one, call ems.). 25 tablet 0   sertraline (ZOLOFT) 100 MG tablet TAKE 1 & 1/2 TABLET BY MOUTH ONCE DAILY. 135 tablet 0   tiZANidine (ZANAFLEX) 4 MG tablet Take 1 tablet (4 mg total) by mouth every 8 (eight) hours as needed for muscle spasms. 10 tablet 0   No current facility-administered medications on file prior to visit.   Past Medical History:  Diagnosis Date   Allergic reaction to bee sting 09/09/2018   Anxiety    Asthma    Bacterial vaginosis 07/07/2019   Bulging lumbar disc 07/22/2015   Cardiomyopathy (HCC) 07/18/2015   Overview:  Overview:  Taka Tsubo 2009, normal coronaries  Last Assessment & Plan:  Relevant Hx: Course: Daily Update: Today's Plan: she has recently been seen by cards and is planned to have echo this Friday  Electronically signed by: Jenelle Mages, FNP 07/24/15 1042   Carpal tunnel syndrome 07/22/2015   Cervical strain 01/11/2019   Chronic fatigue 07/22/2015   Chronic rhinitis 07/22/2015   Depression    Dyslipidemia 07/18/2015   Esophageal stricture 07/22/2015   Essential hypertension    Eustachian tube dysfunction 07/22/2015   Excessive daytime sleepiness 05/16/2019   Gastro-esophageal reflux disease without esophagitis    Gastroesophageal reflux disease without esophagitis 07/18/2015   Last Assessment & Plan:  Relevant Hx: Course: Daily Update: Today's  Plan:appears stable with ppi  Electronically signed by: Jenelle Mages, FNP 07/24/15 1043   High risk medication use 07/22/2015   History of blood clots    Hot flash, menopausal 07/22/2015   Hyperlipidemia 07/22/2015   Last Assessment & Plan:  Relevant Hx: Course: Daily Update: Today's Plan:stable with meds  Electronically signed by: Jenelle Mages, FNP 07/24/15 1112   Hypertension    Localized swelling of both lower legs 07/22/2015  Localized swelling of both lower legs 07/22/2015   Low back strain, initial encounter 07/07/2019   Lung mass    Mixed hyperlipidemia 07/22/2015   Last Assessment & Plan:  Formatting of this note might be different from the original. Relevant Hx: Course: Daily Update: Today's Plan:stable with meds  Electronically signed by: Jenelle Mages, FNP 07/24/15 1112   Morbid obesity (HCC) 07/22/2015   Other insomnia 11/22/2018   Pneumonia due to COVID-19 virus 01/17/2020   Pulmonary embolus (HCC) 01/18/2020   Screening for osteoporosis 02/27/2017   Formatting of this note might be different from the original. 2019: spine 3.1, L femur 1.3   Seasonal allergic rhinitis due to pollen 07/22/2015   Snores 07/22/2015   Last Assessment & Plan:  Relevant Hx: Course: Daily Update: Today's Plan:she was to have psg, but has not had this yet. Discussed that if this is present could be worse post-op with pain meds. None the less, she will have protective airway with/during surgery. She should still have psg study, but wants to wait until after knee repair  Electronically signed by: Jenelle Mages, FNP 07/24/15   Past Surgical History:  Procedure Laterality Date   ABDOMINAL HYSTERECTOMY     CARPAL TUNNEL RELEASE Right    CHOLECYSTECTOMY     ECTOPIC PREGNANCY SURGERY     X 2   FOOT SURGERY     Knee replcement     POLYPECTOMY     TONSILECTOMY/ADENOIDECTOMY WITH MYRINGOTOMY  1972   TOTAL HIP ARTHROPLASTY Right    WISDOM TOOTH EXTRACTION      Family History  Problem  Relation Age of Onset   Dementia Mother    Heart attack Father    Heart disease Father    Leukemia Half-Brother    Multiple myeloma Half-Brother    Diabetes Half-Sister    Breast cancer Neg Hx    Social History   Socioeconomic History   Marital status: Divorced    Spouse name: NONE   Number of children: 0   Years of education: 12 +  SOME [employer]   Highest education level: Not on file  Occupational History   Occupation: DIABLED  Tobacco Use   Smoking status: Former    Current packs/day: 0.00    Types: Cigarettes    Quit date: 06/16/2006    Years since quitting: 16.8   Smokeless tobacco: Never  Vaping Use   Vaping status: Never Used  Substance and Sexual Activity   Alcohol use: Yes    Alcohol/week: 3.0 - 4.0 standard drinks of alcohol    Types: 3 - 4 Cans of beer per week   Drug use: No   Sexual activity: Yes  Other Topics Concern   Not on file  Social History Narrative   Not on file   Social Determinants of Health   Financial Resource Strain: Low Risk  (02/16/2021)   Overall Financial Resource Strain (CARDIA)    Difficulty of Paying Living Expenses: Not hard at all  Food Insecurity: No Food Insecurity (02/16/2021)   Hunger Vital Sign    Worried About Running Out of Food in the Last Year: Never true    Ran Out of Food in the Last Year: Never true  Transportation Needs: No Transportation Needs (02/16/2021)   PRAPARE - Administrator, Civil Service (Medical): No    Lack of Transportation (Non-Medical): No  Physical Activity: Inactive (12/17/2022)   Exercise Vital Sign    Days of Exercise per Week: 0 days  Minutes of Exercise per Session: 0 min  Stress: No Stress Concern Present (02/16/2021)   [employer] of Occupational Health - Occupational Stress Questionnaire    Feeling of Stress : Not at all  Social Connections: Socially Isolated (12/17/2022)   Social Connection and Isolation Panel [NHANES]    Frequency of Communication with Friends and Family: More  than three times a week    Frequency of Social Gatherings with Friends and Family: Twice a week    Attends Religious Services: Never    Diplomatic Services operational officer: No    Attends Engineer, structural: Never    Marital Status: Divorced    Objective:  BP 120/80   Pulse 74   Temp (!) 96.8 F (36 C)   Ht 5\' 3"  (1.6 m)   Wt 201 lb (91.2 kg)   LMP  (LMP Unknown)   SpO2 99%   BMI 35.61 kg/m      04/20/2023    8:58 AM 12/17/2022    8:47 AM 12/15/2022   12:23 PM  BP/Weight  Systolic BP 120 124 145  Diastolic BP 80 78 92  Wt. (Lbs) 201 205   BMI 35.61 kg/m2 36.31 kg/m2     Physical Exam Vitals reviewed.  Constitutional:      Appearance: Normal appearance. She is normal weight.  Neck:     Vascular: No carotid bruit.  Cardiovascular:     Rate and Rhythm: Normal rate and regular rhythm.     Heart sounds: Normal heart sounds.  Pulmonary:     Effort: Pulmonary effort is normal. No respiratory distress.     Breath sounds: Normal breath sounds.  Abdominal:     General: Abdomen is flat. Bowel sounds are normal.     Palpations: Abdomen is soft.     Tenderness: There is no abdominal tenderness.  Neurological:     Mental Status: She is alert and oriented to person, place, and time.  Psychiatric:        Mood and Affect: Mood normal.        Behavior: Behavior normal.     Diabetic Foot Exam - Simple   No data filed      Lab Results  Component Value Date   WBC 9.1 12/15/2022   HGB 14.5 12/15/2022   HCT 42.4 12/15/2022   PLT 365 12/15/2022   GLUCOSE 135 (H) 12/15/2022   CHOL 252 (H) 12/23/2022   TRIG 181 (H) 12/23/2022   HDL 89 12/23/2022   LDLCALC 132 (H) 12/23/2022   ALT 14 07/31/2022   AST 18 07/31/2022   NA 135 12/15/2022   K 3.2 (L) 12/15/2022   CL 98 12/15/2022   CREATININE 0.70 12/15/2022   BUN 11 12/15/2022   CO2 27 12/15/2022   TSH 2.450 07/31/2022   HGBA1C 5.0 11/23/2020      Assessment & Plan:    Essential  hypertension Assessment & Plan: The current medical regimen is effective;  continue present plan and medications. Continue amlodipine 10 mg daily.   Orders: -     CBC with Differential/Platelet -     Comprehensive metabolic panel  Gastroesophageal reflux disease without esophagitis Assessment & Plan: Stable without medication.   Mixed hyperlipidemia Assessment & Plan: Well controlled. Continue zetia 10 mg daily. Continue to work on eating a healthy diet and exercise.  Labs drawn today.  Intolerant to statins.   Orders: -     Lipid panel  Mild recurrent major depression (HCC) Assessment &  Plan: The current medical regimen is effective;  continue present plan and medications. Continue zoloft 100 mg daily.    GAD (generalized anxiety disorder) Assessment & Plan: Add wellbutrin xl 150 mg daily in am.  This may also help with some of her attention issues.   Orders: -     buPROPion HCl ER (XL); Take 1 tablet (150 mg total) by mouth daily.  Dispense: 30 tablet; Refill: 2  Severe obesity with body mass index (BMI) of 35.0 to 35.9 and comorbidity [facility]) Assessment & Plan: Encouragement given.  Continue to work on weight loss. Recommend continue to work on eating healthy diet and exercise. Comorbidities include hyperlipidemia and hypertension.  Orders: -     buPROPion HCl ER (XL); Take 1 tablet (150 mg total) by mouth daily.  Dispense: 30 tablet; Refill: 2  Allergy to bee sting -     EPINEPHrine; Inject 0.3 mg into the muscle as needed for anaphylaxis.  Dispense: 2 each; Refill: 1     Meds ordered this encounter  Medications   buPROPion (WELLBUTRIN XL) 150 MG 24 hr tablet    Sig: Take 1 tablet (150 mg total) by mouth daily.    Dispense:  30 tablet    Refill:  2   EPINEPHrine 0.3 mg/0.3 mL IJ SOAJ injection    Sig: Inject 0.3 mg into the muscle as needed for anaphylaxis.    Dispense:  2 each    Refill:  1    Orders Placed This Encounter  Procedures   CBC with  Differential/Platelet   Comprehensive metabolic panel   Lipid panel     Follow-up: Return in about 4 weeks (around 05/18/2023) for depression follow up, [name]. Needs 3 month follow up fasting also. Clayborn Bigness I Leal-Borjas,acting as a scribe for Blane Ohara, MD.,have documented all relevant documentation on the behalf of Blane Ohara, MD,as directed by  Blane Ohara, MD while in the presence of Blane Ohara, MD.   An After Visit Summary was printed and given to the patient.  Blane Ohara, MD [facility] [phone]

## 2023-04-20 ENCOUNTER — Encounter: Payer: Self-pay | Admitting: Family Medicine

## 2023-04-20 ENCOUNTER — Ambulatory Visit (INDEPENDENT_AMBULATORY_CARE_PROVIDER_SITE_OTHER): Payer: Medicare HMO | Admitting: Family Medicine

## 2023-04-20 VITALS — BP 120/80 | HR 74 | Temp 96.8°F | Ht 63.0 in | Wt 201.0 lb

## 2023-04-20 DIAGNOSIS — E782 Mixed hyperlipidemia: Secondary | ICD-10-CM

## 2023-04-20 DIAGNOSIS — I10 Essential (primary) hypertension: Secondary | ICD-10-CM | POA: Diagnosis not present

## 2023-04-20 DIAGNOSIS — K219 Gastro-esophageal reflux disease without esophagitis: Secondary | ICD-10-CM

## 2023-04-20 DIAGNOSIS — Z91030 Bee allergy status: Secondary | ICD-10-CM | POA: Diagnosis not present

## 2023-04-20 DIAGNOSIS — F411 Generalized anxiety disorder: Secondary | ICD-10-CM | POA: Diagnosis not present

## 2023-04-20 DIAGNOSIS — Z6835 Body mass index (BMI) 35.0-35.9, adult: Secondary | ICD-10-CM

## 2023-04-20 DIAGNOSIS — F330 Major depressive disorder, recurrent, mild: Secondary | ICD-10-CM | POA: Diagnosis not present

## 2023-04-20 DIAGNOSIS — I110 Hypertensive heart disease with heart failure: Secondary | ICD-10-CM

## 2023-04-20 HISTORY — DX: Hypertensive heart disease with heart failure: I11.0

## 2023-04-20 LAB — LIPID PANEL
Chol/HDL Ratio: 2.3 ratio (ref 0.0–4.4)
Cholesterol, Total: 227 mg/dL — ABNORMAL HIGH (ref 100–199)
HDL: 99 mg/dL (ref >39)
LDL Chol Calc (NIH): 115 mg/dL — ABNORMAL HIGH (ref 0–99)
Triglycerides: 75 mg/dL (ref 0–149)
VLDL Cholesterol Cal: 13 mg/dL (ref 5–40)

## 2023-04-20 LAB — COMPREHENSIVE METABOLIC PANEL
ALT: 12 [IU]/L (ref 0–32)
AST: 22 [IU]/L (ref 0–40)
Albumin: 4.3 g/dL (ref 3.8–4.9)
Alkaline Phosphatase: 123 [IU]/L — ABNORMAL HIGH (ref 44–121)
BUN/Creatinine Ratio: 11 (ref 9–23)
BUN: 7 mg/dL (ref 6–24)
Bilirubin Total: 0.3 mg/dL (ref 0.0–1.2)
CO2: 26 mmol/L (ref 20–29)
Calcium: 9.6 mg/dL (ref 8.7–10.2)
Chloride: 103 mmol/L (ref 96–106)
Creatinine, Ser: 0.63 mg/dL (ref 0.57–1.00)
Globulin, Total: 2.2 g/dL (ref 1.5–4.5)
Glucose: 100 mg/dL — ABNORMAL HIGH (ref 70–99)
Potassium: 5.2 mmol/L (ref 3.5–5.2)
Sodium: 142 mmol/L (ref 134–144)
Total Protein: 6.5 g/dL (ref 6.0–8.5)
eGFR: 103 mL/min/{1.73_m2} (ref >59)

## 2023-04-20 LAB — CBC WITH DIFFERENTIAL/PLATELET
Basophils Absolute: 0 10*3/uL (ref 0.0–0.2)
Basos: 1 %
EOS (ABSOLUTE): 0.1 10*3/uL (ref 0.0–0.4)
Eos: 2 %
Hematocrit: 37.4 % (ref 34.0–46.6)
Hemoglobin: 12.5 g/dL (ref 11.1–15.9)
Immature Grans (Abs): 0 10*3/uL (ref 0.0–0.1)
Immature Granulocytes: 0 %
Lymphocytes Absolute: 1.4 10*3/uL (ref 0.7–3.1)
Lymphs: 26 %
MCH: 33 pg (ref 26.6–33.0)
MCHC: 33.4 g/dL (ref 31.5–35.7)
MCV: 99 fL — ABNORMAL HIGH (ref 79–97)
Monocytes Absolute: 0.5 10*3/uL (ref 0.1–0.9)
Monocytes: 10 %
Neutrophils Absolute: 3.2 10*3/uL (ref 1.4–7.0)
Neutrophils: 61 %
Platelets: 375 10*3/uL (ref 150–450)
RBC: 3.79 x10E6/uL (ref 3.77–5.28)
RDW: 12 % (ref 11.7–15.4)
WBC: 5.2 10*3/uL (ref 3.4–10.8)

## 2023-04-20 MED ORDER — EPINEPHRINE 0.3 MG/0.3ML IJ SOAJ
0.3000 mg | INTRAMUSCULAR | 1 refills | Status: AC | PRN
Start: 2023-04-20 — End: ?

## 2023-04-20 MED ORDER — BUPROPION HCL ER (XL) 150 MG PO TB24
150.0000 mg | ORAL_TABLET | Freq: Every day | ORAL | 2 refills | Status: DC
Start: 2023-04-20 — End: 2023-07-28

## 2023-04-20 NOTE — Assessment & Plan Note (Signed)
Add wellbutrin xl 150 mg daily in am.  This may also help with some of her attention issues.

## 2023-04-20 NOTE — Patient Instructions (Addendum)
Start wellbutrin xl 150 mg daily for depression and some attention issues.

## 2023-04-20 NOTE — Assessment & Plan Note (Signed)
Encouragement given.  Continue to work on weight loss. Recommend continue to work on eating healthy diet and exercise. Comorbidities include hyperlipidemia and hypertension.

## 2023-05-01 ENCOUNTER — Ambulatory Visit (INDEPENDENT_AMBULATORY_CARE_PROVIDER_SITE_OTHER): Payer: Medicare HMO | Admitting: Family Medicine

## 2023-05-01 ENCOUNTER — Encounter: Payer: Self-pay | Admitting: Family Medicine

## 2023-05-01 VITALS — BP 134/76 | HR 93 | Temp 97.3°F | Ht 63.0 in | Wt 201.0 lb

## 2023-05-01 DIAGNOSIS — R350 Frequency of micturition: Secondary | ICD-10-CM | POA: Diagnosis not present

## 2023-05-01 DIAGNOSIS — R051 Acute cough: Secondary | ICD-10-CM | POA: Diagnosis not present

## 2023-05-01 DIAGNOSIS — J4521 Mild intermittent asthma with (acute) exacerbation: Secondary | ICD-10-CM | POA: Diagnosis not present

## 2023-05-01 DIAGNOSIS — M7989 Other specified soft tissue disorders: Secondary | ICD-10-CM | POA: Diagnosis not present

## 2023-05-01 HISTORY — DX: Frequency of micturition: R35.0

## 2023-05-01 LAB — POCT URINALYSIS DIP (CLINITEK)
Bilirubin, UA: NEGATIVE
Blood, UA: NEGATIVE
Glucose, UA: NEGATIVE mg/dL
Ketones, POC UA: NEGATIVE mg/dL
Leukocytes, UA: NEGATIVE
Nitrite, UA: NEGATIVE
POC PROTEIN,UA: NEGATIVE
Spec Grav, UA: 1.01 (ref 1.010–1.025)
Urobilinogen, UA: 0.2 U/dL
pH, UA: 6 (ref 5.0–8.0)

## 2023-05-01 MED ORDER — AIRSUPRA 90-80 MCG/ACT IN AERO
1.0000 | INHALATION_SPRAY | Freq: Four times a day (QID) | RESPIRATORY_TRACT | 0 refills | Status: DC | PRN
Start: 2023-05-01 — End: 2023-07-28

## 2023-05-01 MED ORDER — AIRSUPRA 90-80 MCG/ACT IN AERO
1.0000 | INHALATION_SPRAY | Freq: Four times a day (QID) | RESPIRATORY_TRACT | Status: DC | PRN
Start: 2023-05-01 — End: 2023-05-01

## 2023-05-01 MED ORDER — IPRATROPIUM-ALBUTEROL 0.5-2.5 (3) MG/3ML IN SOLN
3.0000 mL | Freq: Once | RESPIRATORY_TRACT | Status: DC
Start: 2023-05-01 — End: 2023-10-28

## 2023-05-01 NOTE — Assessment & Plan Note (Addendum)
Acute Concern for possible kidney dysfunction. Last kidney function test on 04/20/23 showed normal GFR. -Order CMP to check current kidney function. -Await labs/testing for assessment and recommendations  -

## 2023-05-01 NOTE — Assessment & Plan Note (Addendum)
UTI - POC urine - negative -Culture sent -Check CBC -Drink plenty of fluids -Try AZO product with pyridium -Discontinue new soap -Remember to wipe from front to back and don't hold it in! If possible, empty your bladder every 4 hours.  -Call if the burning worsens, you develop a fever, back pain or pelvic pain.

## 2023-05-01 NOTE — Assessment & Plan Note (Addendum)
Reports chest congestion and coughing up sputum, possibly discolored. History of pneumonia and bronchitis. Decreased breath sounds and wheezing in left lower lobe. Currently using Albuterol inhaler and nebulizer. -Administer DuoNeb in office today. (Wheezing improved after treatment -Consider Airsupra inhaler as an alternative to Albuterol.  -Perform Pulmonary Function Test (PFT) today.

## 2023-05-01 NOTE — Progress Notes (Signed)
Acute Office Visit  Subjective:    Patient ID: [name], female    DOB: 10-01-1965, 57 y.o.   MRN: [id number]  Chief Complaint  Patient presents with   URI    HPI: Upper respiratory symptoms The patient, with a history of asthma, pneumonia, bronchitis, and severe allergies, presents with chest congestion and a productive cough. Onset of symptoms was  3 days ago and worsening.She is drinking plenty of fluids. She complains of congestion, nasal congestion, no  fever, and sinus pressure. They report coughing up sputum that may have had some color, but also note that they had consumed coffee prior to expectorating. They have not used their asthma inhalers today. They also mention a history of a pulmonary blood clot and recent bilateral leg swelling. They have a history of high iron levels and have undergone a power red procedure. They report poor sleep due to coughing and hacking. They have a history of joint replacements, including a new knee and hip, and have metal plates in their foot. They also report a history of smoking, but currently use a vape.  UTI They also report some urinary symptoms, including pain and urgency, and have recently changed their soap.   Past Medical History:  Diagnosis Date   Allergic reaction to bee sting 09/09/2018   Anxiety    Asthma    Bacterial vaginosis 07/07/2019   Bulging lumbar disc 07/22/2015   Cardiomyopathy (HCC) 07/18/2015   Overview:  Overview:  [place] 2009, normal coronaries  Last Assessment & Plan:  Relevant Hx: Course: Daily Update: Today's Plan: she has recently been seen by cards and is planned to have echo this Friday  Electronically signed by: Jenelle Mages, FNP 07/24/15 1042   Carpal tunnel syndrome 07/22/2015   Cervical strain 01/11/2019   Chronic fatigue 07/22/2015   Chronic rhinitis 07/22/2015   Depression    Dyslipidemia 07/18/2015   Esophageal stricture 07/22/2015   Essential hypertension    Eustachian tube dysfunction 07/22/2015    Excessive daytime sleepiness 05/16/2019   Gastro-esophageal reflux disease without esophagitis    Gastroesophageal reflux disease without esophagitis 07/18/2015   Last Assessment & Plan:  Relevant Hx: Course: Daily Update: Today's Plan:appears stable with ppi  Electronically signed by: Jenelle Mages, FNP 07/24/15 1043   High risk medication use 07/22/2015   History of blood clots    Hot flash, menopausal 07/22/2015   Hyperlipidemia 07/22/2015   Last Assessment & Plan:  Relevant Hx: Course: Daily Update: Today's Plan:stable with meds  Electronically signed by: Jenelle Mages, FNP 07/24/15 1112   Hypertension    Localized swelling of both lower legs 07/22/2015   Localized swelling of both lower legs 07/22/2015   Low back strain, initial encounter 07/07/2019   Lung mass    Mixed hyperlipidemia 07/22/2015   Last Assessment & Plan:  Formatting of this note might be different from the original. Relevant Hx: Course: Daily Update: Today's Plan:stable with meds  Electronically signed by: Jenelle Mages, FNP 07/24/15 1112   Morbid obesity (HCC) 07/22/2015   Other insomnia 11/22/2018   Pneumonia due to COVID-19 virus 01/17/2020   Pulmonary embolus (HCC) 01/18/2020   Screening for osteoporosis 02/27/2017   Formatting of this note might be different from the original. 2019: spine 3.1, L femur 1.3   Seasonal allergic rhinitis due to pollen 07/22/2015   Snores 07/22/2015   Last Assessment & Plan:  Relevant Hx: Course: Daily Update: Today's Plan:she was to have psg, but  has not had this yet. Discussed that if this is present could be worse post-op with pain meds. None the less, she will have protective airway with/during surgery. She should still have psg study, but wants to wait until after knee repair  Electronically signed by: Jenelle Mages, FNP 07/24/15    Past Surgical History:  Procedure Laterality Date   ABDOMINAL HYSTERECTOMY     CARPAL TUNNEL RELEASE Right    CHOLECYSTECTOMY      ECTOPIC PREGNANCY SURGERY     X 2   FOOT SURGERY     Knee replcement     POLYPECTOMY     TONSILECTOMY/ADENOIDECTOMY WITH MYRINGOTOMY  1972   TOTAL HIP ARTHROPLASTY Right    WISDOM TOOTH EXTRACTION      Family History  Problem Relation Age of Onset   Dementia Mother    Heart attack Father    Heart disease Father    Leukemia Half-Brother    Multiple myeloma Half-Brother    Diabetes Half-Sister    Breast cancer Neg Hx     Social History   Socioeconomic History   Marital status: Divorced    Spouse name: NONE   Number of children: 0   Years of education: 12 +  [employer]   Highest education level: Not on file  Occupational History   Occupation: DIABLED  Tobacco Use   Smoking status: Former    Current packs/day: 0.00    Types: Cigarettes    Quit date: 06/16/2006    Years since quitting: 16.8   Smokeless tobacco: Never  Vaping Use   Vaping status: Never Used  Substance and Sexual Activity   Alcohol use: Yes    Alcohol/week: 3.0 - 4.0 standard drinks of alcohol    Types: 3 - 4 Cans of beer per week   Drug use: No   Sexual activity: Yes  Other Topics Concern   Not on file  Social History Narrative   Not on file   Social Determinants of Health   Financial Resource Strain: Low Risk  (02/16/2021)   Overall Financial Resource Strain (CARDIA)    Difficulty of Paying Living Expenses: Not hard at all  Food Insecurity: No Food Insecurity (02/16/2021)   Hunger Vital Sign    Worried About Running Out of Food in the Last Year: Never true    Ran Out of Food in the Last Year: Never true  Transportation Needs: No Transportation Needs (02/16/2021)   PRAPARE - Administrator, Civil Service (Medical): No    Lack of Transportation (Non-Medical): No  Physical Activity: Inactive (12/17/2022)   Exercise Vital Sign    Days of Exercise per Week: 0 days    Minutes of Exercise per Session: 0 min  Stress: No Stress Concern Present (02/16/2021)   [employer] of Occupational  Health - Occupational Stress Questionnaire    Feeling of Stress : Not at all  Social Connections: Socially Isolated (12/17/2022)   Social Connection and Isolation Panel [NHANES]    Frequency of Communication with Friends and Family: More than three times a week    Frequency of Social Gatherings with Friends and Family: Twice a week    Attends Religious Services: Never    Database administrator or Organizations: No    Attends Banker Meetings: Never    Marital Status: Divorced  Catering manager Violence: Not At Risk (02/16/2021)   Humiliation, Afraid, Rape, and Kick questionnaire    Fear of Current or Ex-Partner: No  Emotionally Abused: No    Physically Abused: No    Sexually Abused: No    Outpatient Medications Prior to Visit  Medication Sig Dispense Refill   acidophilus (RISAQUAD) CAPS capsule Take 1 capsule by mouth daily.     albuterol (VENTOLIN HFA) 108 (90 Base) MCG/ACT inhaler Inhale 1-2 puffs into the lungs every 6 (six) hours as needed for wheezing or shortness of breath.     amLODipine (NORVASC) 10 MG tablet Take 1 tablet (10 mg total) by mouth daily. 90 tablet 1   aspirin EC 81 MG tablet Take 81 mg by mouth daily. Swallow whole.     buPROPion (WELLBUTRIN XL) 150 MG 24 hr tablet Take 1 tablet (150 mg total) by mouth daily. 30 tablet 2   EPINEPHrine 0.3 mg/0.3 mL IJ SOAJ injection Inject 0.3 mg into the muscle as needed for anaphylaxis. 2 each 1   ezetimibe (ZETIA) 10 MG tablet Take 1 tablet (10 mg total) by mouth daily. 90 tablet 1   fluticasone (FLONASE) 50 MCG/ACT nasal spray Place 1 spray into both nostrils daily.     Fluticasone-Umeclidin-Vilant (TRELEGY ELLIPTA) 200-62.5-25 MCG/ACT AEPB Inhale 1 Inhalation into the lungs daily. 1 each 11   furosemide (LASIX) 20 MG tablet Take 1 tablet (20 mg total) by mouth as needed for fluid or edema. 90 tablet 0   ibuprofen (ADVIL) 600 MG tablet Take 1 tablet (600 mg total) by mouth every 6 (six) hours as needed. 30 tablet 0    Multiple Vitamin (MULTIVITAMIN) tablet Take 1 tablet by mouth daily.     nitroGLYCERIN (NITROSTAT) 0.4 MG SL tablet Place 1 tablet (0.4 mg total) under the tongue every 5 (five) minutes as needed for chest pain (x3 total. call ems if chest pain does not resolve after one, call ems.). 25 tablet 0   sertraline (ZOLOFT) 100 MG tablet TAKE 1 & 1/2 TABLET BY MOUTH ONCE DAILY. 135 tablet 0   tiZANidine (ZANAFLEX) 4 MG tablet Take 1 tablet (4 mg total) by mouth every 8 (eight) hours as needed for muscle spasms. 10 tablet 0   No facility-administered medications prior to visit.    Allergies  Allergen Reactions   Demerol Anaphylaxis   Meperidine Anaphylaxis   Sulfamethoxazole Itching    With redness to skin    Citalopram Hydrobromide Other (See Comments)    Unknown   Hydrocodone    Hydrocodone-Acetaminophen Other (See Comments)    Unknown   Metoprolol Tartrate Other (See Comments)    Chest discomfort, palpations   Oxycodone-Acetaminophen Other (See Comments)    Unknown   Rosuvastatin Other (See Comments)    myalgias   Statins     myalgias   Diltiazem Rash   Metoprolol Tartrate Palpitations    Review of Systems  Constitutional:  Negative for chills, fatigue and fever.  HENT:  Positive for sinus pressure and sinus pain. Negative for congestion, ear pain, postnasal drip, rhinorrhea and sore throat.   Respiratory:  Negative for cough and shortness of breath.   Cardiovascular:  Positive for leg swelling (BLE). Negative for chest pain.  Gastrointestinal:  Negative for diarrhea, nausea and vomiting.  Genitourinary:  Positive for dysuria (little bit), frequency and urgency.  Musculoskeletal:  Positive for back pain (low back pain).  Neurological:  Negative for dizziness and headaches.       Objective:        05/01/2023    9:58 AM 04/20/2023    8:58 AM 12/17/2022    8:47 AM  Vitals  with BMI  Height 5\' 3"  5\' 3"  5\' 3"   Weight 201 lbs 201 lbs 205 lbs  BMI 35.61 35.61 36.32  Systolic  134 120 124  Diastolic 76 80 78  Pulse 93 74 78    No data found.   Physical Exam Constitutional:      General: She is not in acute distress.    Appearance: Normal appearance. She is ill-appearing.  HENT:     Head: Normocephalic.     Nose: Congestion present.  Cardiovascular:     Rate and Rhythm: Normal rate and regular rhythm.     Heart sounds: Normal heart sounds.  Pulmonary:     Effort: Pulmonary effort is normal.     Breath sounds: Examination of the right-lower field reveals decreased breath sounds and rhonchi. Examination of the left-lower field reveals decreased breath sounds and wheezing. Decreased breath sounds, wheezing and rhonchi present.  Abdominal:     General: Bowel sounds are normal.     Palpations: Abdomen is soft.     Tenderness: There is no abdominal tenderness.  Musculoskeletal:        General: Normal range of motion.     Cervical back: Normal range of motion.  Skin:    General: Skin is warm.  Neurological:     Mental Status: She is alert. Mental status is at baseline.  Psychiatric:        Mood and Affect: Mood normal.        Behavior: Behavior normal.     Health Maintenance Due  Topic Date Due   Zoster Vaccines- Shingrix (1 of 2) Never done   Cervical Cancer Screening (HPV/Pap Cotest)  12/14/2022    There are no preventive care reminders to display for this patient.   Lab Results  Component Value Date   TSH 2.450 07/31/2022   Lab Results  Component Value Date   WBC 5.2 04/20/2023   HGB 12.5 04/20/2023   HCT 37.4 04/20/2023   MCV 99 (H) 04/20/2023   PLT 375 04/20/2023   Lab Results  Component Value Date   NA 142 04/20/2023   K 5.2 04/20/2023   CO2 26 04/20/2023   GLUCOSE 100 (H) 04/20/2023   BUN 7 04/20/2023   CREATININE 0.63 04/20/2023   BILITOT 0.3 04/20/2023   ALKPHOS 123 (H) 04/20/2023   AST 22 04/20/2023   ALT 12 04/20/2023   PROT 6.5 04/20/2023   ALBUMIN 4.3 04/20/2023   CALCIUM 9.6 04/20/2023   ANIONGAP 10 12/15/2022    EGFR 103 04/20/2023   Lab Results  Component Value Date   CHOL 227 (H) 04/20/2023   Lab Results  Component Value Date   HDL 99 04/20/2023   Lab Results  Component Value Date   LDLCALC 115 (H) 04/20/2023   Lab Results  Component Value Date   TRIG 75 04/20/2023   Lab Results  Component Value Date   CHOLHDL 2.3 04/20/2023   Lab Results  Component Value Date   HGBA1C 5.0 11/23/2020       Assessment & Plan:  Mild intermittent asthma with acute exacerbation Assessment & Plan: Reports chest congestion and coughing up sputum, possibly discolored. History of pneumonia and bronchitis. Decreased breath sounds and wheezing in left lower lobe. Currently using Albuterol inhaler and nebulizer. -Administer DuoNeb in office today. -Consider Airsupra inhaler as an alternative to Albuterol. Provide sample for patient to try. -Perform Pulmonary Function Test (PFT) today.   Orders: -     CBC with Differential/Platelet -  Ipratropium-Albuterol -     Peak flow -     Airsupra; Inhale 1-2 puffs into the lungs every 6 (six) hours as needed.  Dispense: 5.9 g; Refill: 0  Urinary frequency Assessment & Plan: UTI - POC urine - negative -Culture sent -Check CBC -Drink plenty of fluids -Try AZO product with pyridium -Discontinue new soap -Remember to wipe from front to back and don't hold it in! If possible, empty your bladder every 4 hours.  -Call if the burning worsens, you develop a fever, back pain or pelvic pain.  Orders: -     POCT URINALYSIS DIP (CLINITEK) -     Urine Culture  Leg swelling Assessment & Plan: Acute Concern for possible kidney dysfunction. Last kidney function test on 04/20/23 showed normal GFR. -Order CMP to check current kidney function. -Await labs/testing for assessment and recommendations  -  Orders: -     Comprehensive metabolic panel     Meds ordered this encounter  Medications   ipratropium-albuterol (DUONEB) 0.5-2.5 (3) MG/3ML nebulizer  solution 3 mL   DISCONTD: Albuterol-Budesonide (AIRSUPRA) 90-80 MCG/ACT AERO    Sig: Inhale 1 puff into the lungs every 6 (six) hours as needed.   Albuterol-Budesonide (AIRSUPRA) 90-80 MCG/ACT AERO    Sig: Inhale 1-2 puffs into the lungs every 6 (six) hours as needed.    Dispense:  5.9 g    Refill:  0    Orders Placed This Encounter  Procedures   Urine Culture   CBC with Differential   Comprehensive metabolic panel   Peak flow   POCT URINALYSIS DIP (CLINITEK)     Follow-up: Return if symptoms worsen or fail to improve.  An After Visit Summary was printed and given to the patient.  Total time spent on today's visit was greater than 30 minutes, including both face-to-face time and nonface-to-face time personally spent on review of chart (labs and imaging), discussing labs and goals, discussing further work-up, treatment options, referrals to specialist if needed, reviewing outside records of pertinent, answering patient's questions, and coordinating care.   Lajuana Matte, FNP [facility] [phone]

## 2023-05-02 LAB — COMPREHENSIVE METABOLIC PANEL
ALT: 15 [IU]/L (ref 0–32)
AST: 21 [IU]/L (ref 0–40)
Albumin: 4.4 g/dL (ref 3.8–4.9)
Alkaline Phosphatase: 139 [IU]/L — ABNORMAL HIGH (ref 44–121)
BUN/Creatinine Ratio: 13 (ref 9–23)
BUN: 8 mg/dL (ref 6–24)
Bilirubin Total: 0.7 mg/dL (ref 0.0–1.2)
CO2: 23 mmol/L (ref 20–29)
Calcium: 10 mg/dL (ref 8.7–10.2)
Chloride: 99 mmol/L (ref 96–106)
Creatinine, Ser: 0.61 mg/dL (ref 0.57–1.00)
Globulin, Total: 2.7 g/dL (ref 1.5–4.5)
Glucose: 106 mg/dL — ABNORMAL HIGH (ref 70–99)
Potassium: 4.9 mmol/L (ref 3.5–5.2)
Sodium: 140 mmol/L (ref 134–144)
Total Protein: 7.1 g/dL (ref 6.0–8.5)
eGFR: 104 mL/min/{1.73_m2} (ref >59)

## 2023-05-02 LAB — CBC WITH DIFFERENTIAL/PLATELET
Basophils Absolute: 0.1 10*3/uL (ref 0.0–0.2)
Basos: 1 %
EOS (ABSOLUTE): 0.1 10*3/uL (ref 0.0–0.4)
Eos: 1 %
Hematocrit: 40.2 % (ref 34.0–46.6)
Hemoglobin: 13.6 g/dL (ref 11.1–15.9)
Immature Grans (Abs): 0 10*3/uL (ref 0.0–0.1)
Immature Granulocytes: 0 %
Lymphocytes Absolute: 1.3 10*3/uL (ref 0.7–3.1)
Lymphs: 19 %
MCH: 33.1 pg — ABNORMAL HIGH (ref 26.6–33.0)
MCHC: 33.8 g/dL (ref 31.5–35.7)
MCV: 98 fL — ABNORMAL HIGH (ref 79–97)
Monocytes Absolute: 0.6 10*3/uL (ref 0.1–0.9)
Monocytes: 9 %
Neutrophils Absolute: 4.6 10*3/uL (ref 1.4–7.0)
Neutrophils: 70 %
Platelets: 368 10*3/uL (ref 150–450)
RBC: 4.11 x10E6/uL (ref 3.77–5.28)
RDW: 11.9 % (ref 11.7–15.4)
WBC: 6.6 10*3/uL (ref 3.4–10.8)

## 2023-05-02 LAB — URINE CULTURE

## 2023-05-06 ENCOUNTER — Telehealth: Payer: Self-pay

## 2023-05-06 NOTE — Telephone Encounter (Signed)
PA submitted and approved via covermymeds for Airsupra.

## 2023-05-18 ENCOUNTER — Ambulatory Visit: Payer: Medicare HMO | Admitting: Family Medicine

## 2023-06-21 ENCOUNTER — Other Ambulatory Visit: Payer: Self-pay | Admitting: Family Medicine

## 2023-06-21 DIAGNOSIS — F411 Generalized anxiety disorder: Secondary | ICD-10-CM

## 2023-06-21 DIAGNOSIS — F331 Major depressive disorder, recurrent, moderate: Secondary | ICD-10-CM

## 2023-07-23 DIAGNOSIS — I2510 Atherosclerotic heart disease of native coronary artery without angina pectoris: Secondary | ICD-10-CM | POA: Diagnosis not present

## 2023-07-23 DIAGNOSIS — Z8249 Family history of ischemic heart disease and other diseases of the circulatory system: Secondary | ICD-10-CM | POA: Diagnosis not present

## 2023-07-23 DIAGNOSIS — F325 Major depressive disorder, single episode, in full remission: Secondary | ICD-10-CM | POA: Diagnosis not present

## 2023-07-23 DIAGNOSIS — Z87891 Personal history of nicotine dependence: Secondary | ICD-10-CM | POA: Diagnosis not present

## 2023-07-23 DIAGNOSIS — I700 Atherosclerosis of aorta: Secondary | ICD-10-CM | POA: Diagnosis not present

## 2023-07-23 DIAGNOSIS — K219 Gastro-esophageal reflux disease without esophagitis: Secondary | ICD-10-CM | POA: Diagnosis not present

## 2023-07-23 DIAGNOSIS — M1990 Unspecified osteoarthritis, unspecified site: Secondary | ICD-10-CM | POA: Diagnosis not present

## 2023-07-23 DIAGNOSIS — I10 Essential (primary) hypertension: Secondary | ICD-10-CM | POA: Diagnosis not present

## 2023-07-23 DIAGNOSIS — Z008 Encounter for other general examination: Secondary | ICD-10-CM | POA: Diagnosis not present

## 2023-07-23 DIAGNOSIS — E785 Hyperlipidemia, unspecified: Secondary | ICD-10-CM | POA: Diagnosis not present

## 2023-07-23 DIAGNOSIS — J4489 Other specified chronic obstructive pulmonary disease: Secondary | ICD-10-CM | POA: Diagnosis not present

## 2023-07-23 DIAGNOSIS — I252 Old myocardial infarction: Secondary | ICD-10-CM | POA: Diagnosis not present

## 2023-07-23 LAB — FECAL OCCULT BLOOD, IMMUNOCHEMICAL: IFOBT: NEGATIVE

## 2023-07-27 NOTE — Progress Notes (Signed)
Subjective:  Patient ID: [name], female    DOB: 20-Jan-1966  Age: 58 y.o. MRN: [id number]  Chief Complaint  Patient presents with   Medical Management of Chronic Issues    HPI   [name] is a 58 year old Caucasian female that presents for follow-up hyperlipidemia, hypertension, GERD, and anxiety with depression.   The patient, with a history of lung nodules, knee and hip replacements, and gout, presents with a feeling of fullness in her ears. She has been managing this with swimmer's ear drops and attributes the sensation to her long hair. She also mentions a flare-up of gout in her thumb, which she believes is triggered by eating crab legs. She has stopped taking her blood pressure medication, as she felt it was lowering her blood pressure too much and has been monitoring her blood pressure with a Bluetooth watch. She is also on Zoloft for mental health management and uses an albuterol inhaler infrequently. She is interested in a low dose CT scan for her lung nodules and requires amoxicillin before dental cleanings due to her knee and hip replacements.  Lipid/Cholesterol, Follow-up She was last seen for this 4 months ago.  Management since that visit includes Zetia. Intolerant to crestor and lipitor.     She is eating healthy. Not exercising.   Hypertension, follow-up: Management since that visit includes amlodipine 10 mg daily  She has taken 3 times in past 3 months)     She is following a Regular diet. She is not exercising. She does not smoke.  Anxiety/Depression, Follow-up She was last seen for anxiety 4 months ago. Currently on zoloft 150 mg daily. Never started wellbutrin xl.   GERD, Follow up: The patient was last seen for GERD. On no medicine. She is NOT experiencing chest pain, cough, or difficulty swallowing.  Asthma: using albuterol hfa infrequently.        04/20/2023    9:02 AM 12/17/2022    8:52 AM 07/29/2022    3:22 PM 07/01/2022    4:25 PM 04/26/2022    5:22  PM  Depression screen PHQ 2/9  Decreased Interest 1 0 0 0 0  Down, Depressed, Hopeless 1 1 0 0 0  PHQ - 2 Score 2 1 0 0 0  Altered sleeping 1 1 0 0 0  Tired, decreased energy 1 1 0 0 0  Change in appetite 0 0 0 0 0  Feeling bad or failure about yourself  0 0 0 0 0  Trouble concentrating 2 0 0 0 0  Moving slowly or fidgety/restless 3 1 0 0 0  Suicidal thoughts 0 0 0 0 0  PHQ-9 Score 9 4 0 0 0  Difficult doing work/chores Somewhat difficult Not difficult at all Not difficult at all Not difficult at all Not difficult at all        07/28/2023    9:23 AM  Fall Risk   Falls in the past year? 1  Number falls in past yr: 0  Injury with Fall? 0  Risk for fall due to : No Fall Risks  Follow up Falls evaluation completed    Patient Care Team: Blane Ohara, MD as PCP - General (Family Medicine) Georgeanna Lea, MD as Consulting Physician (Cardiology)   Review of Systems  Constitutional:  Negative for chills, fatigue and fever.  HENT:  Positive for ear pain (ear fullness). Negative for congestion and sore throat.   Respiratory:  Negative for cough and shortness of breath.   Cardiovascular:  Negative for chest pain.  Gastrointestinal:  Negative for abdominal pain, constipation, diarrhea, nausea and vomiting.  Genitourinary:  Negative for dysuria and urgency.  Musculoskeletal:  Negative for arthralgias and myalgias.  Skin:  Negative for rash.  Neurological:  Negative for dizziness and headaches.  Psychiatric/Behavioral:  Negative for dysphoric mood. The patient is not nervous/anxious.     Current Outpatient Medications on File Prior to Visit  Medication Sig Dispense Refill   acidophilus (RISAQUAD) CAPS capsule Take 1 capsule by mouth daily.     albuterol (VENTOLIN HFA) 108 (90 Base) MCG/ACT inhaler Inhale 1-2 puffs into the lungs every 6 (six) hours as needed for wheezing or shortness of breath.     amLODipine (NORVASC) 10 MG tablet Take 1 tablet (10 mg total) by mouth daily. 90  tablet 1   aspirin EC 81 MG tablet Take 81 mg by mouth daily. Swallow whole.     EPINEPHrine 0.3 mg/0.3 mL IJ SOAJ injection Inject 0.3 mg into the muscle as needed for anaphylaxis. 2 each 1   fluticasone (FLONASE) 50 MCG/ACT nasal spray Place 1 spray into both nostrils daily.     Fluticasone-Umeclidin-Vilant (TRELEGY ELLIPTA) 200-62.5-25 MCG/ACT AEPB Inhale 1 Inhalation into the lungs daily. 1 each 11   furosemide (LASIX) 20 MG tablet Take 1 tablet (20 mg total) by mouth as needed for fluid or edema. 90 tablet 0   ibuprofen (ADVIL) 600 MG tablet Take 1 tablet (600 mg total) by mouth every 6 (six) hours as needed. 30 tablet 0   Multiple Vitamin (MULTIVITAMIN) tablet Take 1 tablet by mouth daily.     nitroGLYCERIN (NITROSTAT) 0.4 MG SL tablet Place 1 tablet (0.4 mg total) under the tongue every 5 (five) minutes as needed for chest pain (x3 total. call ems if chest pain does not resolve after one, call ems.). 25 tablet 0   sertraline (ZOLOFT) 100 MG tablet TAKE 1 & 1/2 (ONE & ONE-HALF) TABLETS BY MOUTH ONCE DAILY 135 tablet 0   tiZANidine (ZANAFLEX) 4 MG tablet Take 1 tablet (4 mg total) by mouth every 8 (eight) hours as needed for muscle spasms. 10 tablet 0   Current Facility-Administered Medications on File Prior to Visit  Medication Dose Route Frequency Provider Last Rate Last Admin   ipratropium-albuterol (DUONEB) 0.5-2.5 (3) MG/3ML nebulizer solution 3 mL  3 mL Nebulization Once        Past Medical History:  Diagnosis Date   Allergic reaction to bee sting 09/09/2018   Anxiety    Asthma    Bacterial vaginosis 07/07/2019   Bulging lumbar disc 07/22/2015   Cardiomyopathy (HCC) 07/18/2015   Overview:  Overview:  Taka T[place] 2009, normal coronaries  Last Assessment & Plan:  Relevant Hx: Course: Daily Update: Today's Plan: she has recently been seen by cards and is planned to have echo this Friday  Electronically signed by: Jenelle Mages, FNP 07/24/15 1042   Carpal tunnel syndrome 07/22/2015    Cervical strain 01/11/2019   Chronic fatigue 07/22/2015   Chronic rhinitis 07/22/2015   Depression    Dyslipidemia 07/18/2015   Esophageal stricture 07/22/2015   Essential hypertension    Eustachian tube dysfunction 07/22/2015   Excessive daytime sleepiness 05/16/2019   Gastro-esophageal reflux disease without esophagitis    Gastroesophageal reflux disease without esophagitis 07/18/2015   Last Assessment & Plan:  Relevant Hx: Course: Daily Update: Today's Plan:appears stable with ppi  Electronically signed by: Jenelle Mages, FNP 07/24/15 1043   High risk medication use 07/22/2015  History of blood clots    Hot flash, menopausal 07/22/2015   Hyperlipidemia 07/22/2015   Last Assessment & Plan:  Relevant Hx: Course: Daily Update: Today's Plan:stable with meds  Electronically signed by: Jenelle Mages, FNP 07/24/15 1112   Hypertension    Localized swelling of both lower legs 07/22/2015   Localized swelling of both lower legs 07/22/2015   Low back strain, initial encounter 07/07/2019   Lung mass    Mixed hyperlipidemia 07/22/2015   Last Assessment & Plan:  Formatting of this note might be different from the original. Relevant Hx: Course: Daily Update: Today's Plan:stable with meds  Electronically signed by: Jenelle Mages, FNP 07/24/15 1112   Morbid obesity (HCC) 07/22/2015   Other insomnia 11/22/2018   Pneumonia due to COVID-19 virus 01/17/2020   Pulmonary embolus (HCC) 01/18/2020   Screening for osteoporosis 02/27/2017   Formatting of this note might be different from the original. 2019: spine 3.1, L femur 1.3   Seasonal allergic rhinitis due to pollen 07/22/2015   Snores 07/22/2015   Last Assessment & Plan:  Relevant Hx: Course: Daily Update: Today's Plan:she was to have psg, but has not had this yet. Discussed that if this is present could be worse post-op with pain meds. None the less, she will have protective airway with/during surgery. She should still have psg study, but wants to wait  until after knee repair  Electronically signed by: Jenelle Mages, FNP 07/24/15   Past Surgical History:  Procedure Laterality Date   ABDOMINAL HYSTERECTOMY     CARPAL TUNNEL RELEASE Right    CHOLECYSTECTOMY     ECTOPIC PREGNANCY SURGERY     X 2   FOOT SURGERY     Knee replcement     POLYPECTOMY     TONSILECTOMY/ADENOIDECTOMY WITH MYRINGOTOMY  1972   TOTAL HIP ARTHROPLASTY Right    WISDOM TOOTH EXTRACTION      Family History  Problem Relation Age of Onset   Dementia Mother    Heart attack Father    Heart disease Father    Leukemia Half-Brother    Multiple myeloma Half-Brother    Diabetes Half-Sister    Breast cancer Neg Hx    Social History   Socioeconomic History   Marital status: Divorced    Spouse name: NONE   Number of children: 0   Years of education: 12 +  SOME [employer]   Highest education level: Some college, no degree  Occupational History   Occupation: DIABLED  Tobacco Use   Smoking status: Former    Current packs/day: 0.00    Types: Cigarettes    Quit date: 06/17/2015    Years since quitting: 8.1   Smokeless tobacco: Never  Vaping Use   Vaping status: Never Used  Substance and Sexual Activity   Alcohol use: Yes    Alcohol/week: 3.0 - 4.0 standard drinks of alcohol    Types: 3 - 4 Cans of beer per week   Drug use: No   Sexual activity: Yes  Other Topics Concern   Not on file  Social History Narrative   Not on file   Social Drivers of Health   Financial Resource Strain: High Risk (07/27/2023)   Overall Financial Resource Strain (CARDIA)    Difficulty of Paying Living Expenses: Hard  Food Insecurity: No Food Insecurity (07/27/2023)   Hunger Vital Sign    Worried About Running Out of Food in the Last Year: Never true    Ran Out of Food in  the Last Year: Never true  Transportation Needs: No Transportation Needs (07/27/2023)   PRAPARE - Administrator, Civil Service (Medical): No    Lack of Transportation (Non-Medical): No   Physical Activity: Sufficiently Active (07/27/2023)   Exercise Vital Sign    Days of Exercise per Week: 3 days    Minutes of Exercise per Session: 60 min  Stress: Stress Concern Present (07/27/2023)   [employer] of Occupational Health - Occupational Stress Questionnaire    Feeling of Stress : Rather much  Social Connections: Moderately Isolated (07/27/2023)   Social Connection and Isolation Panel [NHANES]    Frequency of Communication with Friends and Family: More than three times a week    Frequency of Social Gatherings with Friends and Family: Once a week    Attends Religious Services: 1 to 4 times per year    Active Member of [employer] or Organizations: No    Attends Engineer, structural: Never    Marital Status: Divorced    Objective:  BP 122/68   Pulse 68   Temp 97.8 F (36.6 C)   Ht 5\' 3"  (1.6 m)   Wt 198 lb (89.8 kg)   LMP  (LMP Unknown)   SpO2 98%   BMI 35.07 kg/m      07/28/2023    9:20 AM 05/01/2023    9:58 AM 04/20/2023    8:58 AM  BP/Weight  Systolic BP 122 134 120  Diastolic BP 68 76 80  Wt. (Lbs) 198 201 201  BMI 35.07 kg/m2 35.61 kg/m2 35.61 kg/m2    Physical Exam Vitals reviewed.  Constitutional:      Appearance: Normal appearance. She is obese.  HENT:     Right Ear: Tympanic membrane normal.     Left Ear: Tympanic membrane normal.     Nose: Nose normal.     Mouth/Throat:     Pharynx: No oropharyngeal exudate or posterior oropharyngeal erythema.  Neck:     Vascular: No carotid bruit.  Cardiovascular:     Rate and Rhythm: Normal rate and regular rhythm.     Heart sounds: Normal heart sounds.  Pulmonary:     Effort: Pulmonary effort is normal. No respiratory distress.     Breath sounds: Normal breath sounds.  Abdominal:     General: Abdomen is flat. Bowel sounds are normal.     Palpations: Abdomen is soft.     Tenderness: There is no abdominal tenderness.  Lymphadenopathy:     Cervical: No cervical adenopathy.  Neurological:      Mental Status: She is alert and oriented to person, place, and time.  Psychiatric:        Mood and Affect: Mood normal.        Behavior: Behavior normal.     Diabetic Foot Exam - Simple   No data filed      Lab Results  Component Value Date   WBC 6.4 07/28/2023   HGB 14.2 07/28/2023   HCT 42.7 07/28/2023   PLT 374 07/28/2023   GLUCOSE 94 07/28/2023   CHOL 267 (H) 07/28/2023   TRIG 97 07/28/2023   HDL 109 07/28/2023   LDLCALC 142 (H) 07/28/2023   ALT 14 07/28/2023   AST 22 07/28/2023   NA 141 07/28/2023   K 5.5 (H) 07/28/2023   CL 100 07/28/2023   CREATININE 0.66 07/28/2023   BUN 6 07/28/2023   CO2 26 07/28/2023   TSH 2.230 07/28/2023   HGBA1C 5.0 11/23/2020  Assessment & Plan:    Essential hypertension Assessment & Plan: Patient has stopped taking antihypertensive medications due to low blood pressure readings at home. Patient is monitoring blood pressure at home. -Continue home blood pressure monitoring.  Orders: -     CBC with Differential/Platelet -     Comprehensive metabolic panel  Gastroesophageal reflux disease without esophagitis Assessment & Plan: Stable without medication.   Mixed hyperlipidemia Assessment & Plan: Well controlled. Continue zetia 10 mg daily. Continue to work on eating a healthy diet and exercise.  Labs drawn today.  Intolerant to statins.   Orders: -     Lipid panel -     TSH  GAD (generalized anxiety disorder) Assessment & Plan: The current medical regimen is effective;  continue present plan and medications.  Continue on zoloft 150 mg daily.   Mild intermittent asthma with acute exacerbation Assessment & Plan: Infrequent use of Albuterol inhaler. Not currently using Trelegy. Discussed cost of Airsupra inhaler on last insurance.. -Resend prescription for Airsupra inhaler.  Orders: -     Airsupra; Inhale 1-2 puffs into the lungs every 6 (six) hours as needed.  Dispense: 10.7 g; Refill: 3  History of tobacco  use Assessment & Plan: Order CT scan of lung   Screening for lung cancer -     CT CHEST LUNG CANCER SCREENING LOW DOSE WO CONTRAST  Idiopathic chronic gout without tophus, unspecified site Assessment & Plan: Self-reported gout flare in thumb joint. No current acute flare. -Advise patient to present for evaluation during acute flare for confirmation of diagnosis.   Prophylactic antibiotic Assessment & Plan: History of knee and hip replacements. Requires prophylactic antibiotics prior to dental procedures. -Prescribe Amoxicillin 2000mg  (4 x 500mg  capsules) to be taken 1 hour prior to dental procedures.   Lung nodules Assessment & Plan: History of nodules found on previous imaging. Patient is a smoker. Discussed low-dose CT scan for lung cancer screening. -Order low-dose CT scan for lung cancer screening.   Sensation of fullness in both ears Assessment & Plan: No pain or hearing loss. Likely secondary to water exposure from hair washing. Using swimmer's ear drops. -Continue current management.   Other orders -     Amoxicillin; 4 capsules one hour prior to dental work.  Dispense: 20 capsule; Refill: 0   Meds ordered this encounter  Medications   amoxicillin (AMOXIL) 500 MG capsule    Sig: 4 capsules one hour prior to dental work.    Dispense:  20 capsule    Refill:  0   Albuterol-Budesonide (AIRSUPRA) 90-80 MCG/ACT AERO    Sig: Inhale 1-2 puffs into the lungs every 6 (six) hours as needed.    Dispense:  10.7 g    Refill:  3    Orders Placed This Encounter  Procedures   CT CHEST LUNG CANCER SCREENING LOW DOSE WO CONTRAST   CBC with Differential/Platelet   Comprehensive metabolic panel   Lipid panel   TSH     Follow-up: Return in about 3 months (around 10/25/2023) for chronic follow up, awv with KIM. Clayborn Bigness I Leal-Borjas,acting as a scribe for Blane Ohara, MD.,have documented all relevant documentation on the behalf of Blane Ohara, MD,as directed by  Blane Ohara, MD while in the presence of Blane Ohara, MD.   An After Visit Summary was printed and given to the patient.  I attest that I have reviewed this visit and agree with the plan scribed by my staff.   Blane Ohara,  MD [name] [phone]

## 2023-07-28 ENCOUNTER — Encounter: Payer: Self-pay | Admitting: Family Medicine

## 2023-07-28 ENCOUNTER — Ambulatory Visit (INDEPENDENT_AMBULATORY_CARE_PROVIDER_SITE_OTHER): Payer: Medicare HMO | Admitting: Family Medicine

## 2023-07-28 VITALS — BP 122/68 | HR 68 | Temp 97.8°F | Ht 63.0 in | Wt 198.0 lb

## 2023-07-28 DIAGNOSIS — M1A00X0 Idiopathic chronic gout, unspecified site, without tophus (tophi): Secondary | ICD-10-CM

## 2023-07-28 DIAGNOSIS — F411 Generalized anxiety disorder: Secondary | ICD-10-CM

## 2023-07-28 DIAGNOSIS — K219 Gastro-esophageal reflux disease without esophagitis: Secondary | ICD-10-CM

## 2023-07-28 DIAGNOSIS — Z87891 Personal history of nicotine dependence: Secondary | ICD-10-CM

## 2023-07-28 DIAGNOSIS — H938X3 Other specified disorders of ear, bilateral: Secondary | ICD-10-CM

## 2023-07-28 DIAGNOSIS — E782 Mixed hyperlipidemia: Secondary | ICD-10-CM | POA: Diagnosis not present

## 2023-07-28 DIAGNOSIS — J4521 Mild intermittent asthma with (acute) exacerbation: Secondary | ICD-10-CM

## 2023-07-28 DIAGNOSIS — Z792 Long term (current) use of antibiotics: Secondary | ICD-10-CM

## 2023-07-28 DIAGNOSIS — I10 Essential (primary) hypertension: Secondary | ICD-10-CM | POA: Diagnosis not present

## 2023-07-28 DIAGNOSIS — Z122 Encounter for screening for malignant neoplasm of respiratory organs: Secondary | ICD-10-CM

## 2023-07-28 DIAGNOSIS — R918 Other nonspecific abnormal finding of lung field: Secondary | ICD-10-CM

## 2023-07-28 MED ORDER — AMOXICILLIN 500 MG PO CAPS
ORAL_CAPSULE | ORAL | 0 refills | Status: DC
Start: 2023-07-28 — End: 2023-10-28

## 2023-07-28 MED ORDER — AIRSUPRA 90-80 MCG/ACT IN AERO
1.0000 | INHALATION_SPRAY | Freq: Four times a day (QID) | RESPIRATORY_TRACT | 3 refills | Status: AC | PRN
Start: 2023-07-28 — End: ?

## 2023-07-28 NOTE — Patient Instructions (Signed)
VISIT SUMMARY:  During today's visit, we addressed several of your health concerns, including the fullness in your ears, lung nodules, need for prophylactic antibiotics before dental procedures, blood pressure management, asthma/COPD, and gout. We also discussed general health maintenance and scheduled necessary follow-ups.  YOUR PLAN:  -EAR FULLNESS: The feeling of fullness in your ears is likely due to water exposure from hair washing. Continue using the swimmer's ear drops as you have been doing.  -LUNG NODULES: Lung nodules are small masses of tissue in the lungs that can be benign or malignant. Given your history and smoking status, we will order a low-dose CT scan to screen for lung cancer.  -PROPHYLACTIC ANTIBIOTICS FOR DENTAL PROCEDURES: Due to your knee and hip replacements, you need to take antibiotics before dental procedures to prevent infections. We have prescribed Amoxicillin 2000mg  (4 x 500mg  capsules) to be taken 1 hour before your dental appointments.  -HYPERTENSION: Hypertension, or high blood pressure, can lead to serious health issues if not managed properly. Since you have stopped your medication due to low readings, continue to monitor your blood pressure at home.  -ASTHMA/COPD: Asthma and COPD are conditions that affect your breathing. You are using your Albuterol inhaler infrequently and not currently using Trelegy. We discussed the cost of the Airsupra  inhaler and will resend the prescription for it to replace albuterol inhalers.  -GENERAL HEALTH MAINTENANCE: We will order blood work and administer the Pneumonia vaccine today. Additionally, we will schedule your Medicare Annual Wellness Visit.  INSTRUCTIONS:  1. Continue using swimmer's ear drops for ear fullness. 2. Complete the low-dose CT scan for lung cancer screening as ordered. 3. Take Amoxicillin 2000mg  (4 x 500mg  capsules) 1 hour before dental procedures. 4. Continue monitoring your blood pressure at home. 5.  Use the Airsupra  inhaler as prescribed once you receive it. 6. Present for evaluation during an acute gout flare. 7. Complete the blood work/ 8. Schedule Medicare Annual Wellness Visit.

## 2023-07-29 ENCOUNTER — Encounter: Payer: Self-pay | Admitting: Family Medicine

## 2023-07-29 ENCOUNTER — Other Ambulatory Visit: Payer: Self-pay

## 2023-07-29 LAB — CBC WITH DIFFERENTIAL/PLATELET
Basophils Absolute: 0.1 10*3/uL (ref 0.0–0.2)
Basos: 1 %
EOS (ABSOLUTE): 0.1 10*3/uL (ref 0.0–0.4)
Eos: 2 %
Hematocrit: 42.7 % (ref 34.0–46.6)
Hemoglobin: 14.2 g/dL (ref 11.1–15.9)
Immature Grans (Abs): 0 10*3/uL (ref 0.0–0.1)
Immature Granulocytes: 0 %
Lymphocytes Absolute: 1.8 10*3/uL (ref 0.7–3.1)
Lymphs: 27 %
MCH: 31.9 pg (ref 26.6–33.0)
MCHC: 33.3 g/dL (ref 31.5–35.7)
MCV: 96 fL (ref 79–97)
Monocytes Absolute: 0.6 10*3/uL (ref 0.1–0.9)
Monocytes: 9 %
Neutrophils Absolute: 3.9 10*3/uL (ref 1.4–7.0)
Neutrophils: 61 %
Platelets: 374 10*3/uL (ref 150–450)
RBC: 4.45 x10E6/uL (ref 3.77–5.28)
RDW: 12.3 % (ref 11.7–15.4)
WBC: 6.4 10*3/uL (ref 3.4–10.8)

## 2023-07-29 LAB — COMPREHENSIVE METABOLIC PANEL
ALT: 14 [IU]/L (ref 0–32)
AST: 22 [IU]/L (ref 0–40)
Albumin: 4.3 g/dL (ref 3.8–4.9)
Alkaline Phosphatase: 119 [IU]/L (ref 44–121)
BUN/Creatinine Ratio: 9 (ref 9–23)
BUN: 6 mg/dL (ref 6–24)
Bilirubin Total: 0.4 mg/dL (ref 0.0–1.2)
CO2: 26 mmol/L (ref 20–29)
Calcium: 9.8 mg/dL (ref 8.7–10.2)
Chloride: 100 mmol/L (ref 96–106)
Creatinine, Ser: 0.66 mg/dL (ref 0.57–1.00)
Globulin, Total: 2.3 g/dL (ref 1.5–4.5)
Glucose: 94 mg/dL (ref 70–99)
Potassium: 5.5 mmol/L — ABNORMAL HIGH (ref 3.5–5.2)
Sodium: 141 mmol/L (ref 134–144)
Total Protein: 6.6 g/dL (ref 6.0–8.5)
eGFR: 102 mL/min/{1.73_m2} (ref >59)

## 2023-07-29 LAB — LIPID PANEL
Chol/HDL Ratio: 2.4 {ratio} (ref 0.0–4.4)
Cholesterol, Total: 267 mg/dL — ABNORMAL HIGH (ref 100–199)
HDL: 109 mg/dL (ref >39)
LDL Chol Calc (NIH): 142 mg/dL — ABNORMAL HIGH (ref 0–99)
Triglycerides: 97 mg/dL (ref 0–149)
VLDL Cholesterol Cal: 16 mg/dL (ref 5–40)

## 2023-07-29 LAB — TSH: TSH: 2.23 u[IU]/mL (ref 0.450–4.500)

## 2023-07-29 MED ORDER — NEXLIZET 180-10 MG PO TABS: ORAL_TABLET | ORAL | 0 refills | Status: DC

## 2023-07-29 NOTE — Telephone Encounter (Signed)
Copied from CRM [id number]. Topic: Clinical - Lab/Test Results >> Jul 29, 2023 11:00 AM Fonda Kinder J wrote: Reason for CRM: Pt returned a call regarding labs, I relayed the message from Dr.Cox and the pt has agreed to try the nexlitol samples. She states he has enough Zetia and wants to know when she can pick up the samples or if they will be sent to he rlocal pharmacy?

## 2023-07-29 NOTE — Telephone Encounter (Signed)
Patient returned call stating she is willing to try nexlitol, ans she almost out of zetia. Samples of nexlizet placed in closet for patient to pick up.

## 2023-08-01 DIAGNOSIS — Z87891 Personal history of nicotine dependence: Secondary | ICD-10-CM | POA: Insufficient documentation

## 2023-08-01 DIAGNOSIS — M1A00X0 Idiopathic chronic gout, unspecified site, without tophus (tophi): Secondary | ICD-10-CM | POA: Insufficient documentation

## 2023-08-01 DIAGNOSIS — Z122 Encounter for screening for malignant neoplasm of respiratory organs: Secondary | ICD-10-CM

## 2023-08-01 DIAGNOSIS — Z792 Long term (current) use of antibiotics: Secondary | ICD-10-CM

## 2023-08-01 DIAGNOSIS — H938X3 Other specified disorders of ear, bilateral: Secondary | ICD-10-CM

## 2023-08-01 HISTORY — DX: Other specified disorders of ear, bilateral: H93.8X3

## 2023-08-01 HISTORY — DX: Long term (current) use of antibiotics: Z79.2

## 2023-08-01 HISTORY — DX: Encounter for screening for malignant neoplasm of respiratory organs: Z12.2

## 2023-08-01 NOTE — Assessment & Plan Note (Signed)
History of nodules found on previous imaging. Patient is a smoker. Discussed low-dose CT scan for lung cancer screening. -Order low-dose CT scan for lung cancer screening.

## 2023-08-01 NOTE — Assessment & Plan Note (Signed)
Order CT scan of lung

## 2023-08-01 NOTE — Assessment & Plan Note (Signed)
History of knee and hip replacements. Requires prophylactic antibiotics prior to dental procedures. -Prescribe Amoxicillin 2000mg  (4 x 500mg  capsules) to be taken 1 hour prior to dental procedures.

## 2023-08-01 NOTE — Assessment & Plan Note (Signed)
No pain or hearing loss. Likely secondary to water exposure from hair washing. Using swimmer's ear drops. -Continue current management.

## 2023-08-01 NOTE — Assessment & Plan Note (Addendum)
Infrequent use of Albuterol inhaler. Not currently using Trelegy. Discussed cost of Airsupra inhaler on last insurance.. -Resend prescription for Airsupra inhaler.

## 2023-08-01 NOTE — Assessment & Plan Note (Signed)
Self-reported gout flare in thumb joint. No current acute flare. -Advise patient to present for evaluation during acute flare for confirmation of diagnosis.

## 2023-08-01 NOTE — Assessment & Plan Note (Signed)
 Stable without medication

## 2023-08-01 NOTE — Assessment & Plan Note (Signed)
Patient has stopped taking antihypertensive medications due to low blood pressure readings at home. Patient is monitoring blood pressure at home. -Continue home blood pressure monitoring.

## 2023-08-01 NOTE — Assessment & Plan Note (Signed)
Well controlled. Continue zetia 10 mg daily. Continue to work on eating a healthy diet and exercise.  Labs drawn today.  Intolerant to statins.

## 2023-08-01 NOTE — Assessment & Plan Note (Addendum)
The current medical regimen is effective;  continue present plan and medications.  Continue on zoloft 150 mg daily.

## 2023-08-01 NOTE — Assessment & Plan Note (Signed)
>>  ASSESSMENT AND PLAN FOR ESSENTIAL HYPERTENSION WRITTEN ON 08/01/2023  4:08 PM BY LEAL-BORJAS, Elbie Statzer I, CMA  Patient has stopped taking antihypertensive medications due to low blood pressure readings at home. Patient is monitoring blood pressure at home. -Continue home blood pressure monitoring.

## 2023-08-04 ENCOUNTER — Ambulatory Visit (HOSPITAL_BASED_OUTPATIENT_CLINIC_OR_DEPARTMENT_OTHER)
Admission: RE | Admit: 2023-08-04 | Discharge: 2023-08-04 | Disposition: A | Discharge status: Home / Self Care | Payer: Medicare HMO | Source: Ambulatory Visit | Attending: Family Medicine | Admitting: Family Medicine

## 2023-08-04 DIAGNOSIS — Z122 Encounter for screening for malignant neoplasm of respiratory organs: Secondary | ICD-10-CM

## 2023-08-04 DIAGNOSIS — F17210 Nicotine dependence, cigarettes, uncomplicated: Secondary | ICD-10-CM | POA: Diagnosis not present

## 2023-08-11 ENCOUNTER — Encounter: Payer: Self-pay | Admitting: Family Medicine

## 2023-08-13 ENCOUNTER — Ambulatory Visit: Payer: Medicare HMO

## 2023-08-13 VITALS — Ht 63.0 in | Wt 198.0 lb

## 2023-08-13 DIAGNOSIS — Z0000 Encounter for general adult medical examination without abnormal findings: Secondary | ICD-10-CM

## 2023-08-13 NOTE — Progress Notes (Addendum)
 Subjective:   [name] is a 58 y.o. who presents for a Medicare Wellness preventive visit.  Visit Complete: Virtual I connected with  [name] on 08/13/23 by a audio enabled telemedicine application and verified that I am speaking with the correct person using two identifiers.  Patient Location: Home  Provider Location: Office/Clinic  I discussed the limitations of evaluation and management by telemedicine. The patient expressed understanding and agreed to proceed.  Vital Signs: Because this visit was a virtual/telehealth visit, some criteria may be missing or patient reported. Any vitals not documented were not able to be obtained and vitals that have been documented are patient reported.  VideoDeclined- This patient declined Librarian, academic. Therefore the visit was completed with audio only.  AWV Questionnaire: Yes: Patient Medicare AWV questionnaire was completed by the patient on 08/13/2023; I have confirmed that all information answered by patient is correct and no changes since this date.  Cardiac Risk Factors include: dyslipidemia;family history of premature cardiovascular disease;hypertension;obesity (BMI >30kg/m2)     Objective:    Today's Vitals   08/13/23 1045  Weight: 198 lb (89.8 kg)  Height: 5\' 3"  (1.6 m)  PainSc: 3   PainLoc: Hand   Body mass index is 35.07 kg/m.     08/13/2023   10:48 AM 12/15/2022   12:22 PM 02/16/2021    9:04 AM 06/07/2020   11:39 AM  Advanced Directives  Does Patient Have a Medical Advance Directive? No No No No  Would patient like information on creating a medical advance directive? No - Patient declined  Yes (ED - Information included in AVS) No - Patient declined    Current Medications (verified) Outpatient Encounter Medications as of 08/13/2023  Medication Sig   acidophilus (RISAQUAD) CAPS capsule Take 1 capsule by mouth daily.   albuterol (VENTOLIN HFA) 108 (90 Base) MCG/ACT inhaler Inhale  1-2 puffs into the lungs every 6 (six) hours as needed for wheezing or shortness of breath.   Albuterol-Budesonide (AIRSUPRA) 90-80 MCG/ACT AERO Inhale 1-2 puffs into the lungs every 6 (six) hours as needed. (Patient not taking: Reported on 08/13/2023)   amLODipine (NORVASC) 10 MG tablet Take 1 tablet (10 mg total) by mouth daily.   amoxicillin (AMOXIL) 500 MG capsule 4 capsules one hour prior to dental work.   aspirin EC 81 MG tablet Take 81 mg by mouth daily. Swallow whole.   Bempedoic Acid-Ezetimibe (NEXLIZET) 180-10 MG TABS Take one tablet by mouth daily. (Patient not taking: Reported on 08/13/2023)   EPINEPHrine 0.3 mg/0.3 mL IJ SOAJ injection Inject 0.3 mg into the muscle as needed for anaphylaxis.   fluticasone (FLONASE) 50 MCG/ACT nasal spray Place 1 spray into both nostrils daily.   Fluticasone-Umeclidin-Vilant (TRELEGY ELLIPTA) 200-62.5-25 MCG/ACT AEPB Inhale 1 Inhalation into the lungs daily.   furosemide (LASIX) 20 MG tablet Take 1 tablet (20 mg total) by mouth as needed for fluid or edema.   ibuprofen (ADVIL) 600 MG tablet Take 1 tablet (600 mg total) by mouth every 6 (six) hours as needed.   Multiple Vitamin (MULTIVITAMIN) tablet Take 1 tablet by mouth daily.   nitroGLYCERIN (NITROSTAT) 0.4 MG SL tablet Place 1 tablet (0.4 mg total) under the tongue every 5 (five) minutes as needed for chest pain (x3 total. call ems if chest pain does not resolve after one, call ems.).   sertraline (ZOLOFT) 100 MG tablet TAKE 1 & 1/2 (ONE & ONE-HALF) TABLETS BY MOUTH ONCE DAILY   tiZANidine (ZANAFLEX) 4  MG tablet Take 1 tablet (4 mg total) by mouth every 8 (eight) hours as needed for muscle spasms.   Facility-Administered Encounter Medications as of 08/13/2023  Medication   ipratropium-albuterol (DUONEB) 0.5-2.5 (3) MG/3ML nebulizer solution 3 mL    Allergies (verified) Demerol, Meperidine, Sulfamethoxazole, Citalopram hydrobromide, Hydrocodone, Hydrocodone-acetaminophen, Metoprolol tartrate,  Oxycodone-acetaminophen, Rosuvastatin, Statins, Diltiazem, and Metoprolol tartrate   History: Past Medical History:  Diagnosis Date   Allergic reaction to bee sting 09/09/2018   Anxiety    Asthma    Bacterial vaginosis 07/07/2019   Bulging lumbar disc 07/22/2015   Cardiomyopathy (HCC) 07/18/2015   Overview:  Overview:  Taka Tsubo 2009, normal coronaries  Last Assessment & Plan:  Relevant Hx: Course: Daily Update: Today's Plan: she has recently been seen by cards and is planned to have echo this Friday  Electronically signed by: Jenelle Mages, FNP 07/24/15 1042   Carpal tunnel syndrome 07/22/2015   Cervical strain 01/11/2019   Chronic fatigue 07/22/2015   Chronic rhinitis 07/22/2015   Depression    Dyslipidemia 07/18/2015   Esophageal stricture 07/22/2015   Essential hypertension    Eustachian tube dysfunction 07/22/2015   Excessive daytime sleepiness 05/16/2019   Gastro-esophageal reflux disease without esophagitis    Gastroesophageal reflux disease without esophagitis 07/18/2015   Last Assessment & Plan:  Relevant Hx: Course: Daily Update: Today's Plan:appears stable with ppi  Electronically signed by: Jenelle Mages, FNP 07/24/15 1043   High risk medication use 07/22/2015   History of blood clots    Hot flash, menopausal 07/22/2015   Hyperlipidemia 07/22/2015   Last Assessment & Plan:  Relevant Hx: Course: Daily Update: Today's Plan:stable with meds  Electronically signed by: Jenelle Mages, FNP 07/24/15 1112   Hypertension    Localized swelling of both lower legs 07/22/2015   Localized swelling of both lower legs 07/22/2015   Low back strain, initial encounter 07/07/2019   Lung mass    Mixed hyperlipidemia 07/22/2015   Last Assessment & Plan:  Formatting of this note might be different from the original. Relevant Hx: Course: Daily Update: Today's Plan:stable with meds  Electronically signed by: Jenelle Mages, FNP 07/24/15 1112   Morbid obesity (HCC) 07/22/2015   Other  insomnia 11/22/2018   Pneumonia due to COVID-19 virus 01/17/2020   Pulmonary embolus (HCC) 01/18/2020   Screening for osteoporosis 02/27/2017   Formatting of this note might be different from the original. 2019: spine 3.1, L femur 1.3   Seasonal allergic rhinitis due to pollen 07/22/2015   Snores 07/22/2015   Last Assessment & Plan:  Relevant Hx: Course: Daily Update: Today's Plan:she was to have psg, but has not had this yet. Discussed that if this is present could be worse post-op with pain meds. None the less, she will have protective airway with/during surgery. She should still have psg study, but wants to wait until after knee repair  Electronically signed by: Jenelle Mages, FNP 07/24/15   Past Surgical History:  Procedure Laterality Date   ABDOMINAL HYSTERECTOMY     CARPAL TUNNEL RELEASE Right    CHOLECYSTECTOMY     ECTOPIC PREGNANCY SURGERY     X 2   FOOT SURGERY     Knee replcement     POLYPECTOMY     TONSILECTOMY/ADENOIDECTOMY WITH MYRINGOTOMY  1972   TOTAL HIP ARTHROPLASTY Right    WISDOM TOOTH EXTRACTION     Family History  Problem Relation Age of Onset   Dementia Mother    Heart attack  Father    Heart disease Father    Leukemia Half-Brother    Multiple myeloma Half-Brother    Diabetes Half-Sister    Breast cancer Neg Hx    Social History   Socioeconomic History   Marital status: Divorced    Spouse name: NONE   Number of children: 0   Years of education: 12 +  SOME [employer]   Highest education level: Some college, no degree  Occupational History   Occupation: DIABLED  Tobacco Use   Smoking status: Former    Current packs/day: 0.00    Types: Cigarettes    Quit date: 06/17/2015    Years since quitting: 8.1   Smokeless tobacco: Never  Vaping Use   Vaping status: Never Used  Substance and Sexual Activity   Alcohol use: Yes    Alcohol/week: 3.0 - 4.0 standard drinks of alcohol    Types: 3 - 4 Cans of beer per week   Drug use: No   Sexual activity: Yes   Other Topics Concern   Not on file  Social History Narrative   Not on file   Social Drivers of Health   Financial Resource Strain: Low Risk  (08/13/2023)   Overall Financial Resource Strain (CARDIA)    Difficulty of Paying Living Expenses: Not hard at all  Recent Concern: Financial Resource Strain - High Risk (07/27/2023)   Overall Financial Resource Strain (CARDIA)    Difficulty of Paying Living Expenses: Hard  Food Insecurity: No Food Insecurity (08/13/2023)   Hunger Vital Sign    Worried About Running Out of Food in the Last Year: Never true    Ran Out of Food in the Last Year: Never true  Transportation Needs: No Transportation Needs (08/13/2023)   PRAPARE - Administrator, Civil Service (Medical): No    Lack of Transportation (Non-Medical): No  Physical Activity: Sufficiently Active (08/13/2023)   Exercise Vital Sign    Days of Exercise per Week: 3 days    Minutes of Exercise per Session: 60 min  Stress: Stress Concern Present (08/13/2023)   [employer] of Occupational Health - Occupational Stress Questionnaire    Feeling of Stress : To some extent  Social Connections: Moderately Isolated (08/13/2023)   Social Connection and Isolation Panel [NHANES]    Frequency of Communication with Friends and Family: More than three times a week    Frequency of Social Gatherings with Friends and Family: Once a week    Attends Religious Services: 1 to 4 times per year    Active Member of [employer] or Organizations: No    Attends Engineer, structural: Never    Marital Status: Divorced    Tobacco Counseling Counseling given: Not Answered    Clinical Intake:  Pre-visit preparation completed: Yes  Pain : 0-10 Pain Score: 3  Pain Type: Neuropathic pain     Nutritional Risks: None Diabetes: No  How often do you need to have someone help you when you read instructions, pamphlets, or other written materials from your doctor or pharmacy?: 1 - Never What is the  last grade level you completed in school?: HSG  Interpreter Needed?: No  Information entered by :: Susie Cassette, LPN.   Activities of Daily Living     08/13/2023   10:52 AM 08/13/2023    9:32 AM  In your present state of health, do you have any difficulty performing the following activities:  Hearing? 0 0  Vision? 0 0  Difficulty concentrating or making decisions?  0 0  Walking or climbing stairs? 0 0  Dressing or bathing? 0 0  Doing errands, shopping? 0 0  Preparing Food and eating ? N N  Using the Toilet? N N  In the past six months, have you accidently leaked urine? Y Y  Do you have problems with loss of bowel control? N N  Managing your Medications? N N  Managing your Finances? N N  Housekeeping or managing your Housekeeping? N N    Patient Care Team: Blane Ohara, MD as PCP - General (Family Medicine) Georgeanna Lea, MD as Consulting Physician (Cardiology) Norva Pavlov, OD as Consulting Physician (Optometry)  Indicate any recent Medical Services you may have received from other than [facility] providers in the past year (date may be approximate).     Assessment:   This is a routine wellness examination for Clarity.  Hearing/Vision screen Hearing Screening - Comments:: Denies hearing difficulties. No hearing aids.   Vision Screening - Comments:: Wears rx glasses for driving- up to date with routine eye exams with Dr. Nettie Elm    Goals Addressed             This Visit's Progress    Client understands the importance of follow-up with providers by attending scheduled visits         Depression Screen     08/13/2023   10:51 AM 04/20/2023    9:02 AM 12/17/2022    8:52 AM 07/29/2022    3:22 PM 07/01/2022    4:25 PM 04/26/2022    5:22 PM 04/24/2022   11:32 PM  PHQ 2/9 Scores  PHQ - 2 Score 0 2 1 0 0 0 1  PHQ- 9 Score 1 9 4  0 0 0     Fall Risk     08/13/2023   10:53 AM 08/13/2023    9:32 AM 07/28/2023    9:23 AM 04/20/2023    9:04 AM 12/17/2022     8:51 AM  Fall Risk   Falls in the past year? 1 1 1 1 1   Number falls in past yr: 1 1 0 1 1  Injury with Fall? 0  0 0 1  Risk for fall due to : Orthopedic patient  No Fall Risks No Fall Risks   Follow up Falls prevention discussed;Falls evaluation completed  Falls evaluation completed Falls evaluation completed Falls evaluation completed;Falls prevention discussed    MEDICARE RISK AT HOME:  Medicare Risk at Home Any stairs in or around the home?: No If so, are there any without handrails?: No Home free of loose throw rugs in walkways, pet beds, electrical cords, etc?: Yes Adequate lighting in your home to reduce risk of falls?: Yes Life alert?: No Use of a cane, walker or w/c?: No Grab bars in the bathroom?: No Shower chair or bench in shower?: No Elevated toilet seat or a handicapped toilet?: No  TIMED UP AND GO:  Was the test performed?  No  Cognitive Function: 6CIT completed    08/13/2023   10:53 AM  MMSE - Mini Mental State Exam  Not completed: Unable to complete        08/13/2023   10:53 AM 02/16/2021    9:09 AM  6CIT Screen  What Year? 0 points 0 points  What month? 0 points 0 points  What time? 0 points 0 points  Count back from 20 0 points 0 points  Months in reverse 0 points 0 points  Repeat phrase 0 points 0 points  Total Score 0 points 0 points    Immunizations Immunization History  Administered Date(s) Administered   Influenza Inj Mdck Quad Pf 04/03/2020, 03/06/2021, 04/24/2022   Influenza, Quadrivalent, Recombinant, Inj, Pf 04/17/2017   Influenza,inj,Quad PF,6+ Mos 05/16/2019   Influenza-Unspecified 03/17/2018, 04/03/2020, 03/06/2021, 04/01/2023   Pneumococcal Polysaccharide-23 11/23/2020   Tdap 04/01/2023    Screening Tests Health Maintenance  Topic Date Due   Zoster Vaccines- Shingrix (1 of 2) Never done   Pneumococcal Vaccine 10-58 Years old (2 of 2 - PCV) 11/23/2021   Cervical Cancer Screening (HPV/Pap Cotest)  12/14/2022   Medicare Annual  Wellness (AWV)  07/02/2023   Colonoscopy  12/21/2023   MAMMOGRAM  03/31/2025   DTaP/Tdap/Td (2 - Td or Tdap) 03/31/2033   INFLUENZA VACCINE  Completed   Hepatitis C Screening  Completed   HIV Screening  Completed   HPV VACCINES  Aged Out   COVID-19 Vaccine  Discontinued    Health Maintenance  Health Maintenance Due  Topic Date Due   Zoster Vaccines- Shingrix (1 of 2) Never done   Pneumococcal Vaccine 4-42 Years old (2 of 2 - PCV) 11/23/2021   Cervical Cancer Screening (HPV/Pap Cotest)  12/14/2022   Medicare Annual Wellness (AWV)  07/02/2023   Health Maintenance Items Addressed: Yes; Patient is aware that she is due for Pneumonia and Shingrix vaccine.  Additional Screening:  Vision Screening: Recommended annual ophthalmology exams for early detection of glaucoma and other disorders of the eye.  Dental Screening: Recommended annual dental exams for proper oral hygiene  Community Resource Referral / Chronic Care Management: CRR required this visit?  No   CCM required this visit?  No     Plan:     I have personally reviewed and noted the following in the patient's chart:   Medical and social history Use of alcohol, tobacco or illicit drugs  Current medications and supplements including opioid prescriptions. Patient is not currently taking opioid prescriptions. Functional ability and status Nutritional status Physical activity Advanced directives List of other physicians Hospitalizations, surgeries, and ER visits in previous 12 months Vitals Screenings to include cognitive, depression, and falls Referrals and appointments  In addition, I have reviewed and discussed with patient certain preventive protocols, quality metrics, and best practice recommendations. A written personalized care plan for preventive services as well as general preventive health recommendations were provided to patient.     Mickeal Needy, LPN   [id number]   After Visit Summary:  (MyChart) Due to this being a telephonic visit, the after visit summary with patients personalized plan was offered to patient via MyChart   Notes: Please refer to Routing Comments.

## 2023-08-13 NOTE — Patient Instructions (Signed)
 [name] , Thank you for taking time to come for your Medicare Wellness Visit. I appreciate your ongoing commitment to your health goals. Please review the following plan we discussed and let me know if I can assist you in the future.   Referrals/Orders/Follow-Ups/Clinician Recommendations: Yes; Keep maintaining your health by keeping your appointments with Dr. Sedalia Muta and any specialists that you may see.  Call us if you need anything.  Have a great year!!!!  This is a list of the screening recommended for you and due dates:  Health Maintenance  Topic Date Due   Zoster (Shingles) Vaccine (1 of 2) Never done   Pneumococcal Vaccination (2 of 2 - PCV) 11/23/2021   Pap with HPV screening  12/14/2022   Medicare Annual Wellness Visit  07/02/2023   Colon Cancer Screening  12/21/2023   Mammogram  03/31/2025   DTaP/Tdap/Td vaccine (2 - Td or Tdap) 03/31/2033   Flu Shot  Completed   Hepatitis C Screening  Completed   HIV Screening  Completed   HPV Vaccine  Aged Out   COVID-19 Vaccine  Discontinued    Advanced directives: (Declined) Advance directive discussed with you today. Even though you declined this today, please call our office should you change your mind, and we can give you the proper paperwork for you to fill out.  Next Medicare Annual Wellness Visit scheduled for next year: Yes

## 2023-08-19 DIAGNOSIS — Z0000 Encounter for general adult medical examination without abnormal findings: Secondary | ICD-10-CM | POA: Insufficient documentation

## 2023-08-23 ENCOUNTER — Encounter: Payer: Self-pay | Admitting: Family Medicine

## 2023-08-25 ENCOUNTER — Ambulatory Visit: Payer: Self-pay | Admitting: Family Medicine
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
## 2023-08-26 ENCOUNTER — Ambulatory Visit (INDEPENDENT_AMBULATORY_CARE_PROVIDER_SITE_OTHER): Admitting: Family Medicine

## 2023-08-26 ENCOUNTER — Other Ambulatory Visit: Payer: Self-pay | Admitting: Family Medicine

## 2023-08-26 ENCOUNTER — Encounter: Payer: Self-pay | Admitting: Family Medicine

## 2023-08-26 VITALS — BP 102/70 | HR 96 | Temp 97.4°F | Resp 14 | Ht 63.0 in | Wt 202.0 lb

## 2023-08-26 DIAGNOSIS — R102 Pelvic and perineal pain: Secondary | ICD-10-CM

## 2023-08-26 DIAGNOSIS — Z23 Encounter for immunization: Secondary | ICD-10-CM | POA: Insufficient documentation

## 2023-08-26 DIAGNOSIS — F411 Generalized anxiety disorder: Secondary | ICD-10-CM | POA: Diagnosis not present

## 2023-08-26 DIAGNOSIS — R079 Chest pain, unspecified: Secondary | ICD-10-CM | POA: Diagnosis not present

## 2023-08-26 HISTORY — DX: Pelvic and perineal pain: R10.2

## 2023-08-26 LAB — POCT URINALYSIS DIP (CLINITEK)
Bilirubin, UA: NEGATIVE
Blood, UA: NEGATIVE
Glucose, UA: NEGATIVE mg/dL
Ketones, POC UA: NEGATIVE mg/dL
Leukocytes, UA: NEGATIVE
Nitrite, UA: NEGATIVE
POC PROTEIN,UA: NEGATIVE
Spec Grav, UA: 1.015 (ref 1.010–1.025)
Urobilinogen, UA: 0.2 U/dL
pH, UA: 7 (ref 5.0–8.0)

## 2023-08-26 MED ORDER — LORAZEPAM 0.5 MG PO TABS
0.5000 mg | ORAL_TABLET | Freq: Two times a day (BID) | ORAL | 1 refills | Status: DC | PRN
Start: 2023-08-26 — End: 2023-10-28

## 2023-08-26 NOTE — Assessment & Plan Note (Signed)
 Acute Intermittent dull chest pain possibly related to stress and exertion. History of myocardial infarction. Upcoming cardiologist appointment. - Perform EKG to compare with previous results. Ventricular rate - 68, PR 174, QRS 96, QTc - 438, no ST elevation or depression. Left axis deviation noted  - Recommended getting an earlier appointment with her cardiologist based on EKG findings and past medical history of cardiovascular event.

## 2023-08-26 NOTE — Assessment & Plan Note (Signed)
 POCT UA completed No dysuria or urinary frequency - Urine sent for culture

## 2023-08-26 NOTE — Progress Notes (Signed)
 Acute Office Visit  Subjective:    Patient ID: [name], female    DOB: 1966-04-30, 58 y.o.   MRN: [id number]  Chief Complaint  Patient presents with   Urinary Tract Infection   Anxiety   Chest Pain    Discussed the use of AI scribe software for clinical note transcription with the patient, who gave verbal consent to proceed.   HPI: he patient is a 58 year old with a history of heart attack who presents with chest pain.  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 The pain occurs when she turns. She has a history of heart racing and is concerned due to her past heart attack. A significant event on Saturday night heightened her concern. Her last EKG was in April of the previous year, and she is scheduled to see her cardiologist next month.  She has been experiencing stress related to personal issues with her boyfriend, which she believes may be contributing to her symptoms. She lives alone with two dogs and a cat, which adds to her stress about who would care for them if she were incapacitated.  She has previously used Ativan for anxiety, but her supply is now depleted. She indicates that she has managed well with the medication in the past and requests a refill.   Past Medical History:  Diagnosis Date   Allergic reaction to bee sting 09/09/2018   Anxiety    Asthma    Bacterial vaginosis 07/07/2019   Bulging lumbar disc 07/22/2015   Cardiomyopathy (HCC) 07/18/2015   Overview:  Overview:  T[place] T[place] 2009, normal coronaries  Last Assessment & Plan:  Relevant Hx: Course: Daily Update: Today's Plan: she has recently been seen by cards and is planned to have echo this Friday  Electronically signed by: Jenelle Mages, FNP 07/24/15 1042   Carpal tunnel syndrome 07/22/2015   Cervical strain 01/11/2019   Chronic fatigue 07/22/2015   Chronic rhinitis 07/22/2015   Depression    Dyslipidemia 07/18/2015    Esophageal stricture 07/22/2015   Essential hypertension    Eustachian tube dysfunction 07/22/2015   Excessive daytime sleepiness 05/16/2019   Gastro-esophageal reflux disease without esophagitis    Gastroesophageal reflux disease without esophagitis 07/18/2015   Last Assessment & Plan:  Relevant Hx: Course: Daily Update: Today's Plan:appears stable with ppi  Electronically signed by: Jenelle Mages, FNP 07/24/15 1043   High risk medication use 07/22/2015   History of blood clots    Hot flash, menopausal 07/22/2015   Hyperlipidemia 07/22/2015   Last Assessment & Plan:  Relevant Hx: Course: Daily Update: Today's Plan:stable with meds  Electronically signed by: Jenelle Mages, FNP 07/24/15 1112   Hypertension    Localized swelling of both lower legs 07/22/2015   Localized swelling of both lower legs 07/22/2015   Low back strain, initial encounter 07/07/2019   Lung mass    Mixed hyperlipidemia 07/22/2015   Last Assessment & Plan:  Formatting of this note might be different from the original. Relevant Hx: Course: Daily Update: Today's Plan:stable with meds  Electronically signed by: Jenelle Mages, FNP 07/24/15 1112   Morbid obesity (HCC) 07/22/2015   Other insomnia 11/22/2018   Pneumonia due to COVID-19 virus 01/17/2020   Pulmonary embolus (HCC) 01/18/2020   Screening for osteoporosis 02/27/2017   Formatting of this note might be different from the original. 2019: spine 3.1, L femur 1.3   Seasonal  allergic rhinitis due to pollen 07/22/2015   Snores 07/22/2015   Last Assessment & Plan:  Relevant Hx: Course: Daily Update: Today's Plan:she was to have psg, but has not had this yet. Discussed that if this is present could be worse post-op with pain meds. None the less, she will have protective airway with/during surgery. She should still have psg study, but wants to wait until after knee repair  Electronically signed by: Jenelle Mages, FNP 07/24/15    Past Surgical History:  Procedure  Laterality Date   ABDOMINAL HYSTERECTOMY     CARPAL TUNNEL RELEASE Right    CHOLECYSTECTOMY     ECTOPIC PREGNANCY SURGERY     X 2   FOOT SURGERY     Knee replcement     POLYPECTOMY     TONSILECTOMY/ADENOIDECTOMY WITH MYRINGOTOMY  1972   TOTAL HIP ARTHROPLASTY Right    WISDOM TOOTH EXTRACTION      Family History  Problem Relation Age of Onset   Dementia Mother    Heart attack Father    Heart disease Father    Leukemia Half-Brother    Multiple myeloma Half-Brother    Diabetes Half-Sister    Breast cancer Neg Hx     Social History   Socioeconomic History   Marital status: Divorced    Spouse name: NONE   Number of children: 0   Years of education: 12 +  SOME [employer]   Highest education level: Some college, no degree  Occupational History   Occupation: DIABLED  Tobacco Use   Smoking status: Former    Current packs/day: 0.00    Types: Cigarettes    Quit date: 06/17/2015    Years since quitting: 8.1   Smokeless tobacco: Never  Vaping Use   Vaping status: Never Used  Substance and Sexual Activity   Alcohol use: Yes    Alcohol/week: 3.0 - 4.0 standard drinks of alcohol    Types: 3 - 4 Cans of beer per week    Comment: every other day   Drug use: No   Sexual activity: Yes    Partners: Male  Other Topics Concern   Not on file  Social History Narrative   Not on file   Social Drivers of Health   Financial Resource Strain: Low Risk  (08/13/2023)   Overall Financial Resource Strain (CARDIA)    Difficulty of Paying Living Expenses: Not hard at all  Recent Concern: Financial Resource Strain - High Risk (07/27/2023)   Overall Financial Resource Strain (CARDIA)    Difficulty of Paying Living Expenses: Hard  Food Insecurity: No Food Insecurity (08/13/2023)   Hunger Vital Sign    Worried About Running Out of Food in the Last Year: Never true    Ran Out of Food in the Last Year: Never true  Transportation Needs: No Transportation Needs (08/13/2023)   PRAPARE -  Administrator, Civil Service (Medical): No    Lack of Transportation (Non-Medical): No  Physical Activity: Sufficiently Active (08/13/2023)   Exercise Vital Sign    Days of Exercise per Week: 3 days    Minutes of Exercise per Session: 60 min  Stress: Stress Concern Present (08/13/2023)   [employer] of Occupational Health - Occupational Stress Questionnaire    Feeling of Stress : To some extent  Social Connections: Moderately Isolated (08/13/2023)   Social Connection and Isolation Panel [NHANES]    Frequency of Communication with Friends and Family: More than three times a week  Frequency of Social Gatherings with Friends and Family: Once a week    Attends Religious Services: 1 to 4 times per year    Active Member of [employer] or Organizations: No    Attends Banker Meetings: Never    Marital Status: Divorced  Catering manager Violence: Not At Risk (08/13/2023)   Humiliation, Afraid, Rape, and Kick questionnaire    Fear of Current or Ex-Partner: No    Emotionally Abused: No    Physically Abused: No    Sexually Abused: No    Outpatient Medications Prior to Visit  Medication Sig Dispense Refill   acidophilus (RISAQUAD) CAPS capsule Take 1 capsule by mouth daily.     albuterol (VENTOLIN HFA) 108 (90 Base) MCG/ACT inhaler Inhale 1-2 puffs into the lungs every 6 (six) hours as needed for wheezing or shortness of breath.     Albuterol-Budesonide (AIRSUPRA) 90-80 MCG/ACT AERO Inhale 1-2 puffs into the lungs every 6 (six) hours as needed. (Patient not taking: Reported on 08/13/2023) 10.7 g 3   amLODipine (NORVASC) 10 MG tablet Take 1 tablet (10 mg total) by mouth daily. 90 tablet 1   amoxicillin (AMOXIL) 500 MG capsule 4 capsules one hour prior to dental work. 20 capsule 0   aspirin EC 81 MG tablet Take 81 mg by mouth daily. Swallow whole.     Bempedoic Acid-Ezetimibe (NEXLIZET) 180-10 MG TABS Take one tablet by mouth daily. (Patient not taking: Reported on  08/13/2023) 90 tablet 0   EPINEPHrine 0.3 mg/0.3 mL IJ SOAJ injection Inject 0.3 mg into the muscle as needed for anaphylaxis. 2 each 1   fluticasone (FLONASE) 50 MCG/ACT nasal spray Place 1 spray into both nostrils daily.     Fluticasone-Umeclidin-Vilant (TRELEGY ELLIPTA) 200-62.5-25 MCG/ACT AEPB Inhale 1 Inhalation into the lungs daily. 1 each 11   furosemide (LASIX) 20 MG tablet Take 1 tablet (20 mg total) by mouth as needed for fluid or edema. 90 tablet 0   ibuprofen (ADVIL) 600 MG tablet Take 1 tablet (600 mg total) by mouth every 6 (six) hours as needed. 30 tablet 0   Multiple Vitamin (MULTIVITAMIN) tablet Take 1 tablet by mouth daily.     nitroGLYCERIN (NITROSTAT) 0.4 MG SL tablet Place 1 tablet (0.4 mg total) under the tongue every 5 (five) minutes as needed for chest pain (x3 total. call ems if chest pain does not resolve after one, call ems.). 25 tablet 0   sertraline (ZOLOFT) 100 MG tablet TAKE 1 & 1/2 (ONE & ONE-HALF) TABLETS BY MOUTH ONCE DAILY 135 tablet 0   tiZANidine (ZANAFLEX) 4 MG tablet Take 1 tablet (4 mg total) by mouth every 8 (eight) hours as needed for muscle spasms. 10 tablet 0   Facility-Administered Medications Prior to Visit  Medication Dose Route Frequency Provider Last Rate Last Admin   ipratropium-albuterol (DUONEB) 0.5-2.5 (3) MG/3ML nebulizer solution 3 mL  3 mL Nebulization Once         Allergies  Allergen Reactions   Demerol Anaphylaxis   Meperidine Anaphylaxis   Sulfamethoxazole Itching    With redness to skin    Citalopram Hydrobromide Other (See Comments)    Unknown   Hydrocodone    Hydrocodone-Acetaminophen Other (See Comments)    Unknown   Metoprolol Tartrate Other (See Comments)    Chest discomfort, palpations   Oxycodone-Acetaminophen Other (See Comments)    Unknown   Rosuvastatin Other (See Comments)    myalgias   Statins     myalgias   Diltiazem Rash  Metoprolol Tartrate Palpitations    Review of Systems  Constitutional:  Negative  for chills, fatigue and fever.  HENT:  Negative for congestion, ear pain and sore throat.   Respiratory:  Negative for cough and shortness of breath.   Cardiovascular:  Positive for chest pain. Negative for palpitations.  Gastrointestinal:  Positive for abdominal pain (suprapubic pain). Negative for constipation, diarrhea, nausea and vomiting.  Endocrine: Negative for polydipsia, polyphagia and polyuria.  Genitourinary:  Negative for difficulty urinating, dysuria and frequency.  Musculoskeletal:  Negative for arthralgias, back pain and myalgias.  Skin:  Negative for rash.  Neurological:  Negative for headaches.  Psychiatric/Behavioral:  Negative for dysphoric mood. The patient is not nervous/anxious.        Objective:        08/26/2023    9:08 AM 08/13/2023   10:45 AM 07/28/2023    9:20 AM  Vitals with BMI  Height 5\' 3"  5\' 3"  5\' 3"   Weight 202 lbs 198 lbs 198 lbs  BMI 35.79 35.08 35.08  Systolic 102  122  Diastolic 70  68  Pulse 96  68    No data found.   Physical Exam Constitutional:      General: She is not in acute distress.    Appearance: Normal appearance. She is obese. She is not ill-appearing or diaphoretic.  Neck:     Vascular: No carotid bruit.  Cardiovascular:     Rate and Rhythm: Normal rate and regular rhythm.     Heart sounds: Normal heart sounds. No murmur heard. Pulmonary:     Effort: Pulmonary effort is normal. No respiratory distress.     Breath sounds: Normal breath sounds. No decreased breath sounds, wheezing or rhonchi.  Chest:     Chest wall: No tenderness.  Musculoskeletal:        General: Normal range of motion.  Skin:    General: Skin is warm.  Neurological:     General: No focal deficit present.     Mental Status: She is alert and oriented to person, place, and time. Mental status is at baseline.     Motor: No weakness.  Psychiatric:        Mood and Affect: Mood is anxious.        Behavior: Behavior normal. Behavior is not agitated.      Health Maintenance Due  Topic Date Due   Zoster Vaccines- Shingrix (1 of 2) Never done   Cervical Cancer Screening (HPV/Pap Cotest)  12/14/2022   Colonoscopy  12/21/2023    There are no preventive care reminders to display for this patient.   Lab Results  Component Value Date   TSH 2.230 07/28/2023   Lab Results  Component Value Date   WBC 6.4 07/28/2023   HGB 14.2 07/28/2023   HCT 42.7 07/28/2023   MCV 96 07/28/2023   PLT 374 07/28/2023   Lab Results  Component Value Date   NA 141 07/28/2023   K 5.5 (H) 07/28/2023   CO2 26 07/28/2023   GLUCOSE 94 07/28/2023   BUN 6 07/28/2023   CREATININE 0.66 07/28/2023   BILITOT 0.4 07/28/2023   ALKPHOS 119 07/28/2023   AST 22 07/28/2023   ALT 14 07/28/2023   PROT 6.6 07/28/2023   ALBUMIN 4.3 07/28/2023   CALCIUM 9.8 07/28/2023   ANIONGAP 10 12/15/2022   EGFR 102 07/28/2023   Lab Results  Component Value Date   CHOL 267 (H) 07/28/2023   Lab Results  Component Value Date  HDL 109 07/28/2023   Lab Results  Component Value Date   LDLCALC 142 (H) 07/28/2023   Lab Results  Component Value Date   TRIG 97 07/28/2023   Lab Results  Component Value Date   CHOLHDL 2.4 07/28/2023   Lab Results  Component Value Date   HGBA1C 5.0 11/23/2020       Assessment & Plan:  Chest pain, unspecified type Assessment & Plan: Acute Intermittent dull chest pain possibly related to stress and exertion. History of myocardial infarction. Upcoming cardiologist appointment. - Perform EKG to compare with previous results. Ventricular rate - 68, PR 174, QRS 96, QTc - 438, no ST elevation or depression. Left axis deviation noted  - Recommended getting an earlier appointment with her cardiologist based on EKG findings and past medical history of cardiovascular event.   Orders: -     EKG 12-Lead  Suprapubic pain Assessment & Plan: POCT UA completed No dysuria or urinary frequency - Urine sent for culture  Orders: -      POCT URINALYSIS DIP (CLINITEK) -     Urine Culture  GAD (generalized anxiety disorder) Assessment & Plan: Experiencing stress and anxiety. Ativan previously effective. -  Ativan refill sent   Orders: -     LORazepam; Take 1 tablet (0.5 mg total) by mouth 2 (two) times daily as needed for anxiety.  Dispense: 30 tablet; Refill: 1  Immunization due -     Pneumococcal conjugate vaccine 20-valent     Assessment and Plan       Meds ordered this encounter  Medications   LORazepam (ATIVAN) 0.5 MG tablet    Sig: Take 1 tablet (0.5 mg total) by mouth 2 (two) times daily as needed for anxiety.    Dispense:  30 tablet    Refill:  1    Orders Placed This Encounter  Procedures   Urine Culture   Pneumococcal conjugate vaccine 20-valent   POCT URINALYSIS DIP (CLINITEK)   EKG 12-Lead     Follow-up: Return if symptoms worsen or fail to improve.  An After Visit Summary was printed and given to the patient.  Total time spent on today's visit was 45 minutes, including both face-to-face time and nonface-to-face time personally spent on review of chart (labs and imaging), discussing labs and goals, discussing further work-up, treatment options, referrals to specialist if needed, reviewing outside records if pertinent, answering patient's questions, and coordinating care.    Lajuana Matte, FNP [facility] [phone]

## 2023-08-26 NOTE — Assessment & Plan Note (Signed)
 Experiencing stress and anxiety. Ativan previously effective. -  Ativan refill sent

## 2023-08-27 LAB — URINE CULTURE

## 2023-08-28 ENCOUNTER — Other Ambulatory Visit: Payer: Self-pay | Admitting: Family Medicine

## 2023-08-31 ENCOUNTER — Other Ambulatory Visit: Payer: Self-pay

## 2023-08-31 ENCOUNTER — Encounter: Payer: Self-pay | Admitting: Cardiology

## 2023-08-31 MED ORDER — EZETIMIBE 10 MG PO TABS: 10.0000 mg | ORAL_TABLET | Freq: Every day | ORAL | 1 refills | Status: DC

## 2023-08-31 NOTE — Progress Notes (Signed)
 " Cardiology Office Note:  .   Date:  09/01/2023  ID:  [name], DOB 1965/10/06, MRN [id number] PCP: Sherre Clapper, MD  [facility] Providers Cardiologist:  Lamar Fitch, MD    History of Present Illness: .   [name] is a 58 y.o. female with a past medical history of hypertension, nonischemic cardiomyopathy, GERD, carpal tunnel syndrome, gout, dyslipidemia, obesity, emphysema.  09/30/2022 echo EF 60 to 65%, trivial MR, aortic sclerosis present without stenosis 05/14/2020 coronary CTA calcium  score of 0  Most recently evaluated by Dr. Fitch on 09/25/2022, she was stable from a cardiac perspective.  No changes were made she was advised to follow-up in 1 year.  She was evaluated by her PCP, EKG was noted to be abnormal, she was also having episodes of intermittent chest pain.  She presents to a for follow-up.  We reviewed her recent EKGs, she does have left axis deviation however this has been present for many years and is overall unchanged.  She does mention an event where she woke up in the middle of the night, this was following a high level of personal stress, she had an episode of chest pain and also palpitations.  Does not sound to be consistent with angina.  She previously had a coronary CTA which was very reassuring in 2021 revealing no coronary artery disease.  Regarding her palpitations, sounds like it could be episodes of SVT however cannot completely rule out atrial fibrillation. She denies chest pain, dyspnea, pnd, orthopnea, n, v, dizziness, syncope, edema, weight gain, or early satiety.   ROS: Review of Systems  Cardiovascular:  Positive for palpitations and leg swelling (occasional).  All other systems reviewed and are negative.    Studies Reviewed: .       Cardiac Studies & Procedures   ______________________________________________________________________________________________     ECHOCARDIOGRAM  ECHOCARDIOGRAM COMPLETE  09/30/2022  Narrative ECHOCARDIOGRAM REPORT    Patient Name:   [name] Date of Exam: 09/30/2022 Medical Rec #:  [id number]      Height:       63.0 in Accession #:    [id number]     Weight:       215.0 lb Date of Birth:  [birth year]      BSA:          1.994 m Patient Age:    56 years       BP:           148/80 mmHg Patient Gender: F              HR:           68 bpm. Exam Location:  [facility]  Procedure: 2D Echo, Cardiac Doppler, Color Doppler and Strain Analysis  Indications:    Dilated cardiomyopathy [I42.0 (ICD-10-CM)]  History:        Patient has prior history of Echocardiogram examinations, most recent 05/23/2020. Cardiomyopathy; Risk Factors:Hypertension and Dyslipidemia.  Sonographer:    Charlie Jointer RDCS Referring Phys: [id number] ROBERT J KRASOWSKI  IMPRESSIONS   1. Left ventricular ejection fraction, by estimation, is 60 to 65%. The left ventricle has normal function. The left ventricle has no regional wall motion abnormalities. Left ventricular diastolic parameters were normal. 2. Right ventricular systolic function is normal. The right ventricular size is normal. 3. The mitral valve is normal in structure. Trivial mitral valve regurgitation. No evidence of mitral stenosis. 4. The aortic valve is normal in structure. Aortic valve regurgitation is not visualized. Aortic  valve sclerosis/calcification is present, without any evidence of aortic stenosis. 5. The inferior vena cava is normal in size with greater than 50% respiratory variability, suggesting right atrial pressure of 3 mmHg.  FINDINGS Left Ventricle: Left ventricular ejection fraction, by estimation, is 60 to 65%. The left ventricle has normal function. The left ventricle has no regional wall motion abnormalities. The left ventricular internal cavity size was normal in size. There is no left ventricular hypertrophy. Left ventricular diastolic parameters were normal.  Right Ventricle: The right ventricular  size is normal. No increase in right ventricular wall thickness. Right ventricular systolic function is normal.  Left Atrium: Left atrial size was normal in size.  Right Atrium: Right atrial size was normal in size.  Pericardium: There is no evidence of pericardial effusion.  Mitral Valve: The mitral valve is normal in structure. Trivial mitral valve regurgitation. No evidence of mitral valve stenosis.  Tricuspid Valve: The tricuspid valve is normal in structure. Tricuspid valve regurgitation is trivial. No evidence of tricuspid stenosis.  Aortic Valve: The aortic valve is normal in structure. Aortic valve regurgitation is not visualized. Aortic valve sclerosis/calcification is present, without any evidence of aortic stenosis.  Pulmonic Valve: The pulmonic valve was normal in structure. Pulmonic valve regurgitation is not visualized. No evidence of pulmonic stenosis.  Aorta: The aortic root is normal in size and structure.  Venous: The inferior vena cava is normal in size with greater than 50% respiratory variability, suggesting right atrial pressure of 3 mmHg.  IAS/Shunts: No atrial level shunt detected by color flow Doppler.   LEFT VENTRICLE PLAX 2D LVIDd:         4.90 cm   Diastology LVIDs:         3.20 cm   LV e' medial:    6.71 cm/s LV PW:         1.00 cm   LV E/e' medial:  14.3 LV IVS:        1.00 cm   LV e' lateral:   10.53 cm/s LVOT diam:     1.90 cm   LV E/e' lateral: 9.1 LV SV:         74 LV SV Index:   37        2D Longitudinal Strain LVOT Area:     2.84 cm  2D Strain GLS Avg:     -17.7 %   RIGHT VENTRICLE             IVC RV Basal diam:  2.90 cm     IVC diam: 1.90 cm RV Mid diam:    2.10 cm RV S prime:     12.30 cm/s TAPSE (M-mode): 2.6 cm  LEFT ATRIUM             Index        RIGHT ATRIUM           Index LA diam:        4.40 cm 2.21 cm/m   RA Area:     18.30 cm LA Vol (A2C):   56.9 ml 28.54 ml/m  RA Volume:   42.50 ml  21.31 ml/m LA Vol (A4C):   49.0 ml  24.57 ml/m LA Biplane Vol: 54.9 ml 27.53 ml/m AORTIC VALVE             PULMONIC VALVE LVOT Vmax:   127.00 cm/s PR End Diast Vel: 2.84 msec LVOT Vmean:  78.000 cm/s LVOT VTI:    0.261 m  AORTA Ao Root diam:  3.30 cm Ao Asc diam:  3.20 cm Ao Desc diam: 2.20 cm  MITRAL VALVE MV Area (PHT): 2.88 cm    SHUNTS MV Decel Time: 264 msec    Systemic VTI:  0.26 m MV E velocity: 96.00 cm/s  Systemic Diam: 1.90 cm MV A velocity: 97.73 cm/s MV E/A ratio:  0.98  Lamar Fitch MD Electronically signed by Lamar Fitch MD Signature Date/Time: 09/30/2022/12:31:36 PM    Final      CT SCANS  CT CORONARY MORPH W/CTA COR W/SCORE 05/14/2020  Addendum 05/14/2020  6:11 PM ADDENDUM REPORT: 05/14/2020 18:09  CLINICAL DATA:  Chest pain  EXAM: Cardiac CTA  MEDICATIONS: Sub lingual nitro. 4mg  x 2  : The patient was scanned on a Siemens 192 slice scanner. Gantry rotation speed was 250 msecs. Collimation was 0.6 mm. A 100 kV prospective scan was triggered in the ascending thoracic aorta at 35-75% of the R-R interval. Average HR during the scan was 60 bpm. The 3D data set was interpreted on a dedicated work station using MPR, MIP and VRT modes. A total of 80cc of contrast was used.  FINDINGS: Non-cardiac: See separate report from [facility].  Pulmonary veins drain normally to the left atrium. No LA appendage thrombus visualized.  Calcium  Score: 0 Agatston units.  Coronary Arteries: Right dominant with no anomalies  LM: No plaque or stenosis.  LAD system: No plaque or stenosis. Small area of mid-LAD myocardial bridging.  Circumflex system: No plaque or stenosis.  RCA system: No plaque or stenosis.  IMPRESSION: 1.  Coronary artery calcium  score 0 Agatston units.  2.  No significant coronary disease noted.  [name]   Electronically Signed By: Ezra Shuck M.D. On: 05/14/2020 18:09  Narrative EXAM: OVER-READ INTERPRETATION  CT CHEST  The  following report is an over-read performed by radiologist Dr. Rockey Kilts of [facility] on 05/14/2020. This over-read does not include interpretation of cardiac or coronary anatomy or pathology. The coronary CTA interpretation by the cardiologist is attached.  COMPARISON:  02/11/2011 chest radiograph. CTA chest from [facility] dated 02/22/2018 is also reviewed.  FINDINGS: Vascular: Aortic atherosclerosis. No central pulmonary embolism, on this non-dedicated study.  Mediastinum/Nodes: No imaged thoracic adenopathy. Subtle fluid level in the esophagus on 27/11.  Lungs/Pleura: No pleural fluid.  Clear imaged lungs.  Upper Abdomen: Normal imaged portions of the liver, spleen, stomach.  Musculoskeletal: Mild thoracic spondylosis.  IMPRESSION: 1. No acute findings in the imaged extracardiac chest. 2. Esophageal air fluid level suggests dysmotility or gastroesophageal reflux. 3. Aortic Atherosclerosis (ICD10-I70.0).  Electronically Signed: By: Rockey Kilts M.D. On: 05/14/2020 16:28     ______________________________________________________________________________________________      Risk Assessment/Calculations:             Physical Exam:   VS:  BP 128/76   Pulse 76   Ht 5' 3 (1.6 m)   Wt 200 lb (90.7 kg)   LMP  (LMP Unknown)   SpO2 98%   BMI 35.43 kg/m    Wt Readings from Last 3 Encounters:  09/01/23 200 lb (90.7 kg)  08/26/23 202 lb (91.6 kg)  08/13/23 198 lb (89.8 kg)    GEN: Well nourished, well developed in no acute distress NECK: No JVD; No carotid bruits CARDIAC: RRR, no murmurs, rubs, gallops RESPIRATORY:  Clear to auscultation without rales, wheezing or rhonchi  ABDOMEN: Soft, non-tender, non-distended EXTREMITIES:  No edema; No deformity   ASSESSMENT AND PLAN: .   Palpitations/hyperkalemia-this has been bothering her  for some time, increasing with frequency.  We will arrange for a 2-week monitor.  Extensive lab work completed by  her PCP a few weeks ago including TSH.  Labs were normal with the exception of her potassium.  Will repeat BMET today.  Chest pain of uncertain origin-she had 1 episode as outlined above in the HPI, surrounding a stressful personal event.  She has not had any further episodes.  Previous coronary CTA revealed a calcium  score of 0.  At this time I do not think she needs an ischemic evaluation.  Hypertension-blood pressure well-controlled at 120/76, continue Norvasc  10 mg daily.  Dyslipidemia-formally monitored by PCP, most recent LDL is elevated at 142 and she is currently on Zetia .  Nonischemic cardiomyopathy-NYHA class I.  Continue Lasix  as needed.       Dispo: Repeat BMET, 2-week monitor, follow-up in a year--sooner if testing is abnormal.  Signed, Dawn JAYSON Hoover, NP  "

## 2023-09-01 ENCOUNTER — Ambulatory Visit

## 2023-09-01 ENCOUNTER — Ambulatory Visit: Attending: Cardiology | Admitting: Cardiology

## 2023-09-01 ENCOUNTER — Encounter: Payer: Self-pay | Admitting: Cardiology

## 2023-09-01 VITALS — BP 128/76 | HR 76 | Ht 63.0 in | Wt 200.0 lb

## 2023-09-01 DIAGNOSIS — R079 Chest pain, unspecified: Secondary | ICD-10-CM

## 2023-09-01 DIAGNOSIS — E875 Hyperkalemia: Secondary | ICD-10-CM

## 2023-09-01 DIAGNOSIS — I10 Essential (primary) hypertension: Secondary | ICD-10-CM | POA: Diagnosis not present

## 2023-09-01 DIAGNOSIS — I420 Dilated cardiomyopathy: Secondary | ICD-10-CM

## 2023-09-01 DIAGNOSIS — R002 Palpitations: Secondary | ICD-10-CM

## 2023-09-01 DIAGNOSIS — E782 Mixed hyperlipidemia: Secondary | ICD-10-CM

## 2023-09-01 NOTE — Patient Instructions (Signed)
 Medication Instructions:  Your physician recommends that you continue on your current medications as directed. Please refer to the Current Medication list given to you today.   *If you need a refill on your cardiac medications before your next appointment, please call your pharmacy*   Lab Work: Your physician recommends that you return for lab work in: Today for Basic Metabolic Panel  If you have labs (blood work) drawn today and your tests are completely normal, you will receive your results only by: MyChart Message (if you have MyChart) OR A paper copy in the mail If you have any lab test that is abnormal or we need to change your treatment, we will call you to review the results.   Testing/Procedures: You have been asked to wear a Zio Heart Monitor today. It is to be worn for 14 days. Please remove the monitor on 09/15/23 and mail back in the box provided.  If you have any questions about the monitor please call the company at [phone]     Follow-Up: At [facility], you and your health needs are our priority.  As part of our continuing mission to provide you with exceptional heart care, we have created designated Provider Care Teams.  These Care Teams include your primary Cardiologist (physician) and Advanced Practice Providers (APPs -  Physician Assistants and Nurse Practitioners) who all work together to provide you with the care you need, when you need it.  We recommend signing up for the patient portal called "MyChart".  Sign up information is provided on this After Visit Summary.  MyChart is used to connect with patients for Virtual Visits (Telemedicine).  Patients are able to view lab/test results, encounter notes, upcoming appointments, etc.  Non-urgent messages can be sent to your provider as well.   To learn more about what you can do with MyChart, go to [email or web address].    Your next appointment:   1 year(s)  Provider:   Gypsy Balsam, MD    Other  Instructions

## 2023-09-02 LAB — BASIC METABOLIC PANEL WITH GFR
BUN/Creatinine Ratio: 19 (ref 9–23)
BUN: 9 mg/dL (ref 6–24)
CO2: 26 mmol/L (ref 20–29)
Calcium: 9.5 mg/dL (ref 8.7–10.2)
Chloride: 102 mmol/L (ref 96–106)
Creatinine, Ser: 0.47 mg/dL — ABNORMAL LOW (ref 0.57–1.00)
Glucose: 77 mg/dL (ref 70–99)
Potassium: 4.5 mmol/L (ref 3.5–5.2)
Sodium: 142 mmol/L (ref 134–144)
eGFR: 111 mL/min/1.73

## 2023-09-03 ENCOUNTER — Ambulatory Visit: Payer: Self-pay

## 2023-09-03 NOTE — Telephone Encounter (Signed)
 Copied from CRM #[id number]. Topic: Clinical - Red Word Triage >> Sep 03, 2023  2:24 PM Alessandra Bevels wrote: Red Word that prompted transfer to Nurse Triage: Patient si calling to report left ear pain 10/10. Please advise   Chief Complaint: Ear pain  Symptoms: Left ear pain  Frequency: Constant  Pertinent Negatives: Patient denies fever Disposition: [] ED /[] Urgent Care (no appt availability in office) / [x] Appointment(In office/virtual)/ []  [facility]/ [] Home Care/ [] Refused Recommended Disposition /[] [facility]/ []  Follow-up with PCP Additional Notes: Patient reports she has been experiencing left ear pain for the last 2-3 days. She denies any other symptom with her ear pain. Patient requesting an appointment tomorrow so she can be evaluated before the weekend. Appointment made for the patient tomorrow.   Reason for Disposition  Earache  (Exceptions: brief ear pain of < 60 minutes duration, earache occurring during air travel  Answer Assessment - Initial Assessment Questions 1. LOCATION: "Which ear is involved?"     Left ear  2. ONSET: "When did the ear start hurting"      2-3 days  3. SEVERITY: "How bad is the pain?"  (Scale 1-10; mild, moderate or severe)   - MILD (1-3): doesn't interfere with normal activities    - MODERATE (4-7): interferes with normal activities or awakens from sleep    - SEVERE (8-10): excruciating pain, unable to do any normal activities      6/10 4. URI SYMPTOMS: "Do you have a runny nose or cough?"     No 5. FEVER: "Do you have a fever?" If Yes, ask: "What is your temperature, how was it measured, and when did it start?"     No 6. CAUSE: "Have you been swimming recently?", "How often do you use Q-TIPS?", "Have you had any recent air travel or scuba diving?"     Unsure  7. OTHER SYMPTOMS: "Do you have any other symptoms?" (e.g., headache, stiff neck, dizziness, vomiting, runny nose, decreased hearing)     No  Protocols used:  Earache-A-AH

## 2023-09-04 ENCOUNTER — Ambulatory Visit (INDEPENDENT_AMBULATORY_CARE_PROVIDER_SITE_OTHER): Admitting: Physician Assistant

## 2023-09-04 ENCOUNTER — Encounter: Payer: Self-pay | Admitting: Physician Assistant

## 2023-09-04 VITALS — BP 128/78 | HR 64 | Temp 98.0°F | Ht 63.0 in | Wt 201.0 lb

## 2023-09-04 DIAGNOSIS — J302 Other seasonal allergic rhinitis: Secondary | ICD-10-CM

## 2023-09-04 DIAGNOSIS — H60332 Swimmer's ear, left ear: Secondary | ICD-10-CM | POA: Insufficient documentation

## 2023-09-04 MED ORDER — OFLOXACIN 0.3 % OT SOLN: 5.0000 [drp] | Freq: Every day | OTIC | 2 refills | Status: DC

## 2023-09-04 MED ORDER — LORATADINE 10 MG PO CAPS: 10.0000 mg | ORAL_CAPSULE | Freq: Every day | ORAL | 3 refills | Status: AC

## 2023-09-04 NOTE — Patient Instructions (Signed)
 VISIT SUMMARY:  During today's visit, we discussed your left ear pain and allergy symptoms. You have been experiencing ear pain for a few days and have tried self-treatment with minimal relief. Additionally, your allergy symptoms have been worsening despite taking over-the-counter Allegra. We also talked about your recent illness and the possibility of your remaining wisdom tooth or frequent headphone use contributing to your discomfort.  YOUR PLAN:  -OTITIS EXTERNA: Otitis externa is an infection of the outer ear canal. We will treat this with topical ear drops; use 5 drops in your left ear daily at night for 5 days. Avoid using Q-tips and inserting headphones in the affected ear. Clean your headphones regularly to prevent reintroducing bacteria. Monitor for signs of the infection spreading, such as redness, tenderness behind the ear, or fever, which would require oral antibiotics.  -ALLERGIC RHINITIS: Allergic rhinitis is an allergic reaction that causes sneezing, congestion, and a runny nose. Since your current medication, Allegra, is not effective, we will switch you to Claritin. A prescription has been sent to your pharmacy.  INSTRUCTIONS:  Please follow up if your ear pain worsens or if you notice signs of the infection spreading. Additionally, monitor your allergy symptoms and let us know if the new medication does not provide relief.

## 2023-09-04 NOTE — Assessment & Plan Note (Signed)
 Left ear pain due to otitis externa. Outer ear infection persists despite hydrogen peroxide use. Informed about risk of infection spreading requiring oral antibiotics if symptoms worsen. - Prescribe topical ear drops for the left ear, 5 drops daily at night for 5 days. - Advise against using Q-tips and inserting headphones in the affected ear. - Instruct to clean headphones to prevent bacterial reintroduction. - Monitor for signs of infection spreading, such as redness and tenderness behind the ear or fever, which would require oral antibiotics.

## 2023-09-04 NOTE — Progress Notes (Signed)
 Acute Office Visit  Subjective:    Patient ID: [name], female    DOB: August 21, 1965, 58 y.o.   MRN: [id number]  Chief Complaint  Patient presents with   Left ear pain    HPI: Patient is in today for worsening ear pain and fullness.  Discussed the use of AI scribe software for clinical note transcription with the patient, who gave verbal consent to proceed.  History of Present Illness   The patient, with a history of allergies, presents with left ear pain for a few days. She attempted self-treatment with hydrogen peroxide, which provided minimal relief. She has been experiencing allergy symptoms, such as sneezing, and has been taking over-the-counter Allegra. However, she believes it's time to switch allergy medications as her symptoms seem to be worsening this year. She also mentions having been sick recently, but it's unclear if this is related to her current ear pain.  In addition to her ear pain, the patient mentions having one remaining wisdom tooth, which she speculates could be contributing to her discomfort. She also mentions wearing headphones frequently, which could potentially be contributing to her ear issues.       Past Medical History:  Diagnosis Date   Allergic reaction to bee sting 09/09/2018   Anxiety    Asthma    Bacterial vaginosis 07/07/2019   Bulging lumbar disc 07/22/2015   Cardiomyopathy (HCC) 07/18/2015   Overview:  Overview:  [place] 2009, normal coronaries  Last Assessment & Plan:  Relevant Hx: Course: Daily Update: Today's Plan: she has recently been seen by cards and is planned to have echo this Friday  Electronically signed by: Jenelle Mages, FNP 07/24/15 1042   Carpal tunnel syndrome 07/22/2015   Cervical strain 01/11/2019   Chronic fatigue 07/22/2015   Chronic rhinitis 07/22/2015   Depression    Dyslipidemia 07/18/2015   Esophageal stricture 07/22/2015   Essential hypertension    Eustachian tube dysfunction 07/22/2015    Excessive daytime sleepiness 05/16/2019   Gastro-esophageal reflux disease without esophagitis    Gastroesophageal reflux disease without esophagitis 07/18/2015   Last Assessment & Plan:  Relevant Hx: Course: Daily Update: Today's Plan:appears stable with ppi  Electronically signed by: Jenelle Mages, FNP 07/24/15 1043   High risk medication use 07/22/2015   History of blood clots    Hot flash, menopausal 07/22/2015   Hyperlipidemia 07/22/2015   Last Assessment & Plan:  Relevant Hx: Course: Daily Update: Today's Plan:stable with meds  Electronically signed by: Jenelle Mages, FNP 07/24/15 1112   Hypertension    Hypertensive heart disease with heart failure (HCC) 04/20/2023   Localized swelling of both lower legs 07/22/2015   Localized swelling of both lower legs 07/22/2015   Low back strain, initial encounter 07/07/2019   Lung mass    Lung nodules    Mixed hyperlipidemia 07/22/2015   Last Assessment & Plan:  Formatting of this note might be different from the original. Relevant Hx: Course: Daily Update: Today's Plan:stable with meds  Electronically signed by: Jenelle Mages, FNP 07/24/15 1112   Morbid obesity (HCC) 07/22/2015   Other insomnia 11/22/2018   Pneumonia due to COVID-19 virus 01/17/2020   Primary osteoarthritis involving multiple joints 05/15/2016   Prophylactic antibiotic 08/01/2023   Pulmonary embolus (HCC) 01/18/2020   Right hand pain 07/29/2022   Screening for lung cancer 08/01/2023   Screening for osteoporosis 02/27/2017   Formatting of this note might be different from the original. 2019: spine 3.1, L femur  1.3   Seasonal allergic rhinitis due to pollen 07/22/2015   Sensation of fullness in both ears 08/01/2023   Severe obesity with body mass index (BMI) of 35.0 to 35.9 and comorbidity (HCC) 07/22/2015   Snores 07/22/2015   Last Assessment & Plan:  Relevant Hx: Course: Daily Update: Today's Plan:she was to have psg, but has not had this yet.  Discussed that if this is present could be worse post-op with pain meds. None the less, she will have protective airway with/during surgery. She should still have psg study, but wants to wait until after knee repair  Electronically signed by: Jenelle Mages, FNP 07/24/15   Suprapubic pain 08/26/2023   Urinary frequency 05/01/2023   Vertigo 11/25/2021    Past Surgical History:  Procedure Laterality Date   ABDOMINAL HYSTERECTOMY     CARPAL TUNNEL RELEASE Right    CHOLECYSTECTOMY     ECTOPIC PREGNANCY SURGERY     X 2   FOOT SURGERY     Knee replcement     POLYPECTOMY     TONSILECTOMY/ADENOIDECTOMY WITH MYRINGOTOMY  1972   TOTAL HIP ARTHROPLASTY Right    WISDOM TOOTH EXTRACTION      Family History  Problem Relation Age of Onset   Dementia Mother    Heart attack Father    Heart disease Father    Leukemia Half-Brother    Multiple myeloma Half-Brother    Diabetes Half-Sister    Breast cancer Neg Hx     Social History   Socioeconomic History   Marital status: Divorced    Spouse name: NONE   Number of children: 0   Years of education: 12 +  SOME [employer]   Highest education level: Some college, no degree  Occupational History   Occupation: DIABLED  Tobacco Use   Smoking status: Former    Current packs/day: 0.00    Types: Cigarettes    Quit date: 06/17/2015    Years since quitting: 8.2   Smokeless tobacco: Never  Vaping Use   Vaping status: Never Used  Substance and Sexual Activity   Alcohol use: Yes    Alcohol/week: 3.0 - 4.0 standard drinks of alcohol    Types: 3 - 4 Cans of beer per week    Comment: every other day   Drug use: No   Sexual activity: Yes    Partners: Male  Other Topics Concern   Not on file  Social History Narrative   Not on file   Social Drivers of Health   Financial Resource Strain: Low Risk  (08/13/2023)   Overall Financial Resource Strain (CARDIA)    Difficulty of Paying Living Expenses: Not hard at all  Recent Concern: Financial  Resource Strain - High Risk (07/27/2023)   Overall Financial Resource Strain (CARDIA)    Difficulty of Paying Living Expenses: Hard  Food Insecurity: No Food Insecurity (08/13/2023)   Hunger Vital Sign    Worried About Running Out of Food in the Last Year: Never true    Ran Out of Food in the Last Year: Never true  Transportation Needs: No Transportation Needs (08/13/2023)   PRAPARE - Administrator, Civil Service (Medical): No    Lack of Transportation (Non-Medical): No  Physical Activity: Sufficiently Active (08/13/2023)   Exercise Vital Sign    Days of Exercise per Week: 3 days    Minutes of Exercise per Session: 60 min  Stress: Stress Concern Present (08/13/2023)   [employer] of Occupational Health - Occupational Stress  Questionnaire    Feeling of Stress : To some extent  Social Connections: Moderately Isolated (08/13/2023)   Social Connection and Isolation Panel [NHANES]    Frequency of Communication with Friends and Family: More than three times a week    Frequency of Social Gatherings with Friends and Family: Once a week    Attends Religious Services: 1 to 4 times per year    Active Member of [employer] or Organizations: No    Attends Banker Meetings: Never    Marital Status: Divorced  Catering manager Violence: Not At Risk (08/13/2023)   Humiliation, Afraid, Rape, and Kick questionnaire    Fear of Current or Ex-Partner: No    Emotionally Abused: No    Physically Abused: No    Sexually Abused: No    Outpatient Medications Prior to Visit  Medication Sig Dispense Refill   albuterol (VENTOLIN HFA) 108 (90 Base) MCG/ACT inhaler Inhale 1-2 puffs into the lungs every 6 (six) hours as needed for wheezing or shortness of breath.     Albuterol-Budesonide (AIRSUPRA) 90-80 MCG/ACT AERO Inhale 1-2 puffs into the lungs every 6 (six) hours as needed. 10.7 g 3   amLODipine (NORVASC) 10 MG tablet Take 10 mg by mouth as needed (for high blood pressure).      amoxicillin (AMOXIL) 500 MG capsule 4 capsules one hour prior to dental work. 20 capsule 0   aspirin EC 81 MG tablet Take 81 mg by mouth daily. Swallow whole.     EPINEPHrine 0.3 mg/0.3 mL IJ SOAJ injection Inject 0.3 mg into the muscle as needed for anaphylaxis. 2 each 1   ezetimibe (ZETIA) 10 MG tablet Take 1 tablet (10 mg total) by mouth daily. 90 tablet 1   fluticasone (FLONASE) 50 MCG/ACT nasal spray Place 1 spray into both nostrils daily.     Fluticasone-Umeclidin-Vilant (TRELEGY ELLIPTA) 200-62.5-25 MCG/ACT AEPB Inhale 1 Inhalation into the lungs daily. 1 each 11   furosemide (LASIX) 20 MG tablet Take 1 tablet (20 mg total) by mouth as needed for fluid or edema. 90 tablet 0   ibuprofen (ADVIL) 600 MG tablet Take 1 tablet (600 mg total) by mouth every 6 (six) hours as needed. 30 tablet 0   LORazepam (ATIVAN) 0.5 MG tablet Take 1 tablet (0.5 mg total) by mouth 2 (two) times daily as needed for anxiety. 30 tablet 1   Multiple Vitamin (MULTIVITAMIN) tablet Take 1 tablet by mouth daily.     nitroGLYCERIN (NITROSTAT) 0.4 MG SL tablet Place 1 tablet (0.4 mg total) under the tongue every 5 (five) minutes as needed for chest pain (x3 total. call ems if chest pain does not resolve after one, call ems.). 25 tablet 0   sertraline (ZOLOFT) 100 MG tablet TAKE 1 & 1/2 (ONE & ONE-HALF) TABLETS BY MOUTH ONCE DAILY (Patient taking differently: Take 100 mg by mouth daily.) 135 tablet 0   tiZANidine (ZANAFLEX) 4 MG tablet Take 1 tablet (4 mg total) by mouth every 8 (eight) hours as needed for muscle spasms. 10 tablet 0   Facility-Administered Medications Prior to Visit  Medication Dose Route Frequency Provider Last Rate Last Admin   ipratropium-albuterol (DUONEB) 0.5-2.5 (3) MG/3ML nebulizer solution 3 mL  3 mL Nebulization Once         Allergies  Allergen Reactions   Demerol Anaphylaxis   Meperidine Anaphylaxis   Sulfamethoxazole Itching    With redness to skin    Citalopram Hydrobromide Other (See  Comments)    Unknown  Hydrocodone    Hydrocodone-Acetaminophen Other (See Comments)    Unknown   Metoprolol Tartrate Other (See Comments)    Chest discomfort, palpations   Oxycodone-Acetaminophen Other (See Comments)    Unknown   Rosuvastatin Other (See Comments)    myalgias   Statins     myalgias   Diltiazem Rash   Metoprolol Tartrate Palpitations    Review of Systems  Constitutional:  Negative for appetite change, fatigue and fever.  HENT:  Positive for ear pain (Left ear pain). Negative for congestion, sinus pressure and sore throat.   Respiratory:  Negative for cough, chest tightness, shortness of breath and wheezing.   Cardiovascular:  Negative for chest pain and palpitations.  Gastrointestinal:  Negative for abdominal pain, constipation, diarrhea, nausea and vomiting.  Genitourinary:  Negative for dysuria and hematuria.  Musculoskeletal:  Negative for arthralgias, back pain, joint swelling and myalgias.  Skin:  Negative for rash.  Neurological:  Negative for dizziness, weakness and headaches.  Psychiatric/Behavioral:  Negative for dysphoric mood. The patient is not nervous/anxious.        Objective:        09/04/2023   10:21 AM 09/01/2023    9:04 AM 08/26/2023    9:08 AM  Vitals with BMI  Height 5\' 3"  5\' 3"  5\' 3"   Weight 201 lbs 200 lbs 202 lbs  BMI 35.61 35.44 35.79  Systolic 128 128 454  Diastolic 78 76 70  Pulse 64 76 96    Orthostatic VS for the past 72 hrs (Last 3 readings):  Patient Position BP Location  09/04/23 1021 Sitting Left Arm     Physical Exam Vitals reviewed.  Constitutional:      Appearance: Normal appearance.  HENT:     Right Ear: Hearing, tympanic membrane and ear canal normal.     Left Ear: Hearing and tympanic membrane normal. Tenderness present. No drainage.  No middle ear effusion. There is no impacted cerumen. No foreign body. There is mastoid tenderness. Tympanic membrane is not erythematous or bulging.  Cardiovascular:      Rate and Rhythm: Normal rate and regular rhythm.     Heart sounds: Normal heart sounds.  Pulmonary:     Effort: Pulmonary effort is normal.     Breath sounds: Normal breath sounds.  Abdominal:     General: Bowel sounds are normal.     Palpations: Abdomen is soft.     Tenderness: There is no abdominal tenderness.  Neurological:     Mental Status: She is alert and oriented to person, place, and time.  Psychiatric:        Mood and Affect: Mood normal.        Behavior: Behavior normal.     Health Maintenance Due  Topic Date Due   Zoster Vaccines- Shingrix (1 of 2) Never done   Cervical Cancer Screening (HPV/Pap Cotest)  12/14/2022   Colonoscopy  12/21/2023    There are no preventive care reminders to display for this patient.   Lab Results  Component Value Date   TSH 2.230 07/28/2023   Lab Results  Component Value Date   WBC 6.4 07/28/2023   HGB 14.2 07/28/2023   HCT 42.7 07/28/2023   MCV 96 07/28/2023   PLT 374 07/28/2023   Lab Results  Component Value Date   NA 142 09/01/2023   K 4.5 09/01/2023   CO2 26 09/01/2023   GLUCOSE 77 09/01/2023   BUN 9 09/01/2023   CREATININE 0.47 (L) 09/01/2023   BILITOT 0.4  07/28/2023   ALKPHOS 119 07/28/2023   AST 22 07/28/2023   ALT 14 07/28/2023   PROT 6.6 07/28/2023   ALBUMIN 4.3 07/28/2023   CALCIUM 9.5 09/01/2023   ANIONGAP 10 12/15/2022   EGFR 111 09/01/2023   Lab Results  Component Value Date   CHOL 267 (H) 07/28/2023   Lab Results  Component Value Date   HDL 109 07/28/2023   Lab Results  Component Value Date   LDLCALC 142 (H) 07/28/2023   Lab Results  Component Value Date   TRIG 97 07/28/2023   Lab Results  Component Value Date   CHOLHDL 2.4 07/28/2023   Lab Results  Component Value Date   HGBA1C 5.0 11/23/2020       Assessment & Plan:  Acute swimmer's ear of left side Assessment & Plan: Left ear pain due to otitis externa. Outer ear infection persists despite hydrogen peroxide use. Informed  about risk of infection spreading requiring oral antibiotics if symptoms worsen. - Prescribe topical ear drops for the left ear, 5 drops daily at night for 5 days. - Advise against using Q-tips and inserting headphones in the affected ear. - Instruct to clean headphones to prevent bacterial reintroduction. - Monitor for signs of infection spreading, such as redness and tenderness behind the ear or fever, which would require oral antibiotics.  Orders: -     Ofloxacin; Place 5 drops into the left ear daily.  Dispense: 5 mL; Refill: 2  Seasonal allergies Assessment & Plan: Allergic rhinitis with worsening symptoms. Current medication ineffective. Discussed switching to Claritin. - Switch allergy medication to Claritin and send prescription to the pharmacy.   Orders: -     Loratadine; Take 1 capsule (10 mg total) by mouth daily.  Dispense: 30 capsule; Refill: 3      Meds ordered this encounter  Medications   Loratadine 10 MG CAPS    Sig: Take 1 capsule (10 mg total) by mouth daily.    Dispense:  30 capsule    Refill:  3   ofloxacin (FLOXIN) 0.3 % OTIC solution    Sig: Place 5 drops into the left ear daily.    Dispense:  5 mL    Refill:  2    No orders of the defined types were placed in this encounter.         Follow-up: Return if symptoms worsen or fail to improve.  An After Visit Summary was printed and given to the patient.  Langley Gauss, [facility] [phone]

## 2023-09-04 NOTE — Assessment & Plan Note (Signed)
 Allergic rhinitis with worsening symptoms. Current medication ineffective. Discussed switching to Claritin. - Switch allergy medication to Claritin and send prescription to the pharmacy.

## 2023-09-07 ENCOUNTER — Other Ambulatory Visit: Payer: Self-pay | Admitting: Family Medicine

## 2023-09-07 NOTE — Telephone Encounter (Unsigned)
 Copied from CRM [id number]. Topic: Clinical - Medication Refill >> Sep 07, 2023 11:40 AM Everette C wrote: Most Recent Primary Care Visit:  Provider: CRAFT, BRADY  Department: [facility]  Visit Type: ACUTE  Date: 09/04/2023  Medication: nitroGLYCERIN (NITROSTAT) 0.4 MG SL tablet [[id number]]  amLODipine (NORVASC) 10 MG tablet [[id number]]  Has the patient contacted their pharmacy? Yes (Agent: If no, request that the patient contact the pharmacy for the refill. If patient does not wish to contact the pharmacy document the reason why and proceed with request.) (Agent: If yes, when and what did the pharmacy advise?)  Is this the correct pharmacy for this prescription? Yes If no, delete pharmacy and type the correct one.  This is the patient's preferred pharmacy:  [facility] [address], [place] - [address] [place] [address] Phone: [phone] Fax: [phone]   Has the prescription been filled recently? No  Is the patient out of the medication? Yes  Has the patient been seen for an appointment in the last year OR does the patient have an upcoming appointment? Yes  Can we respond through MyChart? No  Agent: Please be advised that Rx refills may take up to 3 business days. We ask that you follow-up with your pharmacy.

## 2023-09-08 MED ORDER — NITROGLYCERIN 0.4 MG SL SUBL: 0.4000 mg | SUBLINGUAL_TABLET | SUBLINGUAL | 0 refills | Status: AC | PRN

## 2023-09-08 MED ORDER — AMLODIPINE BESYLATE 10 MG PO TABS: 10.0000 mg | ORAL_TABLET | ORAL | 2 refills | Status: DC | PRN

## 2023-09-11 DIAGNOSIS — L814 Other melanin hyperpigmentation: Secondary | ICD-10-CM | POA: Diagnosis not present

## 2023-09-11 DIAGNOSIS — L720 Epidermal cyst: Secondary | ICD-10-CM | POA: Diagnosis not present

## 2023-09-11 DIAGNOSIS — L578 Other skin changes due to chronic exposure to nonionizing radiation: Secondary | ICD-10-CM | POA: Diagnosis not present

## 2023-09-11 DIAGNOSIS — L821 Other seborrheic keratosis: Secondary | ICD-10-CM | POA: Diagnosis not present

## 2023-09-22 DIAGNOSIS — R002 Palpitations: Secondary | ICD-10-CM | POA: Diagnosis not present

## 2023-09-28 ENCOUNTER — Ambulatory Visit: Payer: Medicare HMO | Admitting: Cardiology

## 2023-10-04 ENCOUNTER — Ambulatory Visit (HOSPITAL_BASED_OUTPATIENT_CLINIC_OR_DEPARTMENT_OTHER)
Admission: RE | Admit: 2023-10-04 | Discharge: 2023-10-04 | Disposition: A | Discharge status: Home / Self Care | Source: Ambulatory Visit | Attending: Emergency Medicine | Admitting: Emergency Medicine

## 2023-10-04 ENCOUNTER — Encounter (HOSPITAL_BASED_OUTPATIENT_CLINIC_OR_DEPARTMENT_OTHER): Payer: Self-pay

## 2023-10-04 VITALS — BP 136/87 | HR 56 | Temp 98.4°F | Resp 20

## 2023-10-04 DIAGNOSIS — L720 Epidermal cyst: Secondary | ICD-10-CM

## 2023-10-04 MED ORDER — MUPIROCIN 2 % EX OINT: 1.0000 | TOPICAL_OINTMENT | Freq: Two times a day (BID) | CUTANEOUS | 0 refills | Status: DC

## 2023-10-04 NOTE — ED Triage Notes (Signed)
 States onset of boil/abscess to right ear. Noticed 3 days ago. States needs it lanced.

## 2023-10-04 NOTE — Discharge Instructions (Signed)
 We drained the area to your right ear today.  This appeared to be a cyst.  If this reoccurs please follow-up with your dermatologist.  Keep the area clean and dry, you do a warm compress daily to help any further fluid come out.  Afterwards pat dry and apply the topical antibacterial ointment.  Return to clinic for any signs or symptoms of infection, or any new urgent symptoms develop.

## 2023-10-04 NOTE — ED Provider Notes (Signed)
 [name]    CSN: [id number] Arrival date & time: 10/04/23  1003      History   Chief Complaint Chief Complaint  Patient presents with   Abscess    Boil on top of right ear. - Entered by patient    HPI [name] is a 58 y.o. female.   Patient presents to clinic over concerns of a growing painful area to the right cheek/right ear area.  She did see dermatology for this site a few weeks ago and they told her that it was a cyst.  She does not feel like it is a cyst because it is growing and has gotten more painful.  This has happened a few times before in the same spot.  Without fevers. No drainage from the area.   The history is provided by the patient and medical records.  Abscess   Past Medical History:  Diagnosis Date   Allergic reaction to bee sting 09/09/2018   Anxiety    Asthma    Bacterial vaginosis 07/07/2019   Bulging lumbar disc 07/22/2015   Cardiomyopathy (HCC) 07/18/2015   Overview:  Overview:  Taka T[place]o 2009, normal coronaries  Last Assessment & Plan:  Relevant Hx: Course: Daily Update: Today's Plan: she has recently been seen by cards and is planned to have echo this Friday  Electronically signed by: Carrol Clam, FNP 07/24/15 1042   Carpal tunnel syndrome 07/22/2015   Cervical strain 01/11/2019   Chronic fatigue 07/22/2015   Chronic rhinitis 07/22/2015   Depression    Dyslipidemia 07/18/2015   Esophageal stricture 07/22/2015   Essential hypertension    Eustachian tube dysfunction 07/22/2015   Excessive daytime sleepiness 05/16/2019   Gastro-esophageal reflux disease without esophagitis    Gastroesophageal reflux disease without esophagitis 07/18/2015   Last Assessment & Plan:  Relevant Hx: Course: Daily Update: Today's Plan:appears stable with ppi  Electronically signed by: Carrol Clam, FNP 07/24/15 1043   High risk medication use 07/22/2015   History of blood clots    Hot flash, menopausal 07/22/2015    Hyperlipidemia 07/22/2015   Last Assessment & Plan:  Relevant Hx: Course: Daily Update: Today's Plan:stable with meds  Electronically signed by: Carrol Clam, FNP 07/24/15 1112   Hypertension    Hypertensive heart disease with heart failure (HCC) 04/20/2023   Localized swelling of both lower legs 07/22/2015   Localized swelling of both lower legs 07/22/2015   Low back strain, initial encounter 07/07/2019   Lung mass    Lung nodules    Mixed hyperlipidemia 07/22/2015   Last Assessment & Plan:  Formatting of this note might be different from the original. Relevant Hx: Course: Daily Update: Today's Plan:stable with meds  Electronically signed by: Carrol Clam, FNP 07/24/15 1112   Morbid obesity (HCC) 07/22/2015   Other insomnia 11/22/2018   Pneumonia due to COVID-19 virus 01/17/2020   Primary osteoarthritis involving multiple joints 05/15/2016   Prophylactic antibiotic 08/01/2023   Pulmonary embolus (HCC) 01/18/2020   Right hand pain 07/29/2022   Screening for lung cancer 08/01/2023   Screening for osteoporosis 02/27/2017   Formatting of this note might be different from the original. 2019: spine 3.1, L femur 1.3   Seasonal allergic rhinitis due to pollen 07/22/2015   Sensation of fullness in both ears 08/01/2023   Severe obesity with body mass index (BMI) of 35.0 to 35.9 and comorbidity (HCC) 07/22/2015   Snores 07/22/2015   Last Assessment & Plan:  Relevant Hx:  Course: Daily Update: Today's Plan:she was to have psg, but has not had this yet. Discussed that if this is present could be worse post-op with pain meds. None the less, she will have protective airway with/during surgery. She should still have psg study, but wants to wait until after knee repair  Electronically signed by: Carrol Clam, FNP 07/24/15   Suprapubic pain 08/26/2023   Urinary frequency 05/01/2023   Vertigo 11/25/2021    Patient Active Problem List   Diagnosis Date Noted   Acute  swimmer's ear of left side 09/04/2023   Seasonal allergies 09/04/2023   Suprapubic pain 08/26/2023   Chest pain 08/26/2023   Immunization due 08/26/2023   General medical exam 08/19/2023   History of tobacco use 08/01/2023   Idiopathic chronic gout without tophus 08/01/2023   Prophylactic antibiotic 08/01/2023   Sensation of fullness in both ears 08/01/2023   Screening for lung cancer 08/01/2023   Urinary frequency 05/01/2023   Leg swelling 05/01/2023   Hypertensive heart disease with heart failure (HCC) 04/20/2023   Myalgia due to statin 12/21/2022   Right hand pain 07/29/2022   GAD (generalized anxiety disorder) 11/30/2021   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   Vertigo 11/25/2021   Depression    History of blood clots    Other insomnia 11/22/2018   Primary osteoarthritis involving multiple joints 05/15/2016   Asthma 07/22/2015   Bulging lumbar disc 07/22/2015   Carpal tunnel syndrome 07/22/2015   Chronic fatigue 07/22/2015   Esophageal stricture 07/22/2015   Eustachian tube dysfunction 07/22/2015   Hot flash, menopausal 07/22/2015   Severe obesity with body mass index (BMI) of 35.0 to 35.9 and comorbidity (HCC) 07/22/2015   Seasonal allergic rhinitis due to pollen 07/22/2015   Mixed hyperlipidemia 07/22/2015   Obesity (BMI 30-39.9) 07/22/2015   Cardiomyopathy (HCC) 07/18/2015   Gastroesophageal reflux disease without esophagitis 07/18/2015   Lung nodules    Essential hypertension     Past Surgical History:  Procedure Laterality Date   ABDOMINAL HYSTERECTOMY     CARPAL TUNNEL RELEASE Right    CHOLECYSTECTOMY     ECTOPIC PREGNANCY SURGERY     X 2   FOOT SURGERY     Knee replcement     POLYPECTOMY     TONSILECTOMY/ADENOIDECTOMY WITH MYRINGOTOMY  1972   TOTAL HIP ARTHROPLASTY Right    WISDOM TOOTH EXTRACTION      OB History   No obstetric history on file.      Home Medications    Prior to Admission medications   Medication Sig Start Date End  Date Taking? Authorizing Provider  mupirocin  ointment (BACTROBAN ) 2 % Apply 1 Application topically 2 (two) times daily. 10/04/23  Yes Maliik Karner  N, FNP  albuterol  (VENTOLIN  HFA) 108 (90 Base) MCG/ACT inhaler Inhale 1-2 puffs into the lungs every 6 (six) hours as needed for wheezing or shortness of breath.    [provider]  Albuterol -Budesonide (AIRSUPRA ) 90-80 MCG/ACT AERO Inhale 1-2 puffs into the lungs every 6 (six) hours as needed. 07/28/23   CoxBurleigh Carp, MD  amLODipine  (NORVASC ) 10 MG tablet Take 1 tablet (10 mg total) by mouth as needed (for high blood pressure). 09/08/23   CoxBurleigh Carp, MD  amoxicillin  (AMOXIL ) 500 MG capsule 4 capsules one hour prior to dental work. 07/28/23   Mercy Stall, MD  aspirin EC 81 MG tablet Take 81 mg by mouth daily. Swallow whole.    [provider]  EPINEPHrine  0.3 mg/0.3 mL IJ SOAJ injection  Inject 0.3 mg into the muscle as needed for anaphylaxis. 04/20/23   CoxBurleigh Carp, MD  ezetimibe  (ZETIA ) 10 MG tablet Take 1 tablet (10 mg total) by mouth daily. 08/31/23   Cox, Burleigh Carp, MD  fluticasone (FLONASE) 50 MCG/ACT nasal spray Place 1 spray into both nostrils daily. 03/13/16   [provider]  Fluticasone-Umeclidin-Vilant (TRELEGY ELLIPTA ) 200-62.5-25 MCG/ACT AEPB Inhale 1 Inhalation into the lungs daily. 04/17/22   Allegra Arch, NP  furosemide  (LASIX ) 20 MG tablet Take 1 tablet (20 mg total) by mouth as needed for fluid or edema. 11/25/21   CoxBurleigh Carp, MD  ibuprofen  (ADVIL ) 600 MG tablet Take 1 tablet (600 mg total) by mouth every 6 (six) hours as needed. 12/15/22   Spence Dux, PA-C  Loratadine  10 MG CAPS Take 1 capsule (10 mg total) by mouth daily. 09/04/23   Odilia Bennett, PA  LORazepam  (ATIVAN ) 0.5 MG tablet Take 1 tablet (0.5 mg total) by mouth 2 (two) times daily as needed for anxiety. 08/26/23   Janece Means, FNP  Multiple Vitamin (MULTIVITAMIN) tablet Take 1 tablet by mouth daily.    [provider]  nitroGLYCERIN   (NITROSTAT ) 0.4 MG SL tablet Place 1 tablet (0.4 mg total) under the tongue every 5 (five) minutes as needed for chest pain (x3 total. call ems if chest pain does not resolve after one, call ems.). 09/08/23   CoxBurleigh Carp, MD  ofloxacin  (FLOXIN ) 0.3 % OTIC solution Place 5 drops into the left ear daily. 09/04/23   Odilia Bennett, PA  sertraline  (ZOLOFT ) 100 MG tablet TAKE 1 & 1/2 (ONE & ONE-HALF) TABLETS BY MOUTH ONCE DAILY Patient taking differently: Take 100 mg by mouth daily. 06/21/23   CoxBurleigh Carp, MD  tiZANidine  (ZANAFLEX ) 4 MG tablet Take 1 tablet (4 mg total) by mouth every 8 (eight) hours as needed for muscle spasms. 12/15/22   Spence Dux, PA-C    Family History Family History  Problem Relation Age of Onset   Dementia Mother    Heart attack Father    Heart disease Father    Leukemia Half-Brother    Multiple myeloma Half-Brother    Diabetes Half-Sister    Breast cancer Neg Hx     Social History Social History   Tobacco Use   Smoking status: Former    Current packs/day: 0.00    Types: Cigarettes    Quit date: 06/17/2015    Years since quitting: 8.3   Smokeless tobacco: Never  Vaping Use   Vaping status: Never Used  Substance Use Topics   Alcohol use: Yes    Alcohol/week: 3.0 - 4.0 standard drinks of alcohol    Types: 3 - 4 Cans of beer per week    Comment: every other day   Drug use: No     Allergies   Demerol, Meperidine, Sulfamethoxazole, Citalopram hydrobromide, Hydrocodone, Hydrocodone-acetaminophen, Metoprolol tartrate, Oxycodone-acetaminophen, Rosuvastatin , Statins, Diltiazem, and Metoprolol tartrate   Review of Systems Review of Systems  Per HPI  Physical Exam Triage Vital Signs ED Triage Vitals  Encounter Vitals Group     BP 10/04/23 1031 136/87     Systolic BP Percentile --      Diastolic BP Percentile --      Pulse Rate 10/04/23 1031 (!) 56     Resp 10/04/23 1031 20     Temp 10/04/23 1031 98.4 F (36.9 C)     Temp Source 10/04/23 1031 Oral      SpO2 10/04/23 1031 97 %  Weight --      Height --      Head Circumference --      Peak Flow --      Pain Score 10/04/23 1033 3     Pain Loc --      Pain Education --      Exclude from Growth Chart --    No data found.  Updated Vital Signs BP 136/87 (BP Location: Left Arm)   Pulse (!) 56   Temp 98.4 F (36.9 C) (Oral)   Resp 20   LMP  (LMP Unknown)   SpO2 97%   Visual Acuity Right Eye Distance:   Left Eye Distance:   Bilateral Distance:    Right Eye Near:   Left Eye Near:    Bilateral Near:     Physical Exam Vitals and nursing note reviewed.  Constitutional:      Appearance: Normal appearance.  HENT:     Head: Normocephalic and atraumatic.      Right Ear: External ear normal.     Left Ear: External ear normal.     Nose: Nose normal.     Mouth/Throat:     Mouth: Mucous membranes are moist.  Eyes:     Conjunctiva/sclera: Conjunctivae normal.  Cardiovascular:     Rate and Rhythm: Normal rate.  Pulmonary:     Effort: Pulmonary effort is normal. No respiratory distress.  Skin:    General: Skin is warm and dry.     Findings: Lesion present.  Neurological:     General: No focal deficit present.     Mental Status: She is alert.  Psychiatric:        Mood and Affect: Mood normal.      UC Treatments / Results  Labs (all labs ordered are listed, but only abnormal results are displayed) Labs Reviewed - No data to display  EKG   Radiology No results found.  Procedures Incision and Drainage  Date/Time: 10/04/2023 11:26 AM  Performed by: Renae Carney, FNP Authorized by: Harlow Lighter, Nikitia Asbill  Neale Bale, FNP   Consent:    Consent obtained:  Verbal   Consent given by:  Patient   Risks, benefits, and alternatives were discussed: yes     Risks discussed:  Bleeding, incomplete drainage and pain   Alternatives discussed:  No treatment and delayed treatment Universal protocol:    Procedure explained and questions answered to patient or proxy's satisfaction:  yes     Patient identity confirmed:  Verbally with patient Location:    Type:  Cyst   Size:  2cm x 2cm   Location:  Head   Head location:  R external ear Pre-procedure details:    Skin preparation:  Povidone-iodine and antiseptic wash Anesthesia:    Anesthesia method:  Local infiltration   Local anesthetic:  Lidocaine  1% WITH epi Procedure details:    Ultrasound guidance: no     Incision types:  Single straight   Incision depth:  Dermal   Wound management:  Probed and deloculated   Drainage characteristics: white, thick drainage.   Drainage amount:  Moderate   Wound treatment:  Wound left open   Packing materials:  None Post-procedure details:    Procedure completion:  Tolerated well, no immediate complications  (including critical care time)  Medications Ordered in UC Medications - No data to display  Initial Impression / Assessment and Plan / UC Course  I have reviewed the triage vital signs and the nursing notes.  Pertinent labs & imaging results  that were available during my care of the patient were reviewed by me and considered in my medical decision making (see chart for details).  Vitals in triage reviewed, patient is hemodynamically stable.  Appears to have a cyst to the right cheek/right upper anterior ear area.  Area is growing and tender, RBA of I&D discussed, patient opted to move forward.  Area cleaned, numbed, drained and deloculated.  Discussed that this is a cyst that it may recur and that she should follow-up with dermatology.  No obvious infection, oral antibiotics deferred.  Wound management topical antibiotics discussed.  Plan of care, follow-up care return precautions given, no questions at this time.    Final Clinical Impressions(s) / UC Diagnoses   Final diagnoses:  Epidermal cyst of face     Discharge Instructions      We drained the area to your right ear today.  This appeared to be a cyst.  If this reoccurs please follow-up with your  dermatologist.  Keep the area clean and dry, you do a warm compress daily to help any further fluid come out.  Afterwards pat dry and apply the topical antibacterial ointment.  Return to clinic for any signs or symptoms of infection, or any new urgent symptoms develop.    ED Prescriptions     Medication Sig Dispense Auth. Provider   mupirocin  ointment (BACTROBAN ) 2 % Apply 1 Application topically 2 (two) times daily. 22 g Harlow Lighter, Alexias Margerum  N, FNP      PDMP not reviewed this encounter.   Harlow Lighter, Karelyn Brisby  N, FNP 10/04/23 [phone]

## 2023-10-12 DIAGNOSIS — R002 Palpitations: Secondary | ICD-10-CM | POA: Diagnosis not present

## 2023-10-15 ENCOUNTER — Telehealth: Payer: Self-pay | Admitting: Emergency Medicine

## 2023-10-15 NOTE — Telephone Encounter (Signed)
-----   Message from Terrance Ferretti sent at 10/13/2023  7:38 AM EDT ----- Monitor revealed that on average her heart rate is 83 bpm, this is normal.  She was mostly in sinus rhythm.  She did have 7 episodes where her heart speeds up, this is likely what had been bothering her.  This is not anything overly concerning from a cardiac perspective and we would not necessarily treat this unless the patient is very bothered by this and request to do so.  If she would like a medication for this we could give her metoprolol tartrate 12.5 mg twice daily, but if she is comfortable just knowing that this is relatively benign she does not have to take this medication.

## 2023-10-15 NOTE — Telephone Encounter (Signed)
 Attempted to call patient x3, call could not be completed as dialed

## 2023-10-16 NOTE — Telephone Encounter (Signed)
 Called and spoke to patient.  Reviewed monitor results and recommendations with patient as per [facility] note. Patient states that her palpations have gotten better and she does not wish to start any medication at this time. She verbalized understanding and had no further questions.

## 2023-10-21 ENCOUNTER — Encounter (HOSPITAL_COMMUNITY): Payer: Self-pay

## 2023-10-27 NOTE — Progress Notes (Signed)
 Subjective:  Patient ID: [name], female    DOB: 31-Mar-1966  Age: 58 y.o. MRN: [id number]  Chief Complaint  Patient presents with   Medical Management of Chronic Issues    HPI: Lipid/Cholesterol, Follow-up She was last seen for this 4 months ago.  Management since that visit includes Zetia  and krill oil. Intolerant to crestor  and lipitor.     She is eating healthy. Not exercising.   Hypertension, follow-up: Management since that visit includes amlodipine  10 mg daily.     She is following a Regular diet. She is not exercising, but remodeling her home.  She does not smoke.  Anxiety/Depression, Follow-up She was last seen for anxiety 4 months ago. Currently on zoloft  150 mg daily. Never started wellbutrin  xl. On lorazepam  0.5 mg once daily as needed severe anxiety.   Patient reports nmerous stressors. Having panic attacks. Patient was issued jury duty notice and patient is very anxious and upset and does not feel like she can do it. Requesting a letter excusing her.  GERD, Follow up: The patient was last seen for GERD. On no medicine. She is NOT experiencing chest pain, cough, or difficulty swallowing.  Asthma: using albuterol  hfa infrequently.   Palpitations: 14 day holter monitor was essentially normal. Denies chest pain. Denies dizziness.     10/28/2023    9:11 AM 08/26/2023    9:21 AM 08/13/2023   10:51 AM 04/20/2023    9:02 AM 12/17/2022    8:52 AM  Depression screen PHQ 2/9  Decreased Interest 1 2 0 1 0  Down, Depressed, Hopeless 1 2 0 1 1  PHQ - 2 Score 2 4 0 2 1  Altered sleeping 0 3 1 1 1   Tired, decreased energy 1 2 0 1 1  Change in appetite 0 2 0 0 0  Feeling bad or failure about yourself  0 1 0 0 0  Trouble concentrating 0 2 0 2 0  Moving slowly or fidgety/restless  2 0 3 1  Suicidal thoughts 0 0 0 0 0  PHQ-9 Score 3 16 1 9 4   Difficult doing work/chores Somewhat difficult Somewhat difficult Not difficult at all Somewhat difficult Not difficult at all         10/28/2023    9:11 AM  Fall Risk   Falls in the past year? 1  Number falls in past yr: 0  Injury with Fall? 0  Risk for fall due to : No Fall Risks  Follow up Falls evaluation completed    Patient Care Team: Mercy Stall, MD as PCP - General (Family Medicine) Manfred Seed, MD as PCP - Cardiology (Cardiology) Manfred Seed, MD as Consulting Physician (Cardiology) Gregoria Leas, OD as Consulting Physician (Optometry)   Review of Systems  Constitutional:  Negative for chills, fatigue and fever.  HENT:  Positive for rhinorrhea. Negative for congestion, ear pain and sore throat.   Respiratory:  Negative for cough and shortness of breath.   Cardiovascular:  Negative for chest pain.  Gastrointestinal:  Negative for abdominal pain, constipation, diarrhea, nausea and vomiting.  Genitourinary:  Negative for dysuria and urgency.  Musculoskeletal:  Negative for back pain and myalgias.  Neurological:  Negative for dizziness, weakness, light-headedness and headaches.  Psychiatric/Behavioral:  Negative for dysphoric mood, sleep disturbance and suicidal ideas. The patient is nervous/anxious.     Current Outpatient Medications on File Prior to Visit  Medication Sig Dispense Refill   albuterol  (VENTOLIN  HFA) 108 (90 Base) MCG/ACT inhaler  Inhale 1-2 puffs into the lungs every 6 (six) hours as needed for wheezing or shortness of breath.     Albuterol -Budesonide (AIRSUPRA ) 90-80 MCG/ACT AERO Inhale 1-2 puffs into the lungs every 6 (six) hours as needed. 10.7 g 3   amLODipine  (NORVASC ) 10 MG tablet Take 1 tablet (10 mg total) by mouth as needed (for high blood pressure). 30 tablet 2   aspirin EC 81 MG tablet Take 81 mg by mouth daily. Swallow whole.     EPINEPHrine  0.3 mg/0.3 mL IJ SOAJ injection Inject 0.3 mg into the muscle as needed for anaphylaxis. 2 each 1   ezetimibe  (ZETIA ) 10 MG tablet Take 1 tablet (10 mg total) by mouth daily. 90 tablet 1   fluticasone (FLONASE) 50 MCG/ACT  nasal spray Place 1 spray into both nostrils daily.     Fluticasone-Umeclidin-Vilant (TRELEGY ELLIPTA ) 200-62.5-25 MCG/ACT AEPB Inhale 1 Inhalation into the lungs daily. 1 each 11   furosemide  (LASIX ) 20 MG tablet Take 1 tablet (20 mg total) by mouth as needed for fluid or edema. 90 tablet 0   ibuprofen  (ADVIL ) 600 MG tablet Take 1 tablet (600 mg total) by mouth every 6 (six) hours as needed. 30 tablet 0   Loratadine  10 MG CAPS Take 1 capsule (10 mg total) by mouth daily. 30 capsule 3   Multiple Vitamin (MULTIVITAMIN) tablet Take 1 tablet by mouth daily.     mupirocin  ointment (BACTROBAN ) 2 % Apply 1 Application topically 2 (two) times daily. 22 g 0   nitroGLYCERIN  (NITROSTAT ) 0.4 MG SL tablet Place 1 tablet (0.4 mg total) under the tongue every 5 (five) minutes as needed for chest pain (x3 total. call ems if chest pain does not resolve after one, call ems.). 25 tablet 0   sertraline  (ZOLOFT ) 100 MG tablet TAKE 1 & 1/2 (ONE & ONE-HALF) TABLETS BY MOUTH ONCE DAILY (Patient taking differently: Take 100 mg by mouth daily.) 135 tablet 0   No current facility-administered medications on file prior to visit.   Past Medical History:  Diagnosis Date   Allergic reaction to bee sting 09/09/2018   Anxiety    Asthma    Bacterial vaginosis 07/07/2019   Bulging lumbar disc 07/22/2015   Cardiomyopathy (HCC) 07/18/2015   Overview:  Overview:  [place] 2009, normal coronaries  Last Assessment & Plan:  Relevant Hx: Course: Daily Update: Today's Plan: she has recently been seen by cards and is planned to have echo this Friday  Electronically signed by: Carrol Clam, FNP 07/24/15 1042   Carpal tunnel syndrome 07/22/2015   Cervical strain 01/11/2019   Chronic fatigue 07/22/2015   Chronic rhinitis 07/22/2015   Depression    Dyslipidemia 07/18/2015   Esophageal stricture 07/22/2015   Essential hypertension    Eustachian tube dysfunction 07/22/2015   Excessive daytime sleepiness 05/16/2019    Gastro-esophageal reflux disease without esophagitis    Gastroesophageal reflux disease without esophagitis 07/18/2015   Last Assessment & Plan:  Relevant Hx: Course: Daily Update: Today's Plan:appears stable with ppi  Electronically signed by: Carrol Clam, FNP 07/24/15 1043   High risk medication use 07/22/2015   History of blood clots    Hot flash, menopausal 07/22/2015   Hyperlipidemia 07/22/2015   Last Assessment & Plan:  Relevant Hx: Course: Daily Update: Today's Plan:stable with meds  Electronically signed by: Carrol Clam, FNP 07/24/15 1112   Hypertension    Hypertensive heart disease with heart failure (HCC) 04/20/2023   Localized swelling of both lower legs 07/22/2015  Localized swelling of both lower legs 07/22/2015   Low back strain, initial encounter 07/07/2019   Lung mass    Lung nodules    Mixed hyperlipidemia 07/22/2015   Last Assessment & Plan:  Formatting of this note might be different from the original. Relevant Hx: Course: Daily Update: Today's Plan:stable with meds  Electronically signed by: Carrol Clam, FNP 07/24/15 1112   Morbid obesity (HCC) 07/22/2015   Other insomnia 11/22/2018   Pneumonia due to COVID-19 virus 01/17/2020   Primary osteoarthritis involving multiple joints 05/15/2016   Prophylactic antibiotic 08/01/2023   Pulmonary embolus (HCC) 01/18/2020   Right hand pain 07/29/2022   Screening for lung cancer 08/01/2023   Screening for osteoporosis 02/27/2017   Formatting of this note might be different from the original. 2019: spine 3.1, L femur 1.3   Seasonal allergic rhinitis due to pollen 07/22/2015   Sensation of fullness in both ears 08/01/2023   Severe obesity with body mass index (BMI) of 35.0 to 35.9 and comorbidity (HCC) 07/22/2015   Snores 07/22/2015   Last Assessment & Plan:  Relevant Hx: Course: Daily Update: Today's Plan:she was to have psg, but has not had this yet. Discussed that if this is present could be  worse post-op with pain meds. None the less, she will have protective airway with/during surgery. She should still have psg study, but wants to wait until after knee repair  Electronically signed by: Carrol Clam, FNP 07/24/15   Suprapubic pain 08/26/2023   Urinary frequency 05/01/2023   Vertigo 11/25/2021   Past Surgical History:  Procedure Laterality Date   ABDOMINAL HYSTERECTOMY     CARPAL TUNNEL RELEASE Right    CHOLECYSTECTOMY     ECTOPIC PREGNANCY SURGERY     X 2   FOOT SURGERY     Knee replcement     POLYPECTOMY     TONSILECTOMY/ADENOIDECTOMY WITH MYRINGOTOMY  1972   TOTAL HIP ARTHROPLASTY Right    WISDOM TOOTH EXTRACTION      Family History  Problem Relation Age of Onset   Dementia Mother    Heart attack Father    Heart disease Father    Leukemia Half-Brother    Multiple myeloma Half-Brother    Diabetes Half-Sister    Breast cancer Neg Hx    Social History   Socioeconomic History   Marital status: Divorced    Spouse name: NONE   Number of children: 0   Years of education: 12 +  [employer]   Highest education level: Some college, no degree  Occupational History   Occupation: DIABLED  Tobacco Use   Smoking status: Former    Current packs/day: 0.00    Types: Cigarettes    Quit date: 06/17/2015    Years since quitting: 8.4   Smokeless tobacco: Never  Vaping Use   Vaping status: Never Used  Substance and Sexual Activity   Alcohol use: Yes    Alcohol/week: 3.0 - 4.0 standard drinks of alcohol    Types: 3 - 4 Cans of beer per week    Comment: every other day   Drug use: No   Sexual activity: Yes    Partners: Male  Other Topics Concern   Not on file  Social History Narrative   Not on file   Social Drivers of Health   Financial Resource Strain: Low Risk  (08/13/2023)   Overall Financial Resource Strain (CARDIA)    Difficulty of Paying Living Expenses: Not hard at all  Recent Concern: Financial  Resource Strain - High Risk (07/27/2023)    Overall Financial Resource Strain (CARDIA)    Difficulty of Paying Living Expenses: Hard  Food Insecurity: No Food Insecurity (08/13/2023)   Hunger Vital Sign    Worried About Running Out of Food in the Last Year: Never true    Ran Out of Food in the Last Year: Never true  Transportation Needs: No Transportation Needs (08/13/2023)   PRAPARE - Administrator, Civil Service (Medical): No    Lack of Transportation (Non-Medical): No  Physical Activity: Sufficiently Active (08/13/2023)   Exercise Vital Sign    Days of Exercise per Week: 3 days    Minutes of Exercise per Session: 60 min  Stress: Stress Concern Present (08/13/2023)   [employer] of Occupational Health - Occupational Stress Questionnaire    Feeling of Stress : To some extent  Social Connections: Moderately Isolated (08/13/2023)   Social Connection and Isolation Panel [NHANES]    Frequency of Communication with Friends and Family: More than three times a week    Frequency of Social Gatherings with Friends and Family: Once a week    Attends Religious Services: 1 to 4 times per year    Active Member of [employer] or Organizations: No    Attends Engineer, structural: Never    Marital Status: Divorced    Objective:  BP 136/80   Pulse 77   Temp 98.2 F (36.8 C)   Ht 5\' 3"  (1.6 m)   Wt 203 lb (92.1 kg)   LMP  (LMP Unknown)   SpO2 97%   BMI 35.96 kg/m      10/28/2023    8:06 AM 10/04/2023   10:31 AM 09/04/2023   10:21 AM  BP/Weight  Systolic BP 136 136 128  Diastolic BP 80 87 78  Wt. (Lbs) 203  201  BMI 35.96 kg/m2  35.61 kg/m2    Physical Exam Vitals reviewed.  Constitutional:      Appearance: Normal appearance. She is obese.  Neck:     Vascular: No carotid bruit.  Cardiovascular:     Rate and Rhythm: Normal rate and regular rhythm.     Heart sounds: Normal heart sounds.  Pulmonary:     Effort: Pulmonary effort is normal. No respiratory distress.     Breath sounds: Normal breath sounds.   Abdominal:     General: Abdomen is flat. Bowel sounds are normal.     Palpations: Abdomen is soft.     Tenderness: There is no abdominal tenderness.  Neurological:     Mental Status: She is alert and oriented to person, place, and time.  Psychiatric:        Behavior: Behavior normal.     Comments: Rapid speech. Severe anxiety.      Diabetic Foot Exam - Simple   No data filed      Lab Results  Component Value Date   WBC 5.8 10/28/2023   HGB 14.1 10/28/2023   HCT 42.1 10/28/2023   PLT 329 10/28/2023   GLUCOSE 91 10/28/2023   CHOL 269 (H) 10/28/2023   TRIG 86 10/28/2023   HDL 108 10/28/2023   LDLCALC 147 (H) 10/28/2023   ALT 15 10/28/2023   AST 23 10/28/2023   NA 143 10/28/2023   K 4.7 10/28/2023   CL 101 10/28/2023   CREATININE 0.62 10/28/2023   BUN 8 10/28/2023   CO2 25 10/28/2023   TSH 2.230 07/28/2023   HGBA1C 5.0 11/23/2020  Assessment & Plan:  Hypertensive heart disease with heart failure [facility]) Assessment & Plan: Well controlled.  No changes to medicines. On amlodipine  10 mg daily. Continue to work on eating a healthy diet and exercise.  Labs drawn today.    Orders: -     CBC with Differential/Platelet -     Comprehensive metabolic panel with GFR  Gastroesophageal reflux disease without esophagitis Assessment & Plan: Stable without medication.   Mixed hyperlipidemia Assessment & Plan: Well controlled. Continue zetia  10 mg daily and Krill oil. Continue to work on eating a healthy diet and exercise.  Labs drawn today.  Intolerant to statins.   Orders: -     Lipid panel  Mild recurrent major depression (HCC) Assessment & Plan: The current medical regimen is effective;  continue present plan and medications. Continue zoloft  150 mg daily.    GAD (generalized anxiety disorder) Assessment & Plan: The current medical regimen is effective;  continue present plan and medications.  On lorazepam  0.5 mg once daily as needed severe anxiety.    Orders: -     LORazepam ; Take 1 tablet (0.5 mg total) by mouth 2 (two) times daily as needed for anxiety.  Dispense: 30 tablet; Refill: 1  Severe obesity with body mass index (BMI) of 35.0 to 35.9 and comorbidity (HCC) Assessment & Plan: Recommend continue to work on eating healthy diet and exercise. Comorbidities: Hyperlipidemia and hypertension.      Meds ordered this encounter  Medications   LORazepam  (ATIVAN ) 0.5 MG tablet    Sig: Take 1 tablet (0.5 mg total) by mouth 2 (two) times daily as needed for anxiety.    Dispense:  30 tablet    Refill:  1    Orders Placed This Encounter  Procedures   CBC with Differential/Platelet   Comprehensive metabolic panel with GFR   Lipid panel     Follow-up: Return in about 3 months (around 01/28/2024) for chronic follow up.   I,Marla I Leal-Borjas,acting as a scribe for Mercy Stall, MD.,have documented all relevant documentation on the behalf of Mercy Stall, MD,as directed by  Mercy Stall, MD while in the presence of Mercy Stall, MD.   An After Visit Summary was printed and given to the patient.  I attest that I have reviewed this visit and agree with the plan scribed by my staff.   Mercy Stall, MD [facility] [phone]

## 2023-10-28 ENCOUNTER — Ambulatory Visit (INDEPENDENT_AMBULATORY_CARE_PROVIDER_SITE_OTHER): Payer: Medicare HMO | Admitting: Family Medicine

## 2023-10-28 ENCOUNTER — Encounter: Payer: Self-pay | Admitting: Family Medicine

## 2023-10-28 VITALS — BP 136/80 | HR 77 | Temp 98.2°F | Ht 63.0 in | Wt 203.0 lb

## 2023-10-28 DIAGNOSIS — E782 Mixed hyperlipidemia: Secondary | ICD-10-CM

## 2023-10-28 DIAGNOSIS — F330 Major depressive disorder, recurrent, mild: Secondary | ICD-10-CM | POA: Diagnosis not present

## 2023-10-28 DIAGNOSIS — F411 Generalized anxiety disorder: Secondary | ICD-10-CM

## 2023-10-28 DIAGNOSIS — K219 Gastro-esophageal reflux disease without esophagitis: Secondary | ICD-10-CM | POA: Diagnosis not present

## 2023-10-28 DIAGNOSIS — I110 Hypertensive heart disease with heart failure: Secondary | ICD-10-CM | POA: Diagnosis not present

## 2023-10-28 DIAGNOSIS — Z6835 Body mass index (BMI) 35.0-35.9, adult: Secondary | ICD-10-CM

## 2023-10-28 MED ORDER — LORAZEPAM 0.5 MG PO TABS: 0.5000 mg | ORAL_TABLET | Freq: Two times a day (BID) | ORAL | 1 refills | Status: AC | PRN

## 2023-10-29 ENCOUNTER — Ambulatory Visit: Payer: Self-pay | Admitting: Family Medicine

## 2023-10-29 LAB — LIPID PANEL
Chol/HDL Ratio: 2.5 ratio (ref 0.0–4.4)
Cholesterol, Total: 269 mg/dL — ABNORMAL HIGH (ref 100–199)
HDL: 108 mg/dL (ref >39)
LDL Chol Calc (NIH): 147 mg/dL — ABNORMAL HIGH (ref 0–99)
Triglycerides: 86 mg/dL (ref 0–149)
VLDL Cholesterol Cal: 14 mg/dL (ref 5–40)

## 2023-10-29 LAB — COMPREHENSIVE METABOLIC PANEL WITH GFR
ALT: 15 IU/L (ref 0–32)
AST: 23 IU/L (ref 0–40)
Albumin: 4.5 g/dL (ref 3.8–4.9)
Alkaline Phosphatase: 148 IU/L — ABNORMAL HIGH (ref 44–121)
BUN/Creatinine Ratio: 13 (ref 9–23)
BUN: 8 mg/dL (ref 6–24)
Bilirubin Total: 0.5 mg/dL (ref 0.0–1.2)
CO2: 25 mmol/L (ref 20–29)
Calcium: 9.4 mg/dL (ref 8.7–10.2)
Chloride: 101 mmol/L (ref 96–106)
Creatinine, Ser: 0.62 mg/dL (ref 0.57–1.00)
Globulin, Total: 2.3 g/dL (ref 1.5–4.5)
Glucose: 91 mg/dL (ref 70–99)
Potassium: 4.7 mmol/L (ref 3.5–5.2)
Sodium: 143 mmol/L (ref 134–144)
Total Protein: 6.8 g/dL (ref 6.0–8.5)
eGFR: 104 mL/min/{1.73_m2} (ref >59)

## 2023-10-29 LAB — CBC WITH DIFFERENTIAL/PLATELET
Basophils Absolute: 0.1 10*3/uL (ref 0.0–0.2)
Basos: 1 %
EOS (ABSOLUTE): 0.1 10*3/uL (ref 0.0–0.4)
Eos: 1 %
Hematocrit: 42.1 % (ref 34.0–46.6)
Hemoglobin: 14.1 g/dL (ref 11.1–15.9)
Immature Grans (Abs): 0 10*3/uL (ref 0.0–0.1)
Immature Granulocytes: 0 %
Lymphocytes Absolute: 1.7 10*3/uL (ref 0.7–3.1)
Lymphs: 28 %
MCH: 33.3 pg — ABNORMAL HIGH (ref 26.6–33.0)
MCHC: 33.5 g/dL (ref 31.5–35.7)
MCV: 100 fL — ABNORMAL HIGH (ref 79–97)
Monocytes Absolute: 0.5 10*3/uL (ref 0.1–0.9)
Monocytes: 8 %
Neutrophils Absolute: 3.6 10*3/uL (ref 1.4–7.0)
Neutrophils: 62 %
Platelets: 329 10*3/uL (ref 150–450)
RBC: 4.23 x10E6/uL (ref 3.77–5.28)
RDW: 12.1 % (ref 11.7–15.4)
WBC: 5.8 10*3/uL (ref 3.4–10.8)

## 2023-10-31 NOTE — Assessment & Plan Note (Signed)
 The current medical regimen is effective;  continue present plan and medications.  On lorazepam  0.5 mg once daily as needed severe anxiety.

## 2023-10-31 NOTE — Assessment & Plan Note (Signed)
 Well controlled. Continue zetia  10 mg daily and Krill oil. Continue to work on eating a healthy diet and exercise.  Labs drawn today.  Intolerant to statins.

## 2023-10-31 NOTE — Assessment & Plan Note (Signed)
The current medical regimen is effective;  continue present plan and medications. Continue zoloft 150 mg daily.

## 2023-10-31 NOTE — Assessment & Plan Note (Signed)
 Stable without medication

## 2023-10-31 NOTE — Assessment & Plan Note (Signed)
 Well controlled.  No changes to medicines. On amlodipine  10 mg daily. Continue to work on eating a healthy diet and exercise.  Labs drawn today.

## 2023-11-04 ENCOUNTER — Other Ambulatory Visit: Payer: Self-pay

## 2023-11-04 MED ORDER — NEXLIZET 180-10 MG PO TABS: 1.0000 | ORAL_TABLET | Freq: Every day | ORAL | 1 refills | Status: AC

## 2023-11-08 NOTE — Assessment & Plan Note (Signed)
 Recommend continue to work on eating healthy diet and exercise. Comorbidities: Hyperlipidemia and hypertension.

## 2023-11-13 ENCOUNTER — Ambulatory Visit (HOSPITAL_BASED_OUTPATIENT_CLINIC_OR_DEPARTMENT_OTHER)
Admit: 2023-11-13 | Discharge: 2023-11-13 | Disposition: A | Discharge status: Home / Self Care | Attending: Family Medicine | Admitting: Family Medicine

## 2023-11-13 ENCOUNTER — Other Ambulatory Visit: Payer: Self-pay | Admitting: Physician Assistant

## 2023-11-13 ENCOUNTER — Encounter (HOSPITAL_BASED_OUTPATIENT_CLINIC_OR_DEPARTMENT_OTHER): Payer: Self-pay | Admitting: Emergency Medicine

## 2023-11-13 ENCOUNTER — Ambulatory Visit (HOSPITAL_BASED_OUTPATIENT_CLINIC_OR_DEPARTMENT_OTHER)
Admission: EM | Admit: 2023-11-13 | Discharge: 2023-11-13 | Disposition: A | Discharge status: Home / Self Care | Attending: Family Medicine | Admitting: Family Medicine

## 2023-11-13 ENCOUNTER — Ambulatory Visit (HOSPITAL_BASED_OUTPATIENT_CLINIC_OR_DEPARTMENT_OTHER): Payer: Self-pay | Admitting: Family Medicine

## 2023-11-13 ENCOUNTER — Other Ambulatory Visit (HOSPITAL_BASED_OUTPATIENT_CLINIC_OR_DEPARTMENT_OTHER): Payer: Self-pay

## 2023-11-13 DIAGNOSIS — S0502XA Injury of conjunctiva and corneal abrasion without foreign body, left eye, initial encounter: Secondary | ICD-10-CM

## 2023-11-13 DIAGNOSIS — S0012XA Contusion of left eyelid and periocular area, initial encounter: Secondary | ICD-10-CM

## 2023-11-13 DIAGNOSIS — S0990XA Unspecified injury of head, initial encounter: Secondary | ICD-10-CM

## 2023-11-13 DIAGNOSIS — R519 Headache, unspecified: Secondary | ICD-10-CM

## 2023-11-13 DIAGNOSIS — S0993XA Unspecified injury of face, initial encounter: Secondary | ICD-10-CM | POA: Diagnosis not present

## 2023-11-13 DIAGNOSIS — H5712 Ocular pain, left eye: Secondary | ICD-10-CM

## 2023-11-13 MED ORDER — MUPIROCIN 2 % EX OINT
1.0000 | TOPICAL_OINTMENT | Freq: Two times a day (BID) | CUTANEOUS | 0 refills | Status: AC
  Filled 2023-11-13: qty 22, 22d supply, fill #0

## 2023-11-13 MED ORDER — ERYTHROMYCIN 5 MG/GM OP OINT
TOPICAL_OINTMENT | OPHTHALMIC | 0 refills | Status: AC
  Filled 2023-11-13: qty 3.5, 7d supply, fill #0

## 2023-11-13 NOTE — Discharge Instructions (Addendum)
 Corneal abrasion: Use erythromycin ointment to the left eye twice daily for 5 to 7 days.  Follow-up if symptoms do not improve  Headache and concern for concussion: Patient has a good neuro-exam but she feels intermittently dizzy and worried she has a concussion.  I explained that concussion is possible even though she has a good neuroexam.  If her headaches persist or she feels like she has a concussion she can return here or go to an emergency room.  She may need a head CT scan which would be able to be done at an emergency room.  Follow-up with family doctor as needed.  Facial pain: X-rays appear negative for facial bone fracture.  Follow-up as needed.  Return here or see primary care if symptoms do not improve, worsen or new symptoms occur.

## 2023-11-13 NOTE — Progress Notes (Signed)
 Facial bones X-Ray was negative.  Patient updated.  Patient reports she is feeling some better after starting the eye ointment.
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
## 2023-11-13 NOTE — ED Provider Notes (Signed)
 [name]    CSN: [id number] Arrival date & time: 11/13/23  1103      History   Chief Complaint Chief Complaint  Patient presents with   Eye Injury    HPI [name] is a 58 y.o. female.   Patient reports that last night, on part of her property that is a detached apartment, she slipped and fell forward and hit a fence post that same metal chain-link fence post.  Her eye impacted the top, uncovered tip of a 2 inch round solid metal fence post.  She did not seek care last night.  She is got some blurred vision in the left eye.  She never had loss of consciousness.  She has not had nausea or vomiting.  She is concerned she might have a concussion.  She has a 2 mm laceration on the upper eyelid that has crusted closed.  Her left upper and lower eyelids are swollen almost shut and are purple with ecchymosis.  She worries that she has something wrong with the eye and she worries that she might have a broken facial bone and she worries that she might have a concussion.   Eye Injury Associated symptoms include headaches. Pertinent negatives include no chest pain, no abdominal pain and no shortness of breath.    Past Medical History:  Diagnosis Date   Allergic reaction to bee sting 09/09/2018   Anxiety    Asthma    Bacterial vaginosis 07/07/2019   Bulging lumbar disc 07/22/2015   Cardiomyopathy (HCC) 07/18/2015   Overview:  Overview:  [place] 2009, normal coronaries  Last Assessment & Plan:  Relevant Hx: Course: Daily Update: Today's Plan: she has recently been seen by cards and is planned to have echo this Friday  Electronically signed by: Carrol Clam, FNP 07/24/15 1042   Carpal tunnel syndrome 07/22/2015   Cervical strain 01/11/2019   Chronic fatigue 07/22/2015   Chronic rhinitis 07/22/2015   Depression    Dyslipidemia 07/18/2015   Esophageal stricture 07/22/2015   Essential hypertension    Eustachian tube dysfunction 07/22/2015   Excessive daytime  sleepiness 05/16/2019   Gastro-esophageal reflux disease without esophagitis    Gastroesophageal reflux disease without esophagitis 07/18/2015   Last Assessment & Plan:  Relevant Hx: Course: Daily Update: Today's Plan:appears stable with ppi  Electronically signed by: Carrol Clam, FNP 07/24/15 1043   High risk medication use 07/22/2015   History of blood clots    Hot flash, menopausal 07/22/2015   Hyperlipidemia 07/22/2015   Last Assessment & Plan:  Relevant Hx: Course: Daily Update: Today's Plan:stable with meds  Electronically signed by: Carrol Clam, FNP 07/24/15 1112   Hypertension    Hypertensive heart disease with heart failure (HCC) 04/20/2023   Localized swelling of both lower legs 07/22/2015   Localized swelling of both lower legs 07/22/2015   Low back strain, initial encounter 07/07/2019   Lung mass    Lung nodules    Mixed hyperlipidemia 07/22/2015   Last Assessment & Plan:  Formatting of this note might be different from the original. Relevant Hx: Course: Daily Update: Today's Plan:stable with meds  Electronically signed by: Carrol Clam, FNP 07/24/15 1112   Morbid obesity (HCC) 07/22/2015   Other insomnia 11/22/2018   Pneumonia due to COVID-19 virus 01/17/2020   Primary osteoarthritis involving multiple joints 05/15/2016   Prophylactic antibiotic 08/01/2023   Pulmonary embolus (HCC) 01/18/2020   Right hand pain 07/29/2022   Screening for lung cancer  08/01/2023   Screening for osteoporosis 02/27/2017   Formatting of this note might be different from the original. 2019: spine 3.1, L femur 1.3   Seasonal allergic rhinitis due to pollen 07/22/2015   Sensation of fullness in both ears 08/01/2023   Severe obesity with body mass index (BMI) of 35.0 to 35.9 and comorbidity (HCC) 07/22/2015   Snores 07/22/2015   Last Assessment & Plan:  Relevant Hx: Course: Daily Update: Today's Plan:she was to have psg, but has not had this yet. Discussed that if  this is present could be worse post-op with pain meds. None the less, she will have protective airway with/during surgery. She should still have psg study, but wants to wait until after knee repair  Electronically signed by: Carrol Clam, FNP 07/24/15   Suprapubic pain 08/26/2023   Urinary frequency 05/01/2023   Vertigo 11/25/2021    Patient Active Problem List   Diagnosis Date Noted   Acute swimmer's ear of left side 09/04/2023   Seasonal allergies 09/04/2023   Suprapubic pain 08/26/2023   Chest pain 08/26/2023   Immunization due 08/26/2023   General medical exam 08/19/2023   History of tobacco use 08/01/2023   Idiopathic chronic gout without tophus 08/01/2023   Prophylactic antibiotic 08/01/2023   Sensation of fullness in both ears 08/01/2023   Screening for lung cancer 08/01/2023   Urinary frequency 05/01/2023   Leg swelling 05/01/2023   Hypertensive heart disease with heart failure (HCC) 04/20/2023   Myalgia due to statin 12/21/2022   Right hand pain 07/29/2022   GAD (generalized anxiety disorder) 11/30/2021   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   Vertigo 11/25/2021   Depression    History of blood clots    Other insomnia 11/22/2018   Primary osteoarthritis involving multiple joints 05/15/2016   Asthma 07/22/2015   Bulging lumbar disc 07/22/2015   Carpal tunnel syndrome 07/22/2015   Chronic fatigue 07/22/2015   Esophageal stricture 07/22/2015   Eustachian tube dysfunction 07/22/2015   Hot flash, menopausal 07/22/2015   Severe obesity with body mass index (BMI) of 35.0 to 35.9 and comorbidity (HCC) 07/22/2015   Seasonal allergic rhinitis due to pollen 07/22/2015   Mixed hyperlipidemia 07/22/2015   Obesity (BMI 30-39.9) 07/22/2015   Cardiomyopathy (HCC) 07/18/2015   Gastroesophageal reflux disease without esophagitis 07/18/2015   Lung nodules     Past Surgical History:  Procedure Laterality Date   ABDOMINAL HYSTERECTOMY     CARPAL TUNNEL  RELEASE Right    CHOLECYSTECTOMY     ECTOPIC PREGNANCY SURGERY     X 2   FOOT SURGERY     Knee replcement     POLYPECTOMY     TONSILECTOMY/ADENOIDECTOMY WITH MYRINGOTOMY  1972   TOTAL HIP ARTHROPLASTY Right    WISDOM TOOTH EXTRACTION      OB History   No obstetric history on file.      Home Medications    Prior to Admission medications   Medication Sig Start Date End Date Taking? Authorizing Provider  erythromycin ophthalmic ointment Place a 1/4 inch ribbon of ointment into the lower eyelid twice daily x 5-7 days 11/13/23  Yes Guss Legacy, FNP  mupirocin  ointment (BACTROBAN ) 2 % Apply 1 Application topically 2 (two) times daily. 11/13/23  Yes Guss Legacy, FNP  albuterol  (VENTOLIN  HFA) 108 (90 Base) MCG/ACT inhaler Inhale 1-2 puffs into the lungs every 6 (six) hours as needed for wheezing or shortness of breath.    [provider]  Albuterol -Budesonide (AIRSUPRA ) 90-80 MCG/ACT  AERO Inhale 1-2 puffs into the lungs every 6 (six) hours as needed. 07/28/23   CoxBurleigh Carp, MD  amLODipine  (NORVASC ) 10 MG tablet Take 1 tablet (10 mg total) by mouth as needed (for high blood pressure). 09/08/23   Mercy Stall, MD  aspirin EC 81 MG tablet Take 81 mg by mouth daily. Swallow whole.    [provider]  Bempedoic Acid-Ezetimibe  (NEXLIZET ) 180-10 MG TABS Take 1 tablet by mouth daily at 6 (six) AM. 11/04/23   Cox, Kirsten, MD  EPINEPHrine  0.3 mg/0.3 mL IJ SOAJ injection Inject 0.3 mg into the muscle as needed for anaphylaxis. 04/20/23   CoxBurleigh Carp, MD  ezetimibe  (ZETIA ) 10 MG tablet Take 1 tablet (10 mg total) by mouth daily. 08/31/23   Cox, Burleigh Carp, MD  fluticasone (FLONASE) 50 MCG/ACT nasal spray Place 1 spray into both nostrils daily. 03/13/16   [provider]  Fluticasone-Umeclidin-Vilant (TRELEGY ELLIPTA ) 200-62.5-25 MCG/ACT AEPB Inhale 1 Inhalation into the lungs daily. 04/17/22   Allegra Arch, NP  furosemide  (LASIX ) 20 MG tablet Take 1 tablet (20 mg total) by  mouth as needed for fluid or edema. 11/25/21   Cox, Burleigh Carp, MD  ibuprofen  (ADVIL ) 600 MG tablet Take 1 tablet (600 mg total) by mouth every 6 (six) hours as needed. 12/15/22   Spence Dux, PA-C  Loratadine  10 MG CAPS Take 1 capsule (10 mg total) by mouth daily. 09/04/23   Odilia Bennett, PA  LORazepam  (ATIVAN ) 0.5 MG tablet Take 1 tablet (0.5 mg total) by mouth 2 (two) times daily as needed for anxiety. 10/28/23   CoxBurleigh Carp, MD  Multiple Vitamin (MULTIVITAMIN) tablet Take 1 tablet by mouth daily.    [provider]  nitroGLYCERIN  (NITROSTAT ) 0.4 MG SL tablet Place 1 tablet (0.4 mg total) under the tongue every 5 (five) minutes as needed for chest pain (x3 total. call ems if chest pain does not resolve after one, call ems.). 09/08/23   Cox, Burleigh Carp, MD  sertraline  (ZOLOFT ) 100 MG tablet TAKE 1 & 1/2 (ONE & ONE-HALF) TABLETS BY MOUTH ONCE DAILY Patient taking differently: Take 100 mg by mouth daily. 06/21/23   CoxBurleigh Carp, MD    Family History Family History  Problem Relation Age of Onset   Dementia Mother    Heart attack Father    Heart disease Father    Leukemia Half-Brother    Multiple myeloma Half-Brother    Diabetes Half-Sister    Breast cancer Neg Hx     Social History Social History   Tobacco Use   Smoking status: Former    Current packs/day: 0.00    Types: Cigarettes    Quit date: 06/17/2015    Years since quitting: 8.4   Smokeless tobacco: Never  Vaping Use   Vaping status: Never Used  Substance Use Topics   Alcohol use: Yes    Alcohol/week: 3.0 - 4.0 standard drinks of alcohol    Types: 3 - 4 Cans of beer per week    Comment: every other day   Drug use: No     Allergies   Demerol, Meperidine, Sulfamethoxazole, Citalopram hydrobromide, Hydrocodone, Hydrocodone-acetaminophen, Metoprolol tartrate, Oxycodone-acetaminophen, Rosuvastatin , Statins, Diltiazem, and Metoprolol tartrate   Review of Systems Review of Systems  Constitutional:  Negative for chills and  fever.  HENT:  Negative for ear pain and sore throat.   Eyes:  Positive for pain, redness and visual disturbance (blurred).  Respiratory:  Negative for cough and shortness of breath.   Cardiovascular:  Negative for chest  pain and palpitations.  Gastrointestinal:  Negative for abdominal pain, constipation, diarrhea, nausea and vomiting.  Genitourinary:  Negative for dysuria and hematuria.  Musculoskeletal:  Negative for arthralgias and back pain.  Skin:  Negative for color change and rash.  Neurological:  Positive for dizziness and headaches. Negative for seizures and syncope.  All other systems reviewed and are negative.    Physical Exam Triage Vital Signs ED Triage Vitals  Encounter Vitals Group     BP 11/13/23 1130 (!) 149/93     Systolic BP Percentile --      Diastolic BP Percentile --      Pulse Rate 11/13/23 1130 70     Resp 11/13/23 1130 18     Temp 11/13/23 1130 98.6 F (37 C)     Temp Source 11/13/23 1130 Oral     SpO2 11/13/23 1130 98 %     Weight --      Height --      Head Circumference --      Peak Flow --      Pain Score 11/13/23 1132 5     Pain Loc --      Pain Education --      Exclude from Growth Chart --    No data found.  Updated Vital Signs BP (!) 149/93 (BP Location: Right Arm)   Pulse 70   Temp 98.6 F (37 C) (Oral)   Resp 18   LMP  (LMP Unknown)   SpO2 98%   Visual Acuity Right Eye Distance: 20/20 (with glasses) Left Eye Distance: 20/70 (with glasses) Bilateral Distance: 20/20 (with glasses)  Right Eye Near:   Left Eye Near:    Bilateral Near:     Physical Exam Vitals and nursing note reviewed.  Constitutional:      General: She is not in acute distress.    Appearance: She is well-developed. She is not ill-appearing or toxic-appearing.  HENT:     Head: Normocephalic and atraumatic.     Right Ear: Hearing, tympanic membrane, ear canal and external ear normal.     Left Ear: Hearing, tympanic membrane, ear canal and external ear  normal.     Nose: No congestion or rhinorrhea.     Right Sinus: No maxillary sinus tenderness or frontal sinus tenderness.     Left Sinus: No maxillary sinus tenderness or frontal sinus tenderness.     Mouth/Throat:     Lips: Pink.     Mouth: Mucous membranes are moist.     Pharynx: Uvula midline. No oropharyngeal exudate or posterior oropharyngeal erythema.     Tonsils: No tonsillar exudate.  Eyes:     General:        Right eye: No foreign body, discharge or hordeolum.        Left eye: No foreign body, discharge or hordeolum.     Conjunctiva/sclera: Conjunctivae normal.     Right eye: Right conjunctiva is not injected. No chemosis, exudate or hemorrhage.    Left eye: Left conjunctiva is not injected. Chemosis present. No exudate or hemorrhage.    Pupils: Pupils are equal, round, and reactive to light.     Left eye: Corneal abrasion and fluorescein uptake present.     Comments: Left upper and lower eyelids with ecchymosis and swollen nearly shut.  Left upper eyelid has a 2 to 3 mm superficial abrasion that has already crusted closed.  No erythema, edema nor exudate of either eyelid nor the abrasion.  Cardiovascular:  Rate and Rhythm: Normal rate and regular rhythm.     Heart sounds: S1 normal and S2 normal. No murmur heard. Pulmonary:     Effort: Pulmonary effort is normal. No respiratory distress.     Breath sounds: Normal breath sounds. No decreased breath sounds, wheezing, rhonchi or rales.  Abdominal:     General: Bowel sounds are normal.     Palpations: Abdomen is soft.     Tenderness: There is no abdominal tenderness.  Musculoskeletal:        General: No swelling.     Cervical back: Neck supple.  Lymphadenopathy:     Head:     Right side of head: No submental, submandibular, tonsillar, preauricular or posterior auricular adenopathy.     Left side of head: No submental, submandibular, tonsillar, preauricular or posterior auricular adenopathy.     Cervical: No cervical  adenopathy.     Right cervical: No superficial cervical adenopathy.    Left cervical: No superficial cervical adenopathy.  Skin:    General: Skin is warm and dry.     Capillary Refill: Capillary refill takes less than 2 seconds.     Findings: No rash.  Neurological:     Mental Status: She is alert and oriented to person, place, and time.     Cranial Nerves: Cranial nerves 2-12 are intact.     Sensory: Sensation is intact.     Motor: Motor function is intact.     Coordination: Romberg sign positive (Slightly unsteady on her feet.  Her left eye vision is a little blurred and I do not think she has good depth perception right now.).     Gait: Gait is intact.  Psychiatric:        Mood and Affect: Mood normal.      UC Treatments / Results  Labs (all labs ordered are listed, but only abnormal results are displayed) Labs Reviewed - No data to display  EKG   Radiology No results found.  Procedures Procedures (including critical care time)  Medications Ordered in UC Medications - No data to display  Initial Impression / Assessment and Plan / UC Course  I have reviewed the triage vital signs and the nursing notes.  Pertinent labs & imaging results that were available during my care of the patient were reviewed by me and considered in my medical decision making (see chart for details).  Plan of Care: Corneal abrasion: Erythromycin ointment to left eye twice daily.  Follow-up with if vision does not clear and improve or may need to see ophthalmology.  May use mupirocin  ointment to the abrasion on her left upper eyelid, once daily as needed.  Headache and possible concussion: Reviewed signs and symptoms of concussion.  May need further workup.  Advise rest for now.  She is certainly having headaches but I am not sure if she has a concussion or not.  She may need a CT scan for further workup and discussed signs and symptoms that indicate an acute need for a CT scan.  Left facial  pain: X-rays appear negative.  Follow-up as needed.  I reviewed the plan of care with the patient and/or the patient's guardian.  The patient and/or guardian had time to ask questions and acknowledged that the questions were answered.  I provided instruction on symptoms or reasons to return here or to go to an ER, if symptoms/condition did not improve, worsened or if new symptoms occurred.  Final Clinical Impressions(s) / UC Diagnoses   Final  diagnoses:  Nonintractable headache, unspecified chronicity pattern, unspecified headache type  Traumatic ecchymosis of left eyelid, initial encounter  Abrasion of left cornea, initial encounter  Pain of cheek  Pain of left eye     Discharge Instructions      Corneal abrasion: Use erythromycin ointment to the left eye twice daily for 5 to 7 days.  Follow-up if symptoms do not improve  Headache and concern for concussion: Patient has a good neuro-exam but she feels intermittently dizzy and worried she has a concussion.  I explained that concussion is possible even though she has a good neuroexam.  If her headaches persist or she feels like she has a concussion she can return here or go to an emergency room.  She may need a head CT scan which would be able to be done at an emergency room.  Follow-up with family doctor as needed.  Facial pain: X-rays appear negative for facial bone fracture.  Follow-up as needed.  Return here or see primary care if symptoms do not improve, worsen or new symptoms occur.   ED Prescriptions     Medication Sig Dispense Auth. Provider   erythromycin ophthalmic ointment Place a 1/4 inch ribbon of ointment into the lower eyelid twice daily x 5-7 days 3.5 g Guss Legacy, FNP   mupirocin  ointment (BACTROBAN ) 2 % Apply 1 Application topically 2 (two) times daily. 22 g Guss Legacy, FNP      PDMP not reviewed this encounter.   Guss Legacy, FNP 11/13/23 1329

## 2023-11-14 ENCOUNTER — Other Ambulatory Visit: Payer: Self-pay | Admitting: Family Medicine

## 2023-11-14 DIAGNOSIS — F331 Major depressive disorder, recurrent, moderate: Secondary | ICD-10-CM

## 2023-11-14 DIAGNOSIS — F411 Generalized anxiety disorder: Secondary | ICD-10-CM

## 2023-11-16 ENCOUNTER — Ambulatory Visit (HOSPITAL_BASED_OUTPATIENT_CLINIC_OR_DEPARTMENT_OTHER)
Admission: RE | Admit: 2023-11-16 | Discharge: 2023-11-16 | Disposition: A | Discharge status: Home / Self Care | Source: Ambulatory Visit | Attending: Physician Assistant | Admitting: Physician Assistant

## 2023-11-16 ENCOUNTER — Ambulatory Visit: Payer: Self-pay | Admitting: Physician Assistant

## 2023-11-16 DIAGNOSIS — S0990XA Unspecified injury of head, initial encounter: Secondary | ICD-10-CM | POA: Diagnosis not present

## 2023-11-16 DIAGNOSIS — W1830XA Fall on same level, unspecified, initial encounter: Secondary | ICD-10-CM

## 2023-11-16 DIAGNOSIS — S0012XA Contusion of left eyelid and periocular area, initial encounter: Secondary | ICD-10-CM

## 2023-11-16 DIAGNOSIS — H5712 Ocular pain, left eye: Secondary | ICD-10-CM

## 2023-11-16 DIAGNOSIS — R220 Localized swelling, mass and lump, head: Secondary | ICD-10-CM | POA: Diagnosis not present

## 2023-11-17 ENCOUNTER — Telehealth: Payer: Self-pay

## 2023-11-17 NOTE — Telephone Encounter (Signed)
 A referral order is needed.

## 2023-11-17 NOTE — Telephone Encounter (Signed)
 Copied from CRM [id number]. Topic: Referral - Request for Referral >> Nov 17, 2023  9:49 AM Eleanore Grey wrote: Did the patient discuss referral with their provider in the last year? Yes (If No - schedule appointment) (If Yes - send message)  Appointment offered? No  Type of order/referral and detailed reason for visit: Ear, Nose, and Throat. Been having issues with left ear popping and really bad sinuses   Preference of office, provider, location: [place]   If referral order, have you been seen by this specialty before? No (If Yes, this issue or another issue? When? Where?  Can we respond through MyChart? Yes

## 2023-11-19 ENCOUNTER — Other Ambulatory Visit: Payer: Self-pay | Admitting: Physician Assistant

## 2023-11-19 DIAGNOSIS — Z9181 History of falling: Secondary | ICD-10-CM

## 2023-11-19 NOTE — Telephone Encounter (Signed)
 Spoke with patient, she was not too happy when I asked why she needed the referral she stated she should look at the notes and see where I spoke with [facility] Friday.   Last Thursday evening she has a fall outside in the yard and hit her face/head on a chain link fence and now has a big goose egg over her eye, a black eye, and both ears are pooping left worse then the right, thinks something is wrong with her allergies, she states she spoke with Mirian Ames about it on Friday afternoon.   Spoke with Mirian Ames and I am forwarding him this message as well.

## 2023-12-04 DIAGNOSIS — H5213 Myopia, bilateral: Secondary | ICD-10-CM | POA: Diagnosis not present

## 2023-12-05 ENCOUNTER — Other Ambulatory Visit: Payer: Self-pay | Admitting: Family Medicine

## 2023-12-21 ENCOUNTER — Encounter (INDEPENDENT_AMBULATORY_CARE_PROVIDER_SITE_OTHER): Payer: Self-pay

## 2023-12-24 DIAGNOSIS — H9191 Unspecified hearing loss, right ear: Secondary | ICD-10-CM | POA: Diagnosis not present

## 2024-01-11 ENCOUNTER — Ambulatory Visit (INDEPENDENT_AMBULATORY_CARE_PROVIDER_SITE_OTHER)

## 2024-01-11 VITALS — BP 148/82 | HR 94 | Temp 97.3°F | Ht 63.0 in | Wt 206.0 lb

## 2024-01-11 DIAGNOSIS — L237 Allergic contact dermatitis due to plants, except food: Secondary | ICD-10-CM

## 2024-01-11 DIAGNOSIS — L2989 Other pruritus: Secondary | ICD-10-CM | POA: Diagnosis not present

## 2024-01-11 MED ORDER — TRIAMCINOLONE ACETONIDE 40 MG/ML IJ SUSP
40.0000 mg | Freq: Once | INTRAMUSCULAR | Status: AC
  Administered 2024-01-11: 40 mg via INTRAMUSCULAR

## 2024-01-11 MED ORDER — PREDNISONE 20 MG PO TABS: 40.0000 mg | ORAL_TABLET | Freq: Every day | ORAL | 0 refills | Status: AC

## 2024-01-11 NOTE — Patient Instructions (Signed)
  VISIT SUMMARY: You visited us  today due to an itchy rash suspected to be from poison sumac exposure. The rash has been spreading and causing significant discomfort.  YOUR PLAN: CONTACT DERMATITIS DUE TO POISON SUMAC: You have an itchy rash on the backs of your legs, spreading to your mouth, likely from poison sumac exposure during recent outdoor activities. -You received a Kenalog  40 mg IM injection for rapid relief. -You are prescribed a 3-day course of prednisone , 6 tablets total. Take 2 tablets at a time. -Take loratadine  or cetirizine and famotidine for allergic rash relief. -Use diphenhydramine as needed for severe itching, but be aware it may cause drowsiness. -Apply calamine lotion or diphenhydramine cream for topical relief. -Return for evaluation if your symptoms worsen or do not improve.                      Contains text generated by [employer]bridge.                                 Contains text generated by [employer]bridge.

## 2024-01-11 NOTE — Progress Notes (Signed)
 Acute Office Visit  Subjective:    Patient ID: [name], female    DOB: [birth year], 58 y.o.   MRN: [id number]  Chief Complaint  Patient presents with   Rash    HPI: Patient is in today for a problem visit.  Discussed the use of AI scribe software for clinical note transcription with the patient, who gave verbal consent to proceed.  History of Present Illness   [name] is a 58 year old female who presents with a rash suspected to be from poison sumac exposure.  Pruritic rash - Onset last Saturday after exposure to poison sumac - Located on the backs of the legs, with new spots appearing including one on the mouth - Intensely pruritic - Rash has been spreading - History of similar rash in childhood that spread after entering bloodstream  Symptom management - Uses Benadryl and applies 91% alcohol to affected areas, which provides some relief but causes burning sensation - Applied prescription antibiotic cream without significant improvement  Associated symptoms - No chest pain - No dyspnea - No throat closing sensations  Environmental exposure - Recent outdoor activities include cutting down small trees and working in flower beds while wearing shorts - Believes these activities contributed to poison sumac exposure        Past Medical History:  Diagnosis Date   Allergic reaction to bee sting 09/09/2018   Anxiety    Asthma    Bacterial vaginosis 07/07/2019   Bulging lumbar disc 07/22/2015   Cardiomyopathy (HCC) 07/18/2015   Overview:  Overview:  [place] 2009, normal coronaries  Last Assessment & Plan:  Relevant Hx: Course: Daily Update: Today's Plan: she has recently been seen by cards and is planned to have echo this Friday  Electronically signed by: [place] Dariel Sayres, FNP 07/24/15 1042   Carpal tunnel syndrome 07/22/2015   Cervical strain 01/11/2019   Chronic fatigue 07/22/2015   Chronic rhinitis 07/22/2015   Depression    Dyslipidemia  07/18/2015   Esophageal stricture 07/22/2015   Essential hypertension    Eustachian tube dysfunction 07/22/2015   Excessive daytime sleepiness 05/16/2019   Gastro-esophageal reflux disease without esophagitis    Gastroesophageal reflux disease without esophagitis 07/18/2015   Last Assessment & Plan:  Relevant Hx: Course: Daily Update: Today's Plan:appears stable with ppi  Electronically signed by: Gl[place] Dariel Sayres, FNP 07/24/15 1043   High risk medication use 07/22/2015   History of blood clots    Hot flash, menopausal 07/22/2015   Hyperlipidemia 07/22/2015   Last Assessment & Plan:  Relevant Hx: Course: Daily Update: Today's Plan:stable with meds  Electronically signed by: Glend[place] Dariel Sayres, FNP 07/24/15 1112   Hypertension    Hypertensive heart disease with heart failure (HCC) 04/20/2023   Localized swelling of both lower legs 07/22/2015   Localized swelling of both lower legs 07/22/2015   Low back strain, initial encounter 07/07/2019   Lung mass    Lung nodules    Mixed hyperlipidemia 07/22/2015   Last Assessment & Plan:  Formatting of this note might be different from the original. Relevant Hx: Course: Daily Update: Today's Plan:stable with meds  Electronically signed by: Gl[place] Dariel Sayres, FNP 07/24/15 1112   Morbid obesity (HCC) 07/22/2015   Other insomnia 11/22/2018   Pneumonia due to COVID-19 virus 01/17/2020   Primary osteoarthritis involving multiple joints 05/15/2016   Prophylactic antibiotic 08/01/2023   Pulmonary embolus (HCC) 01/18/2020   Right hand pain 07/29/2022   Screening for lung cancer 08/01/2023  Screening for osteoporosis 02/27/2017   Formatting of this note might be different from the original. 2019: spine 3.1, L femur 1.3   Seasonal allergic rhinitis due to pollen 07/22/2015   Sensation of fullness in both ears 08/01/2023   Severe obesity with body mass index (BMI) of 35.0 to 35.9 and comorbidity (HCC) 07/22/2015   Snores 07/22/2015    Last Assessment & Plan:  Relevant Hx: Course: Daily Update: Today's Plan:she was to have psg, but has not had this yet. Discussed that if this is present could be worse post-op with pain meds. None the less, she will have protective airway with/during surgery. She should still have psg study, but wants to wait until after knee repair  Electronically signed by: [place] Dariel Sayres, FNP 07/24/15   Suprapubic pain 08/26/2023   Urinary frequency 05/01/2023   Vertigo 11/25/2021    Past Surgical History:  Procedure Laterality Date   ABDOMINAL HYSTERECTOMY     CARPAL TUNNEL RELEASE Right    CHOLECYSTECTOMY     ECTOPIC PREGNANCY SURGERY     X 2   FOOT SURGERY     Knee replcement     POLYPECTOMY     TONSILECTOMY/ADENOIDECTOMY WITH MYRINGOTOMY  1972   TOTAL HIP ARTHROPLASTY Right    WISDOM TOOTH EXTRACTION      Family History  Problem Relation Age of Onset   Dementia Mother    Heart attack Father    Heart disease Father    Leukemia Half-Brother    Multiple myeloma Half-Brother    Diabetes Half-Sister    Breast cancer Neg Hx     Social History   Socioeconomic History   Marital status: Divorced    Spouse name: NONE   Number of children: 0   Years of education: 12 +  SOME [employer]   Highest education level: Some college, no degree  Occupational History   Occupation: DIABLED  Tobacco Use   Smoking status: Former    Current packs/day: 0.00    Types: Cigarettes    Quit date: 06/17/2015    Years since quitting: 8.5   Smokeless tobacco: Never  Vaping Use   Vaping status: Never Used  Substance and Sexual Activity   Alcohol use: Yes    Alcohol/week: 3.0 - 4.0 standard drinks of alcohol    Types: 3 - 4 Cans of beer per week    Comment: every other day   Drug use: No   Sexual activity: Yes    Partners: Male  Other Topics Concern   Not on file  Social History Narrative   Not on file   Social Drivers of Health   Financial Resource Strain: Low Risk  (08/13/2023)   Overall  Financial Resource Strain (CARDIA)    Difficulty of Paying Living Expenses: Not hard at all  Recent Concern: Financial Resource Strain - High Risk (07/27/2023)   Overall Financial Resource Strain (CARDIA)    Difficulty of Paying Living Expenses: Hard  Food Insecurity: No Food Insecurity (08/13/2023)   Hunger Vital Sign    Worried About Running Out of Food in the Last Year: Never true    Ran Out of Food in the Last Year: Never true  Transportation Needs: No Transportation Needs (08/13/2023)   PRAPARE - Administrator, Civil Service (Medical): No    Lack of Transportation (Non-Medical): No  Physical Activity: Sufficiently Active (08/13/2023)   Exercise Vital Sign    Days of Exercise per Week: 3 days    Minutes of  Exercise per Session: 60 min  Stress: Stress Concern Present (08/13/2023)   [employer] of Occupational Health - Occupational Stress Questionnaire    Feeling of Stress : To some extent  Social Connections: Moderately Isolated (08/13/2023)   Social Connection and Isolation Panel    Frequency of Communication with Friends and Family: More than three times a week    Frequency of Social Gatherings with Friends and Family: Once a week    Attends Religious Services: 1 to 4 times per year    Active Member of [employer] or Organizations: No    Attends Banker Meetings: Never    Marital Status: Divorced  Catering manager Violence: Not At Risk (08/13/2023)   Humiliation, Afraid, Rape, and Kick questionnaire    Fear of Current or Ex-Partner: No    Emotionally Abused: No    Physically Abused: No    Sexually Abused: No    Outpatient Medications Prior to Visit  Medication Sig Dispense Refill   albuterol  (VENTOLIN  HFA) 108 (90 Base) MCG/ACT inhaler Inhale 1-2 puffs into the lungs every 6 (six) hours as needed for wheezing or shortness of breath.     Albuterol -Budesonide (AIRSUPRA ) 90-80 MCG/ACT AERO Inhale 1-2 puffs into the lungs every 6 (six) hours as needed. 10.7 g  3   amLODipine  (NORVASC ) 10 MG tablet TAKE 1 TABLET BY MOUTH AS NEEDED FOR HIGH BLOOD PRESSURE 30 tablet 0   aspirin EC 81 MG tablet Take 81 mg by mouth daily. Swallow whole.     Bempedoic Acid-Ezetimibe  (NEXLIZET ) 180-10 MG TABS Take 1 tablet by mouth daily at 6 (six) AM. 90 tablet 1   EPINEPHrine  0.3 mg/0.3 mL IJ SOAJ injection Inject 0.3 mg into the muscle as needed for anaphylaxis. 2 each 1   erythromycin  ophthalmic ointment Place a 1/4 inch ribbon of ointment into the lower eyelid twice daily x 5-7 days 3.5 g 0   ezetimibe  (ZETIA ) 10 MG tablet Take 1 tablet (10 mg total) by mouth daily. 90 tablet 1   fluticasone (FLONASE) 50 MCG/ACT nasal spray Place 1 spray into both nostrils daily.     Fluticasone-Umeclidin-Vilant (TRELEGY ELLIPTA ) 200-62.5-25 MCG/ACT AEPB Inhale 1 Inhalation into the lungs daily. 1 each 11   furosemide  (LASIX ) 20 MG tablet Take 1 tablet (20 mg total) by mouth as needed for fluid or edema. 90 tablet 0   ibuprofen  (ADVIL ) 600 MG tablet Take 1 tablet (600 mg total) by mouth every 6 (six) hours as needed. 30 tablet 0   Loratadine  10 MG CAPS Take 1 capsule (10 mg total) by mouth daily. 30 capsule 3   LORazepam  (ATIVAN ) 0.5 MG tablet Take 1 tablet (0.5 mg total) by mouth 2 (two) times daily as needed for anxiety. 30 tablet 1   Multiple Vitamin (MULTIVITAMIN) tablet Take 1 tablet by mouth daily.     mupirocin  ointment (BACTROBAN ) 2 % Apply 1 Application topically 2 (two) times daily. 22 g 0   nitroGLYCERIN  (NITROSTAT ) 0.4 MG SL tablet Place 1 tablet (0.4 mg total) under the tongue every 5 (five) minutes as needed for chest pain (x3 total. call ems if chest pain does not resolve after one, call ems.). 25 tablet 0   sertraline  (ZOLOFT ) 100 MG tablet TAKE 1 & 1/2 (ONE & ONE-HALF) TABLETS BY MOUTH ONCE DAILY 135 tablet 1   No facility-administered medications prior to visit.    Allergies  Allergen Reactions   Demerol Anaphylaxis   Meperidine Anaphylaxis   Sulfamethoxazole  Itching  With redness to skin    Citalopram Hydrobromide Other (See Comments)    Unknown   Hydrocodone    Hydrocodone-Acetaminophen Other (See Comments)    Unknown   Metoprolol Tartrate Other (See Comments)    Chest discomfort, palpations   Oxycodone-Acetaminophen Other (See Comments)    Unknown   Rosuvastatin  Other (See Comments)    myalgias   Statins     myalgias   Diltiazem Rash   Metoprolol Tartrate Palpitations    Review of Systems  Constitutional:  Negative for appetite change, fatigue and fever.  HENT:  Negative for congestion, ear pain, sinus pressure and sore throat.   Respiratory:  Negative for cough, chest tightness, shortness of breath and wheezing.   Cardiovascular:  Negative for chest pain and palpitations.  Gastrointestinal:  Negative for abdominal pain, constipation, diarrhea, nausea and vomiting.  Genitourinary:  Negative for dysuria and hematuria.  Musculoskeletal:  Negative for arthralgias, back pain, joint swelling and myalgias.  Skin:  Positive for rash (Rash around ankles and lower legs).  Neurological:  Negative for dizziness, weakness and headaches.  Psychiatric/Behavioral:  Negative for dysphoric mood. The patient is not nervous/anxious.        Objective:        01/11/2024    3:51 PM 11/13/2023   11:30 AM 10/28/2023    8:06 AM  Vitals with BMI  Height 5' 3  5' 3  Weight 206 lbs  203 lbs  BMI 36.5  35.97  Systolic 148 149 863  Diastolic 82 93 80  Pulse 94 70 77    Orthostatic VS for the past 72 hrs (Last 3 readings):  Patient Position BP Location  01/11/24 1551 Sitting Left Arm     Physical Exam Vitals and nursing note reviewed.  Constitutional:      Appearance: She is obese.  HENT:     Head: Normocephalic and atraumatic.  Cardiovascular:     Rate and Rhythm: Normal rate and regular rhythm.  Skin:    Findings: Rash (erythematous rash noted on the lower extremities) present.  Neurological:     Mental Status: She is alert.         Health Maintenance Due  Topic Date Due   Hepatitis B Vaccines (1 of 3 - 19+ 3-dose series) Never done   Zoster Vaccines- Shingrix (1 of 2) Never done   Cervical Cancer Screening (HPV/Pap Cotest)  12/14/2022   Colonoscopy  12/21/2023       Topic Date Due   Hepatitis B Vaccines (1 of 3 - 19+ 3-dose series) Never done     Lab Results  Component Value Date   TSH 2.230 07/28/2023   Lab Results  Component Value Date   WBC 5.8 10/28/2023   HGB 14.1 10/28/2023   HCT 42.1 10/28/2023   MCV 100 (H) 10/28/2023   PLT 329 10/28/2023   Lab Results  Component Value Date   NA 143 10/28/2023   K 4.7 10/28/2023   CO2 25 10/28/2023   GLUCOSE 91 10/28/2023   BUN 8 10/28/2023   CREATININE 0.62 10/28/2023   BILITOT 0.5 10/28/2023   ALKPHOS 148 (H) 10/28/2023   AST 23 10/28/2023   ALT 15 10/28/2023   PROT 6.8 10/28/2023   ALBUMIN 4.5 10/28/2023   CALCIUM  9.4 10/28/2023   ANIONGAP 10 12/15/2022   EGFR 104 10/28/2023   Lab Results  Component Value Date   CHOL 269 (H) 10/28/2023   Lab Results  Component Value Date   HDL 108 10/28/2023  Lab Results  Component Value Date   LDLCALC 147 (H) 10/28/2023   Lab Results  Component Value Date   TRIG 86 10/28/2023   Lab Results  Component Value Date   CHOLHDL 2.5 10/28/2023   Lab Results  Component Value Date   HGBA1C 5.0 11/23/2020       Assessment & Plan:  Allergic contact dermatitis due to plants, except food  Pruritic erythematous rash Assessment & Plan: Contact Dermatitis due to Poison Sumac Presents with an itchy rash on the backs of her legs, spreading to her mouth, consistent with contact dermatitis from poison sumac exposure. Exposure occurred while cutting down small trees and weed cutting without protective clothing. Previous treatments with diphenhydramine, alcohol, and antibiotic cream were ineffective. The rash is primarily pruritic rather than painful, indicating an allergic reaction rather than an  infection. - Administer Kenalog  40 mg IM injection for rapid relief. - Prescribe a 3-day course of prednisone , 6 tablets total, to be taken as 2 tablets at a time for continued anti-inflammatory effect. - Advise taking loratadine  or cetirizine and famotidine for allergic rash relief. - Recommend diphenhydramine as needed for severe pruritus, noting it may cause sedation. - Suggest using calamine lotion or diphenhydramine cream for topical relief. - Instruct to return for evaluation if symptoms worsen or do not improve.    Other orders -     predniSONE ; Take 2 tablets (40 mg total) by mouth daily with breakfast for 3 days.  Dispense: 6 tablet; Refill: 0    Assessment and Plan           Meds ordered this encounter  Medications   predniSONE  (DELTASONE ) 20 MG tablet    Sig: Take 2 tablets (40 mg total) by mouth daily with breakfast for 3 days.    Dispense:  6 tablet    Refill:  0    No orders of the defined types were placed in this encounter.    Follow-up: Return if symptoms worsen or fail to improve.  An After Visit Summary was printed and given to the patient.    Jilliane Kazanjian, MD [facility] [phone]

## 2024-01-11 NOTE — Assessment & Plan Note (Signed)
 Contact Dermatitis due to Poison Sumac Presents with an itchy rash on the backs of her legs, spreading to her mouth, consistent with contact dermatitis from poison sumac exposure. Exposure occurred while cutting down small trees and weed cutting without protective clothing. Previous treatments with diphenhydramine, alcohol, and antibiotic cream were ineffective. The rash is primarily pruritic rather than painful, indicating an allergic reaction rather than an infection. - Administer Kenalog  40 mg IM injection for rapid relief. - Prescribe a 3-day course of prednisone , 6 tablets total, to be taken as 2 tablets at a time for continued anti-inflammatory effect. - Advise taking loratadine  or cetirizine and famotidine for allergic rash relief. - Recommend diphenhydramine as needed for severe pruritus, noting it may cause sedation. - Suggest using calamine lotion or diphenhydramine cream for topical relief. - Instruct to return for evaluation if symptoms worsen or do not improve.

## 2024-01-11 NOTE — Addendum Note (Signed)
 Addended by: Shatoria Stooksbury K on: 01/11/2024 04:13 PM   Modules accepted: Orders

## 2024-02-03 ENCOUNTER — Ambulatory Visit: Admitting: Family Medicine

## 2024-02-21 ENCOUNTER — Other Ambulatory Visit: Payer: Self-pay | Admitting: Family Medicine

## 2024-02-29 ENCOUNTER — Other Ambulatory Visit: Payer: Self-pay | Admitting: Acute Care

## 2024-02-29 DIAGNOSIS — Z1231 Encounter for screening mammogram for malignant neoplasm of breast: Secondary | ICD-10-CM

## 2024-03-22 ENCOUNTER — Encounter: Payer: Self-pay | Admitting: Family Medicine

## 2024-03-22 ENCOUNTER — Ambulatory Visit: Payer: Self-pay

## 2024-03-22 ENCOUNTER — Ambulatory Visit (INDEPENDENT_AMBULATORY_CARE_PROVIDER_SITE_OTHER): Admitting: Family Medicine

## 2024-03-22 VITALS — BP 128/70 | HR 68 | Temp 97.8°F | Resp 16 | Ht 63.0 in | Wt 208.2 lb

## 2024-03-22 DIAGNOSIS — R300 Dysuria: Secondary | ICD-10-CM | POA: Diagnosis not present

## 2024-03-22 DIAGNOSIS — I10 Essential (primary) hypertension: Secondary | ICD-10-CM | POA: Diagnosis not present

## 2024-03-22 DIAGNOSIS — N3000 Acute cystitis without hematuria: Secondary | ICD-10-CM | POA: Diagnosis not present

## 2024-03-22 LAB — POCT URINALYSIS DIP (CLINITEK)
Bilirubin, UA: NEGATIVE
Blood, UA: NEGATIVE
Glucose, UA: NEGATIVE mg/dL
Ketones, POC UA: NEGATIVE mg/dL
Leukocytes, UA: NEGATIVE
Nitrite, UA: NEGATIVE
POC PROTEIN,UA: NEGATIVE
Spec Grav, UA: <=1.005 — AB (ref 1.010–1.025)
Urobilinogen, UA: 0.2 U/dL
pH, UA: 7.5 (ref 5.0–8.0)

## 2024-03-22 MED ORDER — FLUCONAZOLE 150 MG PO TABS: 150.0000 mg | ORAL_TABLET | Freq: Every day | ORAL | 0 refills | Status: AC

## 2024-03-22 MED ORDER — NITROFURANTOIN MONOHYD MACRO 100 MG PO CAPS: 100.0000 mg | ORAL_CAPSULE | Freq: Two times a day (BID) | ORAL | 0 refills | Status: AC

## 2024-03-22 MED ORDER — FUROSEMIDE 20 MG PO TABS: 20.0000 mg | ORAL_TABLET | ORAL | 0 refills | Status: AC | PRN

## 2024-03-22 NOTE — Assessment & Plan Note (Addendum)
-   UA done in the office was negative,patient symptomatic with recent change in soap. - Order urine culture  Orders:   POCT URINALYSIS DIP (CLINITEK)   Urine Culture   nitrofurantoin, macrocrystal-monohydrate, (MACROBID) 100 MG capsule; Take 1 capsule (100 mg total) by mouth 2 (two) times daily for 5 days.   fluconazole  (DIFLUCAN ) 150 MG tablet; Take 1 tablet (150 mg total) by mouth daily for 1 dose.

## 2024-03-22 NOTE — Assessment & Plan Note (Addendum)
 UTI Patient here with symptoms of dysuria started yesterday. UA at home was positive. UA in the office showed Lab Results  Component Value Date   COLORU yellow 03/22/2024   CLARITYU clear 03/22/2024   GLUCOSEUR negative 03/22/2024   BILIRUBINUR negative 03/22/2024   SPECGRAV <=1.005 (A) 03/22/2024   RBCUR negative 03/22/2024   PHUR 7.5 03/22/2024   PROTEINUR NEGATIVE 12/15/2022   UROBILINOGEN 0.2 03/22/2024   LEUKOCYTESUR Negative 03/22/2024    Suprapubic tenderness noted on exam.  She does not appear septic, has bilateral flank tenderness.  Vitals reassuring.   Plan: Symptoms suggestive of urinary tract infection. I have prescribed Macrobid 100 mg by mouth TWICE A DAY for 5 days. Your symptoms should gradually improve. Call if the burning worsens, you develop a fever, back pain or pelvic pain or if your symptom do not resolve after completing the antibiotic. Drink plenty of fluids Complete the full course of antibiotics even if the symptoms resolve Remember to wipe from front to back and don't hold it in! If possible, empty your bladder every 4 hours. Advised to watch out for any fever/chills/back pain and to go to the emergency room if she does for evaluation for pyelonephritis and IV fluids and IV antibiotics.

## 2024-03-22 NOTE — Progress Notes (Deleted)
 Acute Office Visit  Subjective:    Patient ID: [name], female    DOB: 1965/12/30, 58 y.o.   MRN: [id number]  Chief Complaint  Patient presents with   Dysuria    HPI: Patient is in today for dysuria.  Discussed the use of AI scribe software for clinical note transcription with the patient, who gave verbal consent to proceed.      Past Medical History:  Diagnosis Date   Allergic reaction to bee sting 09/09/2018   Anxiety    Asthma    Bacterial vaginosis 07/07/2019   Bulging lumbar disc 07/22/2015   Cardiomyopathy (HCC) 07/18/2015   Overview:  Overview:  T[place] T[place] 2009, normal coronaries  Last Assessment & Plan:  Relevant Hx: Course: Daily Update: Today's Plan: she has recently been seen by cards and is planned to have echo this Friday  Electronically signed by: [place] Dariel Sayres, FNP 07/24/15 1042   Carpal tunnel syndrome 07/22/2015   Cervical strain 01/11/2019   Chronic fatigue 07/22/2015   Chronic rhinitis 07/22/2015   Depression    Dyslipidemia 07/18/2015   Esophageal stricture 07/22/2015   Essential hypertension    Eustachian tube dysfunction 07/22/2015   Excessive daytime sleepiness 05/16/2019   Gastro-esophageal reflux disease without esophagitis    Gastroesophageal reflux disease without esophagitis 07/18/2015   Last Assessment & Plan:  Relevant Hx: Course: Daily Update: Today's Plan:appears stable with ppi  Electronically signed by: Glend[place] Dariel Sayres, FNP 07/24/15 1043   High risk medication use 07/22/2015   History of blood clots    Hot flash, menopausal 07/22/2015   Hyperlipidemia 07/22/2015   Last Assessment & Plan:  Relevant Hx: Course: Daily Update: Today's Plan:stable with meds  Electronically signed by: Glendale Dariel Sayres, FNP 07/24/15 1112   Hypertension    Hypertensive heart disease with heart failure (HCC) 04/20/2023   Localized swelling of both lower legs 07/22/2015   Localized swelling of both lower legs 07/22/2015   Low  back strain, initial encounter 07/07/2019   Lung mass    Lung nodules    Mixed hyperlipidemia 07/22/2015   Last Assessment & Plan:  Formatting of this note might be different from the original. Relevant Hx: Course: Daily Update: Today's Plan:stable with meds  Electronically signed by: Glendale Dariel Sayres, FNP 07/24/15 1112   Morbid obesity (HCC) 07/22/2015   Other insomnia 11/22/2018   Pneumonia due to COVID-19 virus 01/17/2020   Primary osteoarthritis involving multiple joints 05/15/2016   Prophylactic antibiotic 08/01/2023   Pulmonary embolus (HCC) 01/18/2020   Right hand pain 07/29/2022   Screening for lung cancer 08/01/2023   Screening for osteoporosis 02/27/2017   Formatting of this note might be different from the original. 2019: spine 3.1, L femur 1.3   Seasonal allergic rhinitis due to pollen 07/22/2015   Sensation of fullness in both ears 08/01/2023   Severe obesity with body mass index (BMI) of 35.0 to 35.9 and comorbidity (HCC) 07/22/2015   Snores 07/22/2015   Last Assessment & Plan:  Relevant Hx: Course: Daily Update: Today's Plan:she was to have psg, but has not had this yet. Discussed that if this is present could be worse post-op with pain meds. None the less, she will have protective airway with/during surgery. She should still have psg study, but wants to wait until after knee repair  Electronically signed by: Glend[place] Dariel Sayres, FNP 07/24/15   Suprapubic pain 08/26/2023   Urinary frequency 05/01/2023   Vertigo 11/25/2021    Past Surgical History:  Procedure Laterality Date   ABDOMINAL HYSTERECTOMY     CARPAL TUNNEL RELEASE Right    CHOLECYSTECTOMY     ECTOPIC PREGNANCY SURGERY     X 2   FOOT SURGERY     Knee replcement     POLYPECTOMY     TONSILECTOMY/ADENOIDECTOMY WITH MYRINGOTOMY  1972   TOTAL HIP ARTHROPLASTY Right    WISDOM TOOTH EXTRACTION      Family History  Problem Relation Age of Onset   Dementia Mother    Heart attack Father    Heart  disease Father    Leukemia Half-Brother    Multiple myeloma Half-Brother    Diabetes Half-Sister    Breast cancer Neg Hx     Social History   Socioeconomic History   Marital status: Divorced    Spouse name: NONE   Number of children: 0   Years of education: 12 +  SOME [employer]   Highest education level: Some college, no degree  Occupational History   Occupation: DIABLED  Tobacco Use   Smoking status: Former    Current packs/day: 0.00    Types: Cigarettes    Quit date: 06/17/2015    Years since quitting: 8.7   Smokeless tobacco: Never  Vaping Use   Vaping status: Never Used  Substance and Sexual Activity   Alcohol use: Yes    Alcohol/week: 3.0 - 4.0 standard drinks of alcohol    Types: 3 - 4 Cans of beer per week    Comment: every other day   Drug use: No   Sexual activity: Yes    Partners: Male  Other Topics Concern   Not on file  Social History Narrative   Not on file   Social Drivers of Health   Financial Resource Strain: Low Risk  (08/13/2023)   Overall Financial Resource Strain (CARDIA)    Difficulty of Paying Living Expenses: Not hard at all  Recent Concern: Financial Resource Strain - High Risk (07/27/2023)   Overall Financial Resource Strain (CARDIA)    Difficulty of Paying Living Expenses: Hard  Food Insecurity: No Food Insecurity (08/13/2023)   Hunger Vital Sign    Worried About Running Out of Food in the Last Year: Never true    Ran Out of Food in the Last Year: Never true  Transportation Needs: No Transportation Needs (08/13/2023)   PRAPARE - Administrator, Civil Service (Medical): No    Lack of Transportation (Non-Medical): No  Physical Activity: Sufficiently Active (08/13/2023)   Exercise Vital Sign    Days of Exercise per Week: 3 days    Minutes of Exercise per Session: 60 min  Stress: Stress Concern Present (08/13/2023)   [employer] of Occupational Health - Occupational Stress Questionnaire    Feeling of Stress : To some extent   Social Connections: Moderately Isolated (08/13/2023)   Social Connection and Isolation Panel    Frequency of Communication with Friends and Family: More than three times a week    Frequency of Social Gatherings with Friends and Family: Once a week    Attends Religious Services: 1 to 4 times per year    Active Member of [employer] or Organizations: No    Attends Banker Meetings: Never    Marital Status: Divorced  Catering manager Violence: Not At Risk (08/13/2023)   Humiliation, Afraid, Rape, and Kick questionnaire    Fear of Current or Ex-Partner: No    Emotionally Abused: No    Physically Abused: No  Sexually Abused: No    Outpatient Medications Prior to Visit  Medication Sig Dispense Refill   albuterol  (VENTOLIN  HFA) 108 (90 Base) MCG/ACT inhaler Inhale 1-2 puffs into the lungs every 6 (six) hours as needed for wheezing or shortness of breath.     amLODipine  (NORVASC ) 10 MG tablet TAKE 1 TABLET BY MOUTH AS NEEDED FOR HIGH BLOOD PRESSURE 90 tablet 1   aspirin EC 81 MG tablet Take 81 mg by mouth daily. Swallow whole.     EPINEPHrine  0.3 mg/0.3 mL IJ SOAJ injection Inject 0.3 mg into the muscle as needed for anaphylaxis. 2 each 1   erythromycin  ophthalmic ointment Place a 1/4 inch ribbon of ointment into the lower eyelid twice daily x 5-7 days 3.5 g 0   ezetimibe  (ZETIA ) 10 MG tablet Take 1 tablet (10 mg total) by mouth daily. 90 tablet 1   fluticasone (FLONASE) 50 MCG/ACT nasal spray Place 1 spray into both nostrils daily.     Fluticasone-Umeclidin-Vilant (TRELEGY ELLIPTA ) 200-62.5-25 MCG/ACT AEPB Inhale 1 Inhalation into the lungs daily. 1 each 11   furosemide  (LASIX ) 20 MG tablet Take 1 tablet (20 mg total) by mouth as needed for fluid or edema. 90 tablet 0   ibuprofen  (ADVIL ) 600 MG tablet Take 1 tablet (600 mg total) by mouth every 6 (six) hours as needed. 30 tablet 0   Loratadine  10 MG CAPS Take 1 capsule (10 mg total) by mouth daily. 30 capsule 3   LORazepam  (ATIVAN ) 0.5  MG tablet Take 1 tablet (0.5 mg total) by mouth 2 (two) times daily as needed for anxiety. 30 tablet 1   Multiple Vitamin (MULTIVITAMIN) tablet Take 1 tablet by mouth daily.     nitroGLYCERIN  (NITROSTAT ) 0.4 MG SL tablet Place 1 tablet (0.4 mg total) under the tongue every 5 (five) minutes as needed for chest pain (x3 total. call ems if chest pain does not resolve after one, call ems.). 25 tablet 0   sertraline  (ZOLOFT ) 100 MG tablet TAKE 1 & 1/2 (ONE & ONE-HALF) TABLETS BY MOUTH ONCE DAILY 135 tablet 1   Albuterol -Budesonide (AIRSUPRA ) 90-80 MCG/ACT AERO Inhale 1-2 puffs into the lungs every 6 (six) hours as needed. (Patient not taking: Reported on 03/22/2024) 10.7 g 3   Bempedoic Acid-Ezetimibe  (NEXLIZET ) 180-10 MG TABS Take 1 tablet by mouth daily at 6 (six) AM. (Patient not taking: Reported on 03/22/2024) 90 tablet 1   mupirocin  ointment (BACTROBAN ) 2 % Apply 1 Application topically 2 (two) times daily. (Patient not taking: Reported on 03/22/2024) 22 g 0   No facility-administered medications prior to visit.    Allergies  Allergen Reactions   Demerol Anaphylaxis   Meperidine Anaphylaxis   Sulfamethoxazole Itching    With redness to skin    Citalopram Hydrobromide Other (See Comments)    Unknown   Hydrocodone    Hydrocodone-Acetaminophen Other (See Comments)    Unknown   Metoprolol Tartrate Other (See Comments)    Chest discomfort, palpations   Oxycodone-Acetaminophen Other (See Comments)    Unknown   Rosuvastatin  Other (See Comments)    myalgias   Statins     myalgias   Diltiazem Rash   Metoprolol Tartrate Palpitations    Review of Systems  Constitutional:  Negative for chills, diaphoresis, fatigue and fever.  HENT:  Negative for congestion, ear pain and sinus pain.   Eyes: Negative.   Respiratory:  Negative for cough and shortness of breath.   Cardiovascular:  Negative for chest pain.  Gastrointestinal:  Positive for  nausea. Negative for abdominal pain, constipation,  diarrhea and vomiting.  Endocrine: Negative.   Genitourinary:  Positive for dysuria, frequency and urgency.  Musculoskeletal:  Negative for arthralgias.  Allergic/Immunologic: Negative.   Neurological:  Negative for weakness and headaches.  Psychiatric/Behavioral:  Negative for dysphoric mood. The patient is not nervous/anxious.        Objective:        03/22/2024    2:36 PM 01/11/2024    3:51 PM 11/13/2023   11:30 AM  Vitals with BMI  Height 5' 3 5' 3   Weight 208 lbs 3 oz 206 lbs   BMI 36.89 36.5   Systolic 128 148 850  Diastolic 70 82 93  Pulse 68 94 70    No data found.   Physical Exam  Health Maintenance Due  Topic Date Due   Hepatitis B Vaccines 19-59 Average Risk (1 of 3 - 19+ 3-dose series) Never done   Zoster Vaccines- Shingrix (1 of 2) Never done   Cervical Cancer Screening (HPV/Pap Cotest)  12/14/2022   Colonoscopy  12/21/2023   Influenza Vaccine  01/15/2024       Topic Date Due   Hepatitis B Vaccines 19-59 Average Risk (1 of 3 - 19+ 3-dose series) Never done     Lab Results  Component Value Date   TSH 2.230 07/28/2023   Lab Results  Component Value Date   WBC 5.8 10/28/2023   HGB 14.1 10/28/2023   HCT 42.1 10/28/2023   MCV 100 (H) 10/28/2023   PLT 329 10/28/2023   Lab Results  Component Value Date   NA 143 10/28/2023   K 4.7 10/28/2023   CO2 25 10/28/2023   GLUCOSE 91 10/28/2023   BUN 8 10/28/2023   CREATININE 0.62 10/28/2023   BILITOT 0.5 10/28/2023   ALKPHOS 148 (H) 10/28/2023   AST 23 10/28/2023   ALT 15 10/28/2023   PROT 6.8 10/28/2023   ALBUMIN 4.5 10/28/2023   CALCIUM  9.4 10/28/2023   ANIONGAP 10 12/15/2022   EGFR 104 10/28/2023   Lab Results  Component Value Date   CHOL 269 (H) 10/28/2023   Lab Results  Component Value Date   HDL 108 10/28/2023   Lab Results  Component Value Date   LDLCALC 147 (H) 10/28/2023   Lab Results  Component Value Date   TRIG 86 10/28/2023   Lab Results  Component Value Date    CHOLHDL 2.5 10/28/2023   Lab Results  Component Value Date   HGBA1C 5.0 11/23/2020        Results for orders placed or performed in visit on 10/28/23  CBC with Differential/Platelet   Collection Time: 10/28/23  8:53 AM  Result Value Ref Range   WBC 5.8 3.4 - 10.8 x10E3/uL   RBC 4.23 3.77 - 5.28 x10E6/uL   Hemoglobin 14.1 11.1 - 15.9 g/dL   Hematocrit 57.8 65.9 - 46.6 %   MCV 100 (H) 79 - 97 fL   MCH 33.3 (H) 26.6 - 33.0 pg   MCHC 33.5 31.5 - 35.7 g/dL   RDW 87.8 88.2 - 84.5 %   Platelets 329 150 - 450 x10E3/uL   Neutrophils 62 Not Estab. %   Lymphs 28 Not Estab. %   Monocytes 8 Not Estab. %   Eos 1 Not Estab. %   Basos 1 Not Estab. %   Neutrophils Absolute 3.6 1.4 - 7.0 x10E3/uL   Lymphocytes Absolute 1.7 0.7 - 3.1 x10E3/uL   Monocytes Absolute 0.5 0.1 - 0.9 x10E3/uL  EOS (ABSOLUTE) 0.1 0.0 - 0.4 x10E3/uL   Basophils Absolute 0.1 0.0 - 0.2 x10E3/uL   Immature Granulocytes 0 Not Estab. %   Immature Grans (Abs) 0.0 0.0 - 0.1 x10E3/uL  Comprehensive metabolic panel with GFR   Collection Time: 10/28/23  8:53 AM  Result Value Ref Range   Glucose 91 70 - 99 mg/dL   BUN 8 6 - 24 mg/dL   Creatinine, Ser 9.37 0.57 - 1.00 mg/dL   eGFR 895 >40 fO/fpw/8.26   BUN/Creatinine Ratio 13 9 - 23   Sodium 143 134 - 144 mmol/L   Potassium 4.7 3.5 - 5.2 mmol/L   Chloride 101 96 - 106 mmol/L   CO2 25 20 - 29 mmol/L   Calcium  9.4 8.7 - 10.2 mg/dL   Total Protein 6.8 6.0 - 8.5 g/dL   Albumin 4.5 3.8 - 4.9 g/dL   Globulin, Total 2.3 1.5 - 4.5 g/dL   Bilirubin Total 0.5 0.0 - 1.2 mg/dL   Alkaline Phosphatase 148 (H) 44 - 121 IU/L   AST 23 0 - 40 IU/L   ALT 15 0 - 32 IU/L  Lipid panel   Collection Time: 10/28/23  8:53 AM  Result Value Ref Range   Cholesterol, Total 269 (H) 100 - 199 mg/dL   Triglycerides 86 0 - 149 mg/dL   HDL 891 >60 mg/dL   VLDL Cholesterol Cal 14 5 - 40 mg/dL   LDL Chol Calc (NIH) 852 (H) 0 - 99 mg/dL   Chol/HDL Ratio 2.5 0.0 - 4.4 ratio     Assessment &  Plan:   Assessment & Plan     Body mass index is 36.88 kg/m.SABRA  Assessment and Plan      No orders of the defined types were placed in this encounter.   No orders of the defined types were placed in this encounter.    Follow-up: No follow-ups on file.  An After Visit Summary was printed and given to the patient.  Harrie CHRISTELLA Cedar, FNP [facility] [phone]

## 2024-03-22 NOTE — Telephone Encounter (Signed)
 FYI Only or Action Required?: FYI only for provider.  Patient was last seen in primary care on 01/11/2024 by Sirivol, Mamatha, MD.  Called Nurse Triage reporting Urine Odor.  Symptoms began a week ago.  Interventions attempted: Other: Hydrating.  Symptoms are: unchanged.  Triage Disposition: See Physician Within 24 Hours  Patient/caregiver understands and will follow disposition?: Yes  Reason for Disposition  Bad or foul-smelling urine  Answer Assessment - Initial Assessment Questions Been trying to drink a lot of water. Reports hx with kidney issues.   1. SYMPTOM: What's the main symptom you're concerned about? (e.g., frequency, incontinence)     UTI  2. ONSET: When did the  UTI  start?     About a week  3. PAIN: Is there any pain? If Yes, ask: How bad is it? (Scale: 1-10; mild, moderate, severe)     Denies burning with urination  4. CAUSE: What do you think is causing the symptoms?     UTI  5. OTHER SYMPTOMS: Do you have any other symptoms? (e.g., blood in urine, fever, flank pain, pain with urination)     Pressure on kidneys, back pain, feels like has to urinate but can't go  Protocols used: Urinary Symptoms-A-AH  Message from [facility] G sent at 03/22/2024 11:48 AM EDT  uti? Hurting in back, odor when she pees.. she took a home test showing positive results

## 2024-03-22 NOTE — Assessment & Plan Note (Addendum)
 Well controlled - Continue amlodipine  10 mg by mouth as needed for high blood pressure - Order refill on Lasix  20 mg by mouth as needed for edema   Orders:   furosemide  (LASIX ) 20 MG tablet; Take 1 tablet (20 mg total) by mouth as needed for fluid or edema.

## 2024-03-22 NOTE — Progress Notes (Signed)
 Acute Office Visit  Subjective:    Patient ID: [name], female    DOB: November 22, 1965, 58 y.o.   MRN: [id number]  Chief Complaint  Patient presents with   Dysuria    Discussed the use of AI scribe software for clinical note transcription with the patient, who gave verbal consent to proceed.  History of Present Illness   [name] is a 58 year old female with a history of kidney issues following COVID pneumonia who presents with abdominal pain and nausea.  Abdominal pain and nausea - Abdominal pain and nausea present for the past couple of weeks - Pain described as a sensation of pressure, particularly around the kidneys - Associated with lack of appetite and decreased oral intake - No current urinary tract infection or yeast infection  Renal dysfunction - History of kidney issues following COVID pneumonia - Previous treatment for COVID pneumonia nearly resulted in kidney shutdown  Antibiotic use and adverse reactions - History of antibiotic use for dental procedures - Previous use of Macrobid and Bactrim without side effects - Antibiotics have previously caused yeast infections  Recent environmental changes - Recent changes in detergent or body soap. - Concerned that a new bar of soap she bought from [employer] has caused recent issues.   Impact on daily life - Currently preparing for a cruise at the end of the month - Concerned that symptoms may affect travel plans           Past Medical History:  Diagnosis Date   Allergic reaction to bee sting 09/09/2018   Anxiety    Asthma    Bacterial vaginosis 07/07/2019   Bulging lumbar disc 07/22/2015   Cardiomyopathy (HCC) 07/18/2015   Overview:  Overview:  [place] 2009, normal coronaries  Last Assessment & Plan:  Relevant Hx: Course: Daily Update: Today's Plan: she has recently been seen by cards and is planned to have echo this Friday  Electronically signed by: [place] Dariel Sayres, FNP 07/24/15 1042   Carpal  tunnel syndrome 07/22/2015   Cervical strain 01/11/2019   Chronic fatigue 07/22/2015   Chronic rhinitis 07/22/2015   Depression    Dyslipidemia 07/18/2015   Esophageal stricture 07/22/2015   Essential hypertension    Eustachian tube dysfunction 07/22/2015   Excessive daytime sleepiness 05/16/2019   Gastro-esophageal reflux disease without esophagitis    Gastroesophageal reflux disease without esophagitis 07/18/2015   Last Assessment & Plan:  Relevant Hx: Course: Daily Update: Today's Plan:appears stable with ppi  Electronically signed by: [place] Dariel Sayres, FNP 07/24/15 1043   High risk medication use 07/22/2015   History of blood clots    Hot flash, menopausal 07/22/2015   Hyperlipidemia 07/22/2015   Last Assessment & Plan:  Relevant Hx: Course: Daily Update: Today's Plan:stable with meds  Electronically signed by: [place] Dariel Sayres, FNP 07/24/15 1112   Hypertension    Hypertensive heart disease with heart failure (HCC) 04/20/2023   Localized swelling of both lower legs 07/22/2015   Localized swelling of both lower legs 07/22/2015   Low back strain, initial encounter 07/07/2019   Lung mass    Lung nodules    Mixed hyperlipidemia 07/22/2015   Last Assessment & Plan:  Formatting of this note might be different from the original. Relevant Hx: Course: Daily Update: Today's Plan:stable with meds  Electronically signed by: [place] Dariel Sayres, FNP 07/24/15 1112   Morbid obesity (HCC) 07/22/2015   Other insomnia 11/22/2018   Pneumonia due to COVID-19 virus 01/17/2020  Primary osteoarthritis involving multiple joints 05/15/2016   Prophylactic antibiotic 08/01/2023   Pulmonary embolus (HCC) 01/18/2020   Right hand pain 07/29/2022   Screening for lung cancer 08/01/2023   Screening for osteoporosis 02/27/2017   Formatting of this note might be different from the original. 2019: spine 3.1, L femur 1.3   Seasonal allergic rhinitis due to pollen 07/22/2015   Sensation of  fullness in both ears 08/01/2023   Severe obesity with body mass index (BMI) of 35.0 to 35.9 and comorbidity (HCC) 07/22/2015   Snores 07/22/2015   Last Assessment & Plan:  Relevant Hx: Course: Daily Update: Today's Plan:she was to have psg, but has not had this yet. Discussed that if this is present could be worse post-op with pain meds. None the less, she will have protective airway with/during surgery. She should still have psg study, but wants to wait until after knee repair  Electronically signed by: [place] Dariel Sayres, FNP 07/24/15   Suprapubic pain 08/26/2023   Urinary frequency 05/01/2023   Vertigo 11/25/2021    Past Surgical History:  Procedure Laterality Date   ABDOMINAL HYSTERECTOMY     CARPAL TUNNEL RELEASE Right    CHOLECYSTECTOMY     ECTOPIC PREGNANCY SURGERY     X 2   FOOT SURGERY     Knee replcement     POLYPECTOMY     TONSILECTOMY/ADENOIDECTOMY WITH MYRINGOTOMY  1972   TOTAL HIP ARTHROPLASTY Right    WISDOM TOOTH EXTRACTION      Family History  Problem Relation Age of Onset   Dementia Mother    Heart attack Father    Heart disease Father    Leukemia Half-Brother    Multiple myeloma Half-Brother    Diabetes Half-Sister    Breast cancer Neg Hx     Social History   Socioeconomic History   Marital status: Divorced    Spouse name: NONE   Number of children: 0   Years of education: 12 +  SOME [employer]   Highest education level: Some college, no degree  Occupational History   Occupation: DIABLED  Tobacco Use   Smoking status: Former    Current packs/day: 0.00    Types: Cigarettes    Quit date: 06/17/2015    Years since quitting: 8.7   Smokeless tobacco: Never  Vaping Use   Vaping status: Never Used  Substance and Sexual Activity   Alcohol use: Yes    Alcohol/week: 3.0 - 4.0 standard drinks of alcohol    Types: 3 - 4 Cans of beer per week    Comment: every other day   Drug use: No   Sexual activity: Yes    Partners: Male  Other Topics Concern    Not on file  Social History Narrative   Not on file   Social Drivers of Health   Financial Resource Strain: Low Risk  (08/13/2023)   Overall Financial Resource Strain (CARDIA)    Difficulty of Paying Living Expenses: Not hard at all  Recent Concern: Financial Resource Strain - High Risk (07/27/2023)   Overall Financial Resource Strain (CARDIA)    Difficulty of Paying Living Expenses: Hard  Food Insecurity: No Food Insecurity (08/13/2023)   Hunger Vital Sign    Worried About Running Out of Food in the Last Year: Never true    Ran Out of Food in the Last Year: Never true  Transportation Needs: No Transportation Needs (08/13/2023)   PRAPARE - Administrator, Civil Service (Medical): No  Lack of Transportation (Non-Medical): No  Physical Activity: Sufficiently Active (08/13/2023)   Exercise Vital Sign    Days of Exercise per Week: 3 days    Minutes of Exercise per Session: 60 min  Stress: Stress Concern Present (08/13/2023)   [employer] of Occupational Health - Occupational Stress Questionnaire    Feeling of Stress : To some extent  Social Connections: Moderately Isolated (08/13/2023)   Social Connection and Isolation Panel    Frequency of Communication with Friends and Family: More than three times a week    Frequency of Social Gatherings with Friends and Family: Once a week    Attends Religious Services: 1 to 4 times per year    Active Member of [employer] or Organizations: No    Attends Banker Meetings: Never    Marital Status: Divorced  Catering manager Violence: Not At Risk (08/13/2023)   Humiliation, Afraid, Rape, and Kick questionnaire    Fear of Current or Ex-Partner: No    Emotionally Abused: No    Physically Abused: No    Sexually Abused: No    Outpatient Medications Prior to Visit  Medication Sig Dispense Refill   albuterol  (VENTOLIN  HFA) 108 (90 Base) MCG/ACT inhaler Inhale 1-2 puffs into the lungs every 6 (six) hours as needed for wheezing  or shortness of breath.     amLODipine  (NORVASC ) 10 MG tablet TAKE 1 TABLET BY MOUTH AS NEEDED FOR HIGH BLOOD PRESSURE 90 tablet 1   aspirin EC 81 MG tablet Take 81 mg by mouth daily. Swallow whole.     EPINEPHrine  0.3 mg/0.3 mL IJ SOAJ injection Inject 0.3 mg into the muscle as needed for anaphylaxis. 2 each 1   erythromycin  ophthalmic ointment Place a 1/4 inch ribbon of ointment into the lower eyelid twice daily x 5-7 days 3.5 g 0   ezetimibe  (ZETIA ) 10 MG tablet Take 1 tablet (10 mg total) by mouth daily. 90 tablet 1   fluticasone (FLONASE) 50 MCG/ACT nasal spray Place 1 spray into both nostrils daily.     Fluticasone-Umeclidin-Vilant (TRELEGY ELLIPTA ) 200-62.5-25 MCG/ACT AEPB Inhale 1 Inhalation into the lungs daily. 1 each 11   ibuprofen  (ADVIL ) 600 MG tablet Take 1 tablet (600 mg total) by mouth every 6 (six) hours as needed. 30 tablet 0   Loratadine  10 MG CAPS Take 1 capsule (10 mg total) by mouth daily. 30 capsule 3   LORazepam  (ATIVAN ) 0.5 MG tablet Take 1 tablet (0.5 mg total) by mouth 2 (two) times daily as needed for anxiety. 30 tablet 1   Multiple Vitamin (MULTIVITAMIN) tablet Take 1 tablet by mouth daily.     nitroGLYCERIN  (NITROSTAT ) 0.4 MG SL tablet Place 1 tablet (0.4 mg total) under the tongue every 5 (five) minutes as needed for chest pain (x3 total. call ems if chest pain does not resolve after one, call ems.). 25 tablet 0   sertraline  (ZOLOFT ) 100 MG tablet TAKE 1 & 1/2 (ONE & ONE-HALF) TABLETS BY MOUTH ONCE DAILY 135 tablet 1   furosemide  (LASIX ) 20 MG tablet Take 1 tablet (20 mg total) by mouth as needed for fluid or edema. 90 tablet 0   Albuterol -Budesonide (AIRSUPRA ) 90-80 MCG/ACT AERO Inhale 1-2 puffs into the lungs every 6 (six) hours as needed. (Patient not taking: Reported on 03/22/2024) 10.7 g 3   Bempedoic Acid-Ezetimibe  (NEXLIZET ) 180-10 MG TABS Take 1 tablet by mouth daily at 6 (six) AM. (Patient not taking: Reported on 03/22/2024) 90 tablet 1   mupirocin  ointment  (BACTROBAN )  2 % Apply 1 Application topically 2 (two) times daily. (Patient not taking: Reported on 03/22/2024) 22 g 0   No facility-administered medications prior to visit.    Allergies  Allergen Reactions   Demerol Anaphylaxis   Meperidine Anaphylaxis   Sulfamethoxazole Itching    With redness to skin    Citalopram Hydrobromide Other (See Comments)    Unknown   Hydrocodone    Hydrocodone-Acetaminophen Other (See Comments)    Unknown   Metoprolol Tartrate Other (See Comments)    Chest discomfort, palpations   Oxycodone-Acetaminophen Other (See Comments)    Unknown   Rosuvastatin  Other (See Comments)    myalgias   Statins     myalgias   Diltiazem Rash   Metoprolol Tartrate Palpitations    Review of Systems  Constitutional:  Negative for appetite change, chills, diaphoresis, fatigue and fever.  HENT:  Negative for congestion, ear pain, sinus pressure, sinus pain and sore throat.   Respiratory:  Negative for cough, chest tightness, shortness of breath and wheezing.   Cardiovascular:  Negative for chest pain and palpitations.  Gastrointestinal:  Negative for abdominal pain, constipation, diarrhea, nausea and vomiting.  Genitourinary:  Positive for dysuria, flank pain, frequency and urgency. Negative for hematuria.  Musculoskeletal:  Negative for arthralgias, back pain, joint swelling and myalgias.  Skin:  Negative for rash.  Neurological:  Negative for dizziness, weakness and headaches.  Psychiatric/Behavioral:  Negative for dysphoric mood. The patient is not nervous/anxious.        Objective:        03/22/2024    2:36 PM 01/11/2024    3:51 PM 11/13/2023   11:30 AM  Vitals with BMI  Height 5' 3 5' 3   Weight 208 lbs 3 oz 206 lbs   BMI 36.89 36.5   Systolic 128 148 850  Diastolic 70 82 93  Pulse 68 94 70    No data found.   Physical Exam Vitals reviewed.  Constitutional:      General: She is not in acute distress.    Appearance: Normal appearance. She is  obese. She is not ill-appearing.  Eyes:     Conjunctiva/sclera: Conjunctivae normal.  Cardiovascular:     Rate and Rhythm: Normal rate and regular rhythm.     Heart sounds: Normal heart sounds. No murmur heard. Pulmonary:     Effort: Pulmonary effort is normal.     Breath sounds: Normal breath sounds. No wheezing.  Abdominal:     General: Bowel sounds are normal.     Palpations: Abdomen is soft.     Tenderness: There is abdominal tenderness (suprapubic tenderness).  Musculoskeletal:        General: Normal range of motion.     Lumbar back: Tenderness present.  Skin:    General: Skin is warm.  Neurological:     Mental Status: She is alert and oriented to person, place, and time. Mental status is at baseline.  Psychiatric:        Mood and Affect: Mood normal.        Behavior: Behavior normal.     Health Maintenance Due  Topic Date Due   Hepatitis B Vaccines 19-59 Average Risk (1 of 3 - 19+ 3-dose series) Never done   Zoster Vaccines- Shingrix (1 of 2) Never done   Cervical Cancer Screening (HPV/Pap Cotest)  12/14/2022   Colonoscopy  12/21/2023   Influenza Vaccine  01/15/2024       Topic Date Due   Hepatitis B Vaccines 19-59  Average Risk (1 of 3 - 19+ 3-dose series) Never done     Lab Results  Component Value Date   TSH 2.230 07/28/2023   Lab Results  Component Value Date   WBC 5.8 10/28/2023   HGB 14.1 10/28/2023   HCT 42.1 10/28/2023   MCV 100 (H) 10/28/2023   PLT 329 10/28/2023   Lab Results  Component Value Date   NA 143 10/28/2023   K 4.7 10/28/2023   CO2 25 10/28/2023   GLUCOSE 91 10/28/2023   BUN 8 10/28/2023   CREATININE 0.62 10/28/2023   BILITOT 0.5 10/28/2023   ALKPHOS 148 (H) 10/28/2023   AST 23 10/28/2023   ALT 15 10/28/2023   PROT 6.8 10/28/2023   ALBUMIN 4.5 10/28/2023   CALCIUM  9.4 10/28/2023   ANIONGAP 10 12/15/2022   EGFR 104 10/28/2023   Lab Results  Component Value Date   CHOL 269 (H) 10/28/2023   Lab Results  Component Value  Date   HDL 108 10/28/2023   Lab Results  Component Value Date   LDLCALC 147 (H) 10/28/2023   Lab Results  Component Value Date   TRIG 86 10/28/2023   Lab Results  Component Value Date   CHOLHDL 2.5 10/28/2023   Lab Results  Component Value Date   HGBA1C 5.0 11/23/2020        Results for orders placed or performed in visit on 03/22/24  POCT URINALYSIS DIP (CLINITEK)   Collection Time: 03/22/24  2:53 PM  Result Value Ref Range   Color, UA yellow yellow   Clarity, UA clear clear   Glucose, UA negative negative mg/dL   Bilirubin, UA negative negative   Ketones, POC UA negative negative mg/dL   Spec Grav, UA <=8.994 (A) 1.010 - 1.025   Blood, UA negative negative   pH, UA 7.5 5.0 - 8.0   POC PROTEIN,UA negative negative, trace   Urobilinogen, UA 0.2 0.2 or 1.0 E.U./dL   Nitrite, UA Negative Negative   Leukocytes, UA Negative Negative     Assessment & Plan:   Assessment & Plan Acute cystitis without hematuria UTI Patient here with symptoms of dysuria started yesterday. UA at home was positive. UA in the office showed Lab Results  Component Value Date   COLORU yellow 03/22/2024   CLARITYU clear 03/22/2024   GLUCOSEUR negative 03/22/2024   BILIRUBINUR negative 03/22/2024   SPECGRAV <=1.005 (A) 03/22/2024   RBCUR negative 03/22/2024   PHUR 7.5 03/22/2024   PROTEINUR NEGATIVE 12/15/2022   UROBILINOGEN 0.2 03/22/2024   LEUKOCYTESUR Negative 03/22/2024    Suprapubic tenderness noted on exam.  She does not appear septic, has bilateral flank tenderness.  Vitals reassuring.   Plan: Symptoms suggestive of urinary tract infection. I have prescribed Macrobid 100 mg by mouth TWICE A DAY for 5 days. Your symptoms should gradually improve. Call if the burning worsens, you develop a fever, back pain or pelvic pain or if your symptom do not resolve after completing the antibiotic. Drink plenty of fluids Complete the full course of antibiotics even if the symptoms  resolve Remember to wipe from front to back and don't hold it in! If possible, empty your bladder every 4 hours. Advised to watch out for any fever/chills/back pain and to go to the emergency room if she does for evaluation for pyelonephritis and IV fluids and IV antibiotics.      Essential hypertension Well controlled - Continue amlodipine  10 mg by mouth as needed for high blood pressure - Order refill  on Lasix  20 mg by mouth as needed for edema   Orders:   furosemide  (LASIX ) 20 MG tablet; Take 1 tablet (20 mg total) by mouth as needed for fluid or edema.  Dysuria - UA done in the office was negative,patient symptomatic with recent change in soap. - Order urine culture  Orders:   POCT URINALYSIS DIP (CLINITEK)   Urine Culture   nitrofurantoin, macrocrystal-monohydrate, (MACROBID) 100 MG capsule; Take 1 capsule (100 mg total) by mouth 2 (two) times daily for 5 days.   fluconazole  (DIFLUCAN ) 150 MG tablet; Take 1 tablet (150 mg total) by mouth daily for 1 dose.     Follow-up: Return if symptoms worsen or fail to improve.  An After Visit Summary was printed and given to the patient.   I,Lauren M Auman,acting as a scribe for Harrie CHRISTELLA Cedar, FNP.,have documented all relevant documentation on the behalf of Harrie CHRISTELLA Cedar, FNP,as directed by  Harrie CHRISTELLA Cedar, FNP while in the presence of Harrie CHRISTELLA Cedar, FNP.    I attest that I have reviewed this visit and agree with the plan scribed by my staff.   Harrie CHRISTELLA Cedar, FNP [facility] [phone]

## 2024-03-23 DIAGNOSIS — R300 Dysuria: Secondary | ICD-10-CM | POA: Diagnosis not present

## 2024-03-24 LAB — SPECIMEN STATUS REPORT

## 2024-03-24 LAB — URINE CULTURE

## 2024-03-27 ENCOUNTER — Ambulatory Visit: Payer: Self-pay | Admitting: Family Medicine

## 2024-03-29 ENCOUNTER — Other Ambulatory Visit: Payer: Self-pay | Admitting: Family Medicine

## 2024-03-29 DIAGNOSIS — Z1231 Encounter for screening mammogram for malignant neoplasm of breast: Secondary | ICD-10-CM

## 2024-04-01 ENCOUNTER — Inpatient Hospital Stay: Admission: RE | Admit: 2024-04-01 | Payer: Medicare HMO | Source: Ambulatory Visit

## 2024-05-10 ENCOUNTER — Ambulatory Visit
Admission: RE | Admit: 2024-05-10 | Discharge: 2024-05-10 | Disposition: A | Discharge status: Home / Self Care | Source: Ambulatory Visit | Attending: Acute Care | Admitting: Acute Care

## 2024-05-10 DIAGNOSIS — Z1231 Encounter for screening mammogram for malignant neoplasm of breast: Secondary | ICD-10-CM

## 2024-06-15 ENCOUNTER — Other Ambulatory Visit: Payer: Self-pay | Admitting: Family Medicine

## 2024-07-15 ENCOUNTER — Ambulatory Visit (HOSPITAL_BASED_OUTPATIENT_CLINIC_OR_DEPARTMENT_OTHER)

## 2024-08-15 ENCOUNTER — Ambulatory Visit: Admitting: Family Medicine

## 2024-08-16 ENCOUNTER — Ambulatory Visit

## 2024-08-26 ENCOUNTER — Ambulatory Visit: Payer: Self-pay | Admitting: Cardiology
